# Patient Record
Sex: Male | Born: 1945 | Race: Black or African American | Hispanic: No | Marital: Married | Smoking: Former smoker
Health system: Southern US, Community
[De-identification: ages and names within clinical notes are randomized; demographics above are authoritative.]

## PROBLEM LIST (undated history)

## (undated) DIAGNOSIS — R7303 Prediabetes: Secondary | ICD-10-CM

## (undated) DIAGNOSIS — M549 Dorsalgia, unspecified: Secondary | ICD-10-CM

## (undated) DIAGNOSIS — Z972 Presence of dental prosthetic device (complete) (partial): Secondary | ICD-10-CM

## (undated) DIAGNOSIS — R12 Heartburn: Secondary | ICD-10-CM

## (undated) DIAGNOSIS — K219 Gastro-esophageal reflux disease without esophagitis: Secondary | ICD-10-CM

## (undated) DIAGNOSIS — N400 Enlarged prostate without lower urinary tract symptoms: Secondary | ICD-10-CM

## (undated) DIAGNOSIS — I10 Essential (primary) hypertension: Secondary | ICD-10-CM

## (undated) DIAGNOSIS — Z8249 Family history of ischemic heart disease and other diseases of the circulatory system: Secondary | ICD-10-CM

## (undated) DIAGNOSIS — N210 Calculus in bladder: Secondary | ICD-10-CM

## (undated) DIAGNOSIS — E785 Hyperlipidemia, unspecified: Secondary | ICD-10-CM

## (undated) DIAGNOSIS — E669 Obesity, unspecified: Secondary | ICD-10-CM

## (undated) DIAGNOSIS — E8881 Metabolic syndrome: Secondary | ICD-10-CM

## (undated) HISTORY — DX: Essential (primary) hypertension: I10

## (undated) HISTORY — PX: BACK SURGERY: SHX140

## (undated) HISTORY — DX: Dorsalgia, unspecified: M54.9

## (undated) HISTORY — PX: EYE SURGERY: SHX253

## (undated) HISTORY — DX: Hyperlipidemia, unspecified: E78.5

## (undated) HISTORY — DX: Heartburn: R12

## (undated) HISTORY — DX: Obesity, unspecified: E66.9

## (undated) HISTORY — DX: Metabolic syndrome: E88.81

---

## 1998-02-09 ENCOUNTER — Ambulatory Visit (HOSPITAL_COMMUNITY): Admission: RE | Admit: 1998-02-09 | Discharge: 1998-02-09 | Payer: Self-pay | Admitting: Internal Medicine

## 1998-03-23 ENCOUNTER — Ambulatory Visit (HOSPITAL_COMMUNITY): Admission: RE | Admit: 1998-03-23 | Discharge: 1998-03-23 | Payer: Self-pay | Admitting: Internal Medicine

## 1998-12-12 ENCOUNTER — Inpatient Hospital Stay (HOSPITAL_COMMUNITY): Admission: AD | Admit: 1998-12-12 | Discharge: 1998-12-13 | Payer: Self-pay | Admitting: Cardiovascular Disease

## 1998-12-13 ENCOUNTER — Encounter: Payer: Self-pay | Admitting: Cardiovascular Disease

## 2003-08-19 ENCOUNTER — Encounter: Admission: RE | Admit: 2003-08-19 | Discharge: 2003-08-19 | Payer: Self-pay | Admitting: Internal Medicine

## 2004-03-14 ENCOUNTER — Inpatient Hospital Stay (HOSPITAL_COMMUNITY): Admission: RE | Admit: 2004-03-14 | Discharge: 2004-03-17 | Payer: Self-pay | Admitting: Neurosurgery

## 2004-03-14 HISTORY — PX: LUMBAR FUSION: SHX111

## 2009-04-17 ENCOUNTER — Encounter: Admission: RE | Admit: 2009-04-17 | Discharge: 2009-04-17 | Payer: Self-pay | Admitting: Internal Medicine

## 2011-03-08 NOTE — Discharge Summary (Signed)
NAME:  Tyler [name], PETRUCELLI NO.:  [id number]   MEDICAL RECORD NO.:  [id number]                   PATIENT TYPE:  INP   LOCATION:  3028                                 FACILITY:  [facility]   PHYSICIAN:  Coletta Memos, M.D.                  DATE OF BIRTH:  [birth year]   DATE OF ADMISSION:  03/14/2004  DATE OF DISCHARGE:  03/17/2004                                 DISCHARGE SUMMARY   ADMITTING DIAGNOSES:  1. Lumbar spondylolisthesis, L5-S1.  2. L5 spondylolysis.  3. Lumbar degenerative disk disease.  4. Lumbar radiculopathy.   POSTOPERATIVE DIAGNOSES:  1. Lumbar spondylolisthesis, L5-S1.  2. L5 spondylolysis.  3. Lumbar degenerative disk disease.  4. Lumbar radiculopathy.   PROCEDURE:  L5-S1 posterior lumbar interbody fusion, posterolateral  arthrodesis, pedicle screw fixation.   COMPLICATIONS:  None.   DISCHARGE STATUS:  Alive and well.   MEDICATIONS:  1. Percocet 1-2 p.o. q.4h. p.r.n. pain.  2. Flexeril 10 mg p.o. t.i.d. p.r.n. pain.   INSTRUCTIONS GIVEN TO PATIENT:  Do not drive for 10 days.  He was instructed  to call my office for return appointment in 3-4 weeks.  Patient is  ambulating.  Wound is clean and dry without signs of infection.  He is  tolerating a regular diet and is voiding at discharge.                                                Coletta Memos, M.D.    KC/MEDQ  D:  03/17/2004  T:  03/18/2004  Job:  [id number]

## 2011-03-08 NOTE — Op Note (Signed)
NAME:  Tyler [name], Tyler [name]                   ACCOUNT NO.:  [id number]   MEDICAL RECORD NO.:  [id number]                   PATIENT TYPE:  INP   LOCATION:  2889                                 FACILITY:  [facility]   PHYSICIAN:  Coletta Memos, M.D.                  DATE OF BIRTH:  05-Aug-1946   DATE OF PROCEDURE:  03/14/2004  DATE OF DISCHARGE:                                 OPERATIVE REPORT   PREOPERATIVE DIAGNOSIS:  L5 spondylolisthesis, L5 spondylolysis, lumbar  radiculopathy, degenerative disk disease, L5-S1.   POSTOPERATIVE DIAGNOSIS:  L5 spondylolisthesis, L5 spondylolysis, lumbar  radiculopathy, degenerative disk disease, L5-S1.   OPERATION PERFORMED:  1. L5 Gill procedure.  2. Posterolateral arthrodesis with pedicle screw fixation, Synthes Legacy     system, one 6.5 x 45 mm screw on the right side at L5, one 5.5 x 45 mm     screw left L5, two 6.5 x 40 mm screws at S1.  Posterior lumbar interbody     arthrodesis with two 13 mm Synthes PEAK cages, morcelized auto and     allograft.   SURGEON:  Coletta Memos, M.D.   ASSISTANT:  Cristi Loron, M.D.   ANESTHESIA:  General.   INDICATIONS FOR PROCEDURE:  Fayrene Fearing Savoca presented with a long  history of back and lower extremity pain.  He says the pain has been getting  worse over a number of years.  He has a pars defect at L5 and a  spondylolisthesis grade 1 at L5 on S1.  I recommended and he agreed to  undergo operative decompression.   DESCRIPTION OF PROCEDURE:  Mr. Grisby was brought to the operating  room intubated and placed under general anesthetic.  His incision was  prepped and he was draped in sterile fashion.  Using preoperative localizing  film I infiltrated 20 mL 0.5% lidocaine 1:200,000 strength epinephrine.  I  opened the skin with a #10 blade and took this down to the thoracolumbar  fascia.  I then exposed L5-S1, the lamina of L3, spinous processes of L2.  I  performed a Gill procedure at L5 after  placing self-retaining retractors.  I  then decompressed both neural foramina to expose and make the egress widely  patent for the L5 nerve roots bilaterally.  Once that was done, I then  placed two 13 mm PLIF cages after performing bilateral diskectomies and  preparing the disk space for PLIF.  Morcelized auto and allograft was then  placed into the disk space after the 13 mm cages were placed.  Fluoroscopy  showed those to be in good position.  Then using a three dimensional  navigating system, which was integrated with fluoroscopy, four pedicle  screws were placed, two in L5, two in S1.  Dr. Lovell Sheehan assisted with this.  After that was done, an x-ray was taken showing that all screws were in good  position.  They were connected by 30 mm rods.  The  rods were then tightened  and locked into position.  I then irrigated the wound.  I then closed the  wound in layered fashion using Vicryl sutures.  Dermabond was used for a  sterile dressing.  There was some oozing afterwards so another dressing was  used on top of the Dermabond.                                              Coletta Memos, M.D.   KC/MEDQ  D:  03/14/2004  T:  03/15/2004  Job:  [id number]

## 2012-02-27 LAB — BASIC METABOLIC PANEL
BUN: 15 mg/dL (ref 4–21)
Potassium: 4.2 mmol/L (ref 3.4–5.3)
Sodium: 142 mmol/L (ref 137–147)

## 2012-10-21 HISTORY — PX: CATARACT EXTRACTION W/ INTRAOCULAR LENS IMPLANT: SHX1309

## 2012-12-02 ENCOUNTER — Encounter: Payer: Self-pay | Admitting: Hematology

## 2012-12-02 DIAGNOSIS — E669 Obesity, unspecified: Secondary | ICD-10-CM

## 2012-12-02 DIAGNOSIS — I10 Essential (primary) hypertension: Secondary | ICD-10-CM

## 2012-12-02 DIAGNOSIS — E88819 Insulin resistance, unspecified: Secondary | ICD-10-CM

## 2012-12-02 DIAGNOSIS — E785 Hyperlipidemia, unspecified: Secondary | ICD-10-CM

## 2012-12-02 DIAGNOSIS — R12 Heartburn: Secondary | ICD-10-CM

## 2012-12-02 DIAGNOSIS — E8881 Metabolic syndrome: Secondary | ICD-10-CM | POA: Insufficient documentation

## 2015-04-07 ENCOUNTER — Ambulatory Visit
Admission: RE | Admit: 2015-04-07 | Discharge: 2015-04-07 | Disposition: A | Discharge status: Home / Self Care | Payer: Self-pay | Source: Ambulatory Visit | Attending: Internal Medicine | Admitting: Internal Medicine

## 2015-04-07 ENCOUNTER — Other Ambulatory Visit: Payer: Self-pay | Admitting: Internal Medicine

## 2015-04-07 DIAGNOSIS — M79606 Pain in leg, unspecified: Secondary | ICD-10-CM

## 2015-04-07 DIAGNOSIS — M25552 Pain in left hip: Secondary | ICD-10-CM

## 2015-04-07 DIAGNOSIS — W19XXXA Unspecified fall, initial encounter: Secondary | ICD-10-CM

## 2015-04-28 ENCOUNTER — Other Ambulatory Visit: Payer: Self-pay | Admitting: Neurosurgery

## 2015-04-28 DIAGNOSIS — M545 Low back pain, unspecified: Secondary | ICD-10-CM

## 2015-05-10 ENCOUNTER — Ambulatory Visit
Admission: RE | Admit: 2015-05-10 | Discharge: 2015-05-10 | Disposition: A | Discharge status: Home / Self Care | Payer: Medicare Other | Source: Ambulatory Visit | Attending: Neurosurgery | Admitting: Neurosurgery

## 2015-05-10 DIAGNOSIS — M545 Low back pain, unspecified: Secondary | ICD-10-CM

## 2015-05-10 MED ORDER — GADOBENATE DIMEGLUMINE 529 MG/ML IV SOLN
20.0000 mL | Freq: Once | INTRAVENOUS | Status: AC | PRN
  Administered 2015-05-10: 20 mL via INTRAVENOUS

## 2015-05-11 ENCOUNTER — Other Ambulatory Visit: Payer: Self-pay | Admitting: Neurosurgery

## 2015-05-17 ENCOUNTER — Other Ambulatory Visit (HOSPITAL_COMMUNITY): Payer: Self-pay | Admitting: *Deleted

## 2015-05-17 ENCOUNTER — Encounter (HOSPITAL_COMMUNITY)
Admission: RE | Admit: 2015-05-17 | Discharge: 2015-05-17 | Disposition: A | Discharge status: Home / Self Care | Payer: Medicare Other | Source: Ambulatory Visit | Attending: Neurosurgery | Admitting: Neurosurgery

## 2015-05-17 ENCOUNTER — Encounter (HOSPITAL_COMMUNITY): Payer: Self-pay

## 2015-05-17 DIAGNOSIS — M79672 Pain in left foot: Secondary | ICD-10-CM | POA: Diagnosis not present

## 2015-05-17 DIAGNOSIS — Z981 Arthrodesis status: Secondary | ICD-10-CM | POA: Diagnosis not present

## 2015-05-17 DIAGNOSIS — Z79899 Other long term (current) drug therapy: Secondary | ICD-10-CM | POA: Diagnosis not present

## 2015-05-17 DIAGNOSIS — E785 Hyperlipidemia, unspecified: Secondary | ICD-10-CM | POA: Diagnosis not present

## 2015-05-17 DIAGNOSIS — I10 Essential (primary) hypertension: Secondary | ICD-10-CM | POA: Diagnosis not present

## 2015-05-17 DIAGNOSIS — Z6838 Body mass index (BMI) 38.0-38.9, adult: Secondary | ICD-10-CM | POA: Diagnosis not present

## 2015-05-17 DIAGNOSIS — M549 Dorsalgia, unspecified: Secondary | ICD-10-CM | POA: Diagnosis not present

## 2015-05-17 DIAGNOSIS — Z87891 Personal history of nicotine dependence: Secondary | ICD-10-CM | POA: Diagnosis not present

## 2015-05-17 DIAGNOSIS — M5126 Other intervertebral disc displacement, lumbar region: Secondary | ICD-10-CM | POA: Diagnosis not present

## 2015-05-17 LAB — BASIC METABOLIC PANEL
Anion gap: 8 (ref 5–15)
BUN: 11 mg/dL (ref 6–20)
CHLORIDE: 107 mmol/L (ref 101–111)
CO2: 26 mmol/L (ref 22–32)
CREATININE: 0.96 mg/dL (ref 0.61–1.24)
Calcium: 9.9 mg/dL (ref 8.9–10.3)
GFR calc non Af Amer: >60 mL/min (ref >60)
GLUCOSE: 125 mg/dL — AB (ref 65–99)
Potassium: 3.9 mmol/L (ref 3.5–5.1)
SODIUM: 141 mmol/L (ref 135–145)

## 2015-05-17 LAB — SURGICAL PCR SCREEN
MRSA, PCR: NEGATIVE
Staphylococcus aureus: NEGATIVE

## 2015-05-17 LAB — CBC
HCT: 43.7 % (ref 39.0–52.0)
Hemoglobin: 14.7 g/dL (ref 13.0–17.0)
MCH: 30.2 pg (ref 26.0–34.0)
MCHC: 33.6 g/dL (ref 30.0–36.0)
MCV: 89.9 fL (ref 78.0–100.0)
PLATELETS: 286 10*3/uL (ref 150–400)
RBC: 4.86 MIL/uL (ref 4.22–5.81)
RDW: 13.4 % (ref 11.5–15.5)
WBC: 5.9 10*3/uL (ref 4.0–10.5)

## 2015-05-17 NOTE — Progress Notes (Signed)
Anesthesia Chart Review:  Pt is 69 year old male scheduled for L4-5 lumbar laminectomy/ decompression microdiscectomy on 05/19/2015 with Dr. Franky Macho.   PCP is Dr. Willey Blade.   PMH includes: HTN, hyperlipidemia, insulin resistence. Former smoker. BMI 38.   Medications include: amlodipine, maxzide.   Preoperative labs reviewed.    EKG 05/17/2015: Sinus bradycardia (51 bpm). T wave abnormality, consider lateral ischemia. Since previous tracing 03/12/04 T abnl more obvious per Dr. Thomasene Lot interpretation.   Reviewed with Dr. Renold Don.   If no changes, I anticipate pt can proceed with surgery as scheduled.   Rica Mast, FNP-BC [facility]/Anesthesiology Phone: [phone] 05/17/2015 4:53 PM

## 2015-05-17 NOTE — Pre-Procedure Instructions (Addendum)
    Ishmael Berkovich Sharron  05/17/2015      Your procedure is scheduled on Friday, May 19, 2015 at 3:50 PM.   Report to [facility] Entrance "A" Admitting Office at 1:45 PM.   Call this number if you have problems the morning of surgery: [phone]     Remember:  Do not eat food or drink liquids after midnight Thursday, 05/18/15.  Take these medicines the morning of surgery with A SIP OF WATER: Amlodipine, Oxycodone - if needed    (stop aspirin, coumadin, plavix, effient, herbal medicines)   Do not wear jewelry.  Do not wear lotions, powders, or cologne.  You may wear deodorant.  Men may shave face and neck.  Do not bring valuables to the hospital.  [facility] is not responsible for any belongings or valuables.  Contacts, dentures or bridgework may not be worn into surgery.  Leave your suitcase in the car.  After surgery it may be brought to your room.  For patients admitted to the hospital, discharge time will be determined by your treatment team.  Special instructions:  See "Preparing for Surgery" Instruction sheet.   Please read over the following fact sheets that you were given. Pain Booklet, Coughing and Deep Breathing, MRSA Information and Surgical Site Infection Prevention

## 2015-05-17 NOTE — Progress Notes (Signed)
   05/17/15 0926  OBSTRUCTIVE SLEEP APNEA  Have you ever been diagnosed with sleep apnea through a sleep study? No  Do you snore loudly (loud enough to be heard through closed doors)?  0  Do you often feel tired, fatigued, or sleepy during the daytime? 0  Has anyone observed you stop breathing during your sleep? 0  Do you have, or are you being treated for high blood pressure? 1  BMI more than 35 kg/m2? 1  Age over 67 years old? 1  Neck circumference greater than 40 cm/16 inches? 1  Gender: 1

## 2015-05-18 MED ORDER — CEFAZOLIN SODIUM-DEXTROSE 2-3 GM-% IV SOLR: 2.0000 g | INTRAVENOUS | Status: DC

## 2015-05-18 MED ORDER — CEFAZOLIN SODIUM-DEXTROSE 2-3 GM-% IV SOLR
2.0000 g | INTRAVENOUS | Status: AC
  Administered 2015-05-19: 2 g via INTRAVENOUS
  Filled 2015-05-18: qty 50

## 2015-05-19 ENCOUNTER — Ambulatory Visit (HOSPITAL_COMMUNITY): Payer: Medicare Other

## 2015-05-19 ENCOUNTER — Ambulatory Visit (HOSPITAL_COMMUNITY)
Admission: RE | Admit: 2015-05-19 | Discharge: 2015-05-20 | Disposition: A | Discharge status: Home / Self Care | Payer: Medicare Other | Source: Ambulatory Visit | Attending: Neurosurgery | Admitting: Neurosurgery

## 2015-05-19 ENCOUNTER — Ambulatory Visit (HOSPITAL_COMMUNITY): Payer: Medicare Other | Admitting: Emergency Medicine

## 2015-05-19 ENCOUNTER — Encounter (HOSPITAL_COMMUNITY)
Admission: RE | Disposition: A | Discharge status: Home / Self Care | Payer: Self-pay | Source: Ambulatory Visit | Attending: Neurosurgery

## 2015-05-19 ENCOUNTER — Ambulatory Visit (HOSPITAL_COMMUNITY): Payer: Medicare Other | Admitting: Certified Registered Nurse Anesthetist

## 2015-05-19 ENCOUNTER — Encounter (HOSPITAL_COMMUNITY): Payer: Self-pay | Admitting: Certified Registered Nurse Anesthetist

## 2015-05-19 DIAGNOSIS — M5126 Other intervertebral disc displacement, lumbar region: Secondary | ICD-10-CM | POA: Diagnosis present

## 2015-05-19 DIAGNOSIS — Z87891 Personal history of nicotine dependence: Secondary | ICD-10-CM | POA: Insufficient documentation

## 2015-05-19 DIAGNOSIS — Z419 Encounter for procedure for purposes other than remedying health state, unspecified: Secondary | ICD-10-CM

## 2015-05-19 DIAGNOSIS — Z981 Arthrodesis status: Secondary | ICD-10-CM | POA: Insufficient documentation

## 2015-05-19 DIAGNOSIS — Z6838 Body mass index (BMI) 38.0-38.9, adult: Secondary | ICD-10-CM | POA: Insufficient documentation

## 2015-05-19 DIAGNOSIS — M549 Dorsalgia, unspecified: Secondary | ICD-10-CM | POA: Insufficient documentation

## 2015-05-19 DIAGNOSIS — E785 Hyperlipidemia, unspecified: Secondary | ICD-10-CM | POA: Diagnosis not present

## 2015-05-19 DIAGNOSIS — M79672 Pain in left foot: Secondary | ICD-10-CM | POA: Insufficient documentation

## 2015-05-19 DIAGNOSIS — I10 Essential (primary) hypertension: Secondary | ICD-10-CM | POA: Diagnosis not present

## 2015-05-19 DIAGNOSIS — Z79899 Other long term (current) drug therapy: Secondary | ICD-10-CM | POA: Insufficient documentation

## 2015-05-19 HISTORY — PX: LUMBAR LAMINECTOMY/DECOMPRESSION MICRODISCECTOMY: SHX5026

## 2015-05-19 SURGERY — LUMBAR LAMINECTOMY/DECOMPRESSION MICRODISCECTOMY 1 LEVEL
Anesthesia: General

## 2015-05-19 MED ORDER — SALINE SPRAY 0.65 % NA SOLN
1.0000 | NASAL | Status: DC | PRN
  Administered 2015-05-19: 1 via NASAL
  Filled 2015-05-19: qty 44

## 2015-05-19 MED ORDER — AMLODIPINE BESYLATE 5 MG PO TABS
5.0000 mg | ORAL_TABLET | Freq: Every day | ORAL | Status: DC
  Filled 2015-05-19: qty 1

## 2015-05-19 MED ORDER — GLYCOPYRROLATE 0.2 MG/ML IJ SOLN
INTRAMUSCULAR | Status: AC
  Filled 2015-05-19: qty 9

## 2015-05-19 MED ORDER — FENTANYL CITRATE (PF) 250 MCG/5ML IJ SOLN
INTRAMUSCULAR | Status: AC
  Filled 2015-05-19: qty 5

## 2015-05-19 MED ORDER — PHENYLEPHRINE 40 MCG/ML (10ML) SYRINGE FOR IV PUSH (FOR BLOOD PRESSURE SUPPORT)
PREFILLED_SYRINGE | INTRAVENOUS | Status: AC
  Filled 2015-05-19: qty 10

## 2015-05-19 MED ORDER — LACTATED RINGERS IV SOLN: INTRAVENOUS | Status: DC

## 2015-05-19 MED ORDER — SODIUM CHLORIDE 0.9 % IJ SOLN: 3.0000 mL | Freq: Two times a day (BID) | INTRAMUSCULAR | Status: DC

## 2015-05-19 MED ORDER — SODIUM CHLORIDE 0.9 % IJ SOLN: 3.0000 mL | INTRAMUSCULAR | Status: DC | PRN

## 2015-05-19 MED ORDER — ARTIFICIAL TEARS OP OINT
TOPICAL_OINTMENT | OPHTHALMIC | Status: DC | PRN
  Administered 2015-05-19: 1 via OPHTHALMIC

## 2015-05-19 MED ORDER — LIDOCAINE HCL (CARDIAC) 20 MG/ML IV SOLN
INTRAVENOUS | Status: AC
  Filled 2015-05-19: qty 5

## 2015-05-19 MED ORDER — HYDROMORPHONE HCL 1 MG/ML IJ SOLN
0.2500 mg | INTRAMUSCULAR | Status: DC | PRN
  Administered 2015-05-19 (×2): 0.5 mg via INTRAVENOUS

## 2015-05-19 MED ORDER — NEOSTIGMINE METHYLSULFATE 10 MG/10ML IV SOLN
INTRAVENOUS | Status: AC
  Filled 2015-05-19: qty 5

## 2015-05-19 MED ORDER — LACTATED RINGERS IV SOLN
INTRAVENOUS | Status: DC | PRN
  Administered 2015-05-19 (×2): via INTRAVENOUS

## 2015-05-19 MED ORDER — TRIAMTERENE-HCTZ 37.5-25 MG PO TABS
1.0000 | ORAL_TABLET | Freq: Every day | ORAL | Status: DC
  Administered 2015-05-19: 1 via ORAL
  Filled 2015-05-19 (×2): qty 1

## 2015-05-19 MED ORDER — LACTATED RINGERS IV SOLN
INTRAVENOUS | Status: DC
  Administered 2015-05-19: 14:00:00 via INTRAVENOUS

## 2015-05-19 MED ORDER — ONDANSETRON HCL 4 MG/2ML IJ SOLN: 4.0000 mg | INTRAMUSCULAR | Status: DC | PRN

## 2015-05-19 MED ORDER — THROMBIN 5000 UNITS EX SOLR
CUTANEOUS | Status: DC | PRN
  Administered 2015-05-19 (×2): 5000 [IU] via TOPICAL

## 2015-05-19 MED ORDER — LIDOCAINE-EPINEPHRINE 0.5 %-1:200000 IJ SOLN
INTRAMUSCULAR | Status: DC | PRN
  Administered 2015-05-19: 10 mL

## 2015-05-19 MED ORDER — DIAZEPAM 5 MG PO TABS
5.0000 mg | ORAL_TABLET | Freq: Four times a day (QID) | ORAL | Status: DC | PRN
  Administered 2015-05-19: 5 mg via ORAL
  Filled 2015-05-19: qty 1

## 2015-05-19 MED ORDER — ONDANSETRON HCL 4 MG/2ML IJ SOLN
INTRAMUSCULAR | Status: AC
  Filled 2015-05-19: qty 2

## 2015-05-19 MED ORDER — POTASSIUM CHLORIDE IN NACL 20-0.9 MEQ/L-% IV SOLN
INTRAVENOUS | Status: DC
  Filled 2015-05-19 (×3): qty 1000

## 2015-05-19 MED ORDER — ADULT MULTIVITAMIN W/MINERALS CH
1.0000 | ORAL_TABLET | Freq: Every day | ORAL | Status: DC
  Filled 2015-05-19: qty 1

## 2015-05-19 MED ORDER — CYANOCOBALAMIN 500 MCG PO TABS
500.0000 ug | ORAL_TABLET | Freq: Every day | ORAL | Status: DC
  Filled 2015-05-19: qty 1

## 2015-05-19 MED ORDER — KETOROLAC TROMETHAMINE 30 MG/ML IJ SOLN
30.0000 mg | Freq: Four times a day (QID) | INTRAMUSCULAR | Status: DC
  Administered 2015-05-19 – 2015-05-20 (×2): 30 mg via INTRAVENOUS
  Filled 2015-05-19 (×6): qty 1

## 2015-05-19 MED ORDER — MIDAZOLAM HCL 2 MG/2ML IJ SOLN
INTRAMUSCULAR | Status: AC
  Filled 2015-05-19: qty 2

## 2015-05-19 MED ORDER — NEOSTIGMINE METHYLSULFATE 10 MG/10ML IV SOLN
INTRAVENOUS | Status: AC
  Filled 2015-05-19: qty 1

## 2015-05-19 MED ORDER — PHENOL 1.4 % MT LIQD: 1.0000 | OROMUCOSAL | Status: DC | PRN

## 2015-05-19 MED ORDER — ARTIFICIAL TEARS OP OINT
TOPICAL_OINTMENT | OPHTHALMIC | Status: AC
  Filled 2015-05-19: qty 7

## 2015-05-19 MED ORDER — 0.9 % SODIUM CHLORIDE (POUR BTL) OPTIME
TOPICAL | Status: DC | PRN
  Administered 2015-05-19: 1000 mL

## 2015-05-19 MED ORDER — PROPOFOL 10 MG/ML IV BOLUS
INTRAVENOUS | Status: DC | PRN
  Administered 2015-05-19: 200 mg via INTRAVENOUS

## 2015-05-19 MED ORDER — CALCIUM CARBONATE-VITAMIN D 500-200 MG-UNIT PO TABS
1.0000 | ORAL_TABLET | Freq: Two times a day (BID) | ORAL | Status: DC
  Administered 2015-05-19: 1 via ORAL
  Filled 2015-05-19 (×3): qty 1

## 2015-05-19 MED ORDER — ONDANSETRON HCL 4 MG/2ML IJ SOLN
INTRAMUSCULAR | Status: DC | PRN
  Administered 2015-05-19: 4 mg via INTRAVENOUS

## 2015-05-19 MED ORDER — PROMETHAZINE HCL 25 MG/ML IJ SOLN: 6.2500 mg | INTRAMUSCULAR | Status: DC | PRN

## 2015-05-19 MED ORDER — OXYCODONE-ACETAMINOPHEN 5-325 MG PO TABS
1.0000 | ORAL_TABLET | ORAL | Status: DC | PRN
  Administered 2015-05-19: 2 via ORAL

## 2015-05-19 MED ORDER — MIDAZOLAM HCL 5 MG/5ML IJ SOLN
INTRAMUSCULAR | Status: DC | PRN
  Administered 2015-05-19: 2 mg via INTRAVENOUS

## 2015-05-19 MED ORDER — ARTIFICIAL TEARS OP OINT
TOPICAL_OINTMENT | OPHTHALMIC | Status: AC
  Filled 2015-05-19: qty 3.5

## 2015-05-19 MED ORDER — PROPOFOL 10 MG/ML IV BOLUS
INTRAVENOUS | Status: AC
  Filled 2015-05-19: qty 20

## 2015-05-19 MED ORDER — HYDROCODONE-ACETAMINOPHEN 5-325 MG PO TABS
1.0000 | ORAL_TABLET | ORAL | Status: DC | PRN
  Administered 2015-05-19 – 2015-05-20 (×3): 2 via ORAL
  Filled 2015-05-19 (×3): qty 2

## 2015-05-19 MED ORDER — MENTHOL 3 MG MT LOZG: 1.0000 | LOZENGE | OROMUCOSAL | Status: DC | PRN

## 2015-05-19 MED ORDER — ROCURONIUM BROMIDE 100 MG/10ML IV SOLN
INTRAVENOUS | Status: DC | PRN
  Administered 2015-05-19: 50 mg via INTRAVENOUS

## 2015-05-19 MED ORDER — MORPHINE SULFATE 2 MG/ML IJ SOLN: 1.0000 mg | INTRAMUSCULAR | Status: DC | PRN

## 2015-05-19 MED ORDER — FENTANYL CITRATE (PF) 100 MCG/2ML IJ SOLN
INTRAMUSCULAR | Status: DC | PRN
  Administered 2015-05-19: 125 ug via INTRAVENOUS
  Administered 2015-05-19: 50 ug via INTRAVENOUS
  Administered 2015-05-19: 75 ug via INTRAVENOUS

## 2015-05-19 MED ORDER — ROCURONIUM BROMIDE 50 MG/5ML IV SOLN
INTRAVENOUS | Status: AC
  Filled 2015-05-19: qty 1

## 2015-05-19 MED ORDER — PHENYLEPHRINE HCL 10 MG/ML IJ SOLN
INTRAMUSCULAR | Status: DC | PRN
  Administered 2015-05-19: 120 ug via INTRAVENOUS

## 2015-05-19 MED ORDER — ACETAMINOPHEN 325 MG PO TABS: 650.0000 mg | ORAL_TABLET | ORAL | Status: DC | PRN

## 2015-05-19 MED ORDER — HYDROMORPHONE HCL 1 MG/ML IJ SOLN
INTRAMUSCULAR | Status: AC
  Filled 2015-05-19: qty 1

## 2015-05-19 MED ORDER — ACETAMINOPHEN 650 MG RE SUPP
650.0000 mg | RECTAL | Status: DC | PRN
  Filled 2015-05-19: qty 1

## 2015-05-19 MED ORDER — NEOSTIGMINE METHYLSULFATE 10 MG/10ML IV SOLN
INTRAVENOUS | Status: DC | PRN
  Administered 2015-05-19: 4 mg via INTRAVENOUS

## 2015-05-19 MED ORDER — HEMOSTATIC AGENTS (NO CHARGE) OPTIME
TOPICAL | Status: DC | PRN
  Administered 2015-05-19: 1 via TOPICAL

## 2015-05-19 MED ORDER — OXYCODONE-ACETAMINOPHEN 5-325 MG PO TABS
ORAL_TABLET | ORAL | Status: AC
  Filled 2015-05-19: qty 2

## 2015-05-19 MED ORDER — LIDOCAINE HCL (CARDIAC) 20 MG/ML IV SOLN
INTRAVENOUS | Status: DC | PRN
  Administered 2015-05-19: 80 mg via INTRAVENOUS

## 2015-05-19 MED ORDER — GLYCOPYRROLATE 0.2 MG/ML IJ SOLN
INTRAMUSCULAR | Status: DC | PRN
  Administered 2015-05-19: 0.6 mg via INTRAVENOUS

## 2015-05-19 SURGICAL SUPPLY — 52 items
APL SKNCLS STERI-STRIP NONHPOA (GAUZE/BANDAGES/DRESSINGS)
BAG DECANTER FOR FLEXI CONT (MISCELLANEOUS) ×3 IMPLANT
BENZOIN TINCTURE PRP APPL 2/3 (GAUZE/BANDAGES/DRESSINGS) IMPLANT
BLADE CLIPPER SURG (BLADE) IMPLANT
BUR MATCHSTICK NEURO 3.0 LAGG (BURR) ×3 IMPLANT
CANISTER SUCT 3000ML PPV (MISCELLANEOUS) ×3 IMPLANT
CLOSURE WOUND 1/2 X4 (GAUZE/BANDAGES/DRESSINGS)
CONT SPEC 4OZ CLIKSEAL STRL BL (MISCELLANEOUS) ×3 IMPLANT
DECANTER SPIKE VIAL GLASS SM (MISCELLANEOUS) ×3 IMPLANT
DRAPE LAPAROTOMY 100X72X124 (DRAPES) ×3 IMPLANT
DRAPE MICROSCOPE LEICA (MISCELLANEOUS) ×3 IMPLANT
DRAPE POUCH INSTRU U-SHP 10X18 (DRAPES) ×3 IMPLANT
DRAPE SURG 17X23 STRL (DRAPES) ×3 IMPLANT
DURAPREP 26ML APPLICATOR (WOUND CARE) ×3 IMPLANT
ELECT REM PT RETURN 9FT ADLT (ELECTROSURGICAL) ×3
ELECTRODE REM PT RTRN 9FT ADLT (ELECTROSURGICAL) ×1 IMPLANT
GAUZE SPONGE 4X4 12PLY STRL (GAUZE/BANDAGES/DRESSINGS) IMPLANT
GAUZE SPONGE 4X4 16PLY XRAY LF (GAUZE/BANDAGES/DRESSINGS) IMPLANT
GLOVE ECLIPSE 6.5 STRL STRAW (GLOVE) ×3 IMPLANT
GLOVE EXAM NITRILE LRG STRL (GLOVE) IMPLANT
GLOVE EXAM NITRILE MD LF STRL (GLOVE) IMPLANT
GLOVE EXAM NITRILE XL STR (GLOVE) IMPLANT
GLOVE EXAM NITRILE XS STR PU (GLOVE) IMPLANT
GLOVE INDICATOR 7.5 STRL GRN (GLOVE) ×2 IMPLANT
GLOVE SURG SS PI 7.0 STRL IVOR (GLOVE) ×8 IMPLANT
GOWN STRL REUS W/ TWL LRG LVL3 (GOWN DISPOSABLE) ×2 IMPLANT
GOWN STRL REUS W/ TWL XL LVL3 (GOWN DISPOSABLE) IMPLANT
GOWN STRL REUS W/TWL 2XL LVL3 (GOWN DISPOSABLE) IMPLANT
GOWN STRL REUS W/TWL LRG LVL3 (GOWN DISPOSABLE) ×6
GOWN STRL REUS W/TWL XL LVL3 (GOWN DISPOSABLE) ×3
KIT BASIN OR (CUSTOM PROCEDURE TRAY) ×3 IMPLANT
KIT ROOM TURNOVER OR (KITS) ×3 IMPLANT
LIQUID BAND (GAUZE/BANDAGES/DRESSINGS) ×3 IMPLANT
NDL HYPO 25X1 1.5 SAFETY (NEEDLE) ×1 IMPLANT
NDL SPNL 18GX3.5 QUINCKE PK (NEEDLE) IMPLANT
NEEDLE HYPO 25X1 1.5 SAFETY (NEEDLE) ×3 IMPLANT
NEEDLE SPNL 18GX3.5 QUINCKE PK (NEEDLE) ×3 IMPLANT
NS IRRIG 1000ML POUR BTL (IV SOLUTION) ×3 IMPLANT
PACK LAMINECTOMY NEURO (CUSTOM PROCEDURE TRAY) ×3 IMPLANT
PAD ARMBOARD 7.5X6 YLW CONV (MISCELLANEOUS) ×9 IMPLANT
RUBBERBAND STERILE (MISCELLANEOUS) ×6 IMPLANT
SPONGE LAP 4X18 X RAY DECT (DISPOSABLE) IMPLANT
SPONGE SURGIFOAM ABS GEL SZ50 (HEMOSTASIS) ×3 IMPLANT
STRIP CLOSURE SKIN 1/2X4 (GAUZE/BANDAGES/DRESSINGS) IMPLANT
SUT VIC AB 0 CT1 18XCR BRD8 (SUTURE) ×1 IMPLANT
SUT VIC AB 0 CT1 8-18 (SUTURE) ×6
SUT VIC AB 2-0 CT1 18 (SUTURE) ×3 IMPLANT
SUT VIC AB 3-0 SH 8-18 (SUTURE) ×5 IMPLANT
SYR 20ML ECCENTRIC (SYRINGE) ×3 IMPLANT
TOWEL OR 17X24 6PK STRL BLUE (TOWEL DISPOSABLE) ×3 IMPLANT
TOWEL OR 17X26 10 PK STRL BLUE (TOWEL DISPOSABLE) ×3 IMPLANT
WATER STERILE IRR 1000ML POUR (IV SOLUTION) ×3 IMPLANT

## 2015-05-19 NOTE — Transfer of Care (Signed)
Immediate Anesthesia Transfer of Care Note  Patient: Tyler [name]  Procedure(s) Performed: Procedure(s) with comments: Lumbar four- five diskectomy (N/A) - L45 diskectomy  Patient Location: PACU  Anesthesia Type:General  Level of Consciousness: awake and alert   Airway & Oxygen Therapy: Patient Spontanous Breathing and Patient connected to nasal cannula oxygen  Post-op Assessment: Report given to RN, Post -op Vital signs reviewed and stable and Patient moving all extremities X 4  Post vital signs: Reviewed and stable  Last Vitals:  Filed Vitals:   05/19/15 1703  BP: 140/99  Pulse: 48  Temp: 36.6 C  Resp: 16    Complications: No apparent anesthesia complications

## 2015-05-19 NOTE — H&P (Signed)
  BP 164/76 mmHg  Pulse 57  Temp(Src) 97.6 F (36.4 C)  Resp 20  SpO2 97% Tyler [name] who had recently been in, took another fall on to the left hip, and took the full force of impact onto that left hip. He now is complaining of pain traveling from his buttocks into the left foot, and under the surface of his left foot. This happened on Wednesday before Memorial Day. He was given a steroid taper by Dr. Willey Blade which had not seemed to have helped. He is taking antiinflammatories also. He was placed on Vicodin by one of my partners in my absence. Tyler [name]'s pain is still severe, it has not improved. At this point, I think we will proceed with an MRI. I did look at x-rays which he has already had after this fall, and it shows that all of the hardware is intact, and he has a solid interbody fusion.  PHYSICAL EXAMINATION: Vital signs height 5 feet 6 inches, weight 236.2 pounds, blood pressure is 163/79, pulse is 64, respiratory rate 14, temperature 97.8. Pain is 8/10.  On examination he had 2+ reflexes at the knees and ankles, downgoing toes. Negative Romberg. He has markedly antalgic gait. Strength is essentially full, but there is some pain limited limitations in the left hip flexors and extensors.  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 He says he has just been running things from home. He says essentially he is bedridden, as it just causes so much pain when he is up. Tyler [name] comes in today with a repeat MRI of the lumbar spine.  DATA: What it shows is that he has a fairly large herniated disc at L4-5 eccentric to the left side just above the level of his fusion. IMPRESSION/PLAN: We discussed things are length and for right now he would like to just try a simple discectomy. I told him, however, if the facet, which does look somewhat degenerated on the MRI at L4-5 on the left side, is a bad or worse than what  it might appear to be then I told him I think we should proceed with an arthrodesis. He understands. We will get this scheduled for next week Friday. Tyler [name] would like to just try a simple discectomy initially. He understands that I may have to convert this to a PLIF at the time of the operation or may have to do a PLIF after the operation. Knowing the risks, bleeding, infection, no relief, need for further surgery he would like to proceed and we will try to get this done next week Friday.

## 2015-05-19 NOTE — Anesthesia Postprocedure Evaluation (Signed)
  Anesthesia Post-op Note  Patient: Tyler [name]  Procedure(s) Performed: Procedure(s) with comments: Lumbar four- five diskectomy (N/A) - L45 diskectomy  Patient Location: PACU  Anesthesia Type: General   Level of Consciousness: awake, alert  and oriented  Airway and Oxygen Therapy: Patient Spontanous Breathing  Post-op Pain: mild  Post-op Assessment: Post-op Vital signs reviewed  Post-op Vital Signs: Reviewed  Last Vitals:  Filed Vitals:   05/19/15 1803  BP: 176/78  Pulse: 46  Temp: 36.5 C  Resp: 20    Complications: No apparent anesthesia complications

## 2015-05-19 NOTE — Op Note (Signed)
05/19/2015  4:55 PM  PATIENT:  Graceson Nichelson Rebstock  69 y.o. male with a large herniated disc at L4/5 on the left, just above his fusion at L5/S1. He decided that he wanted to try a discetomy v. A fusion at this time.   PRE-OPERATIVE DIAGNOSIS:  lumbar herniated disc L4/5 left  POST-OPERATIVE DIAGNOSIS:  lumbar herniated disc L4/5 left  PROCEDURE:  Procedure(s):left Lumbar four- five diskectomy  SURGEON:   Surgeon(s): Coletta Memos, MD Tia Alert, MD  ASSISTANTS:jones, david  ANESTHESIA:   general  EBL:  Total I/O In: 1500 [I.V.:1500] Out: 30 [Blood:30]  BLOOD ADMINISTERED:none  CELL SAVER GIVEN:none  COUNT:per nursing  DRAINS: none   SPECIMEN:  No Specimen  DICTATION: Mr. Mccaskey was taken to the operating room, intubated and placed under a general anesthetic without difficulty. He was positioned prone on a Wilson frame with all pressure points padded. His back was prepped and draped in a sterile manner. I opened the skin with a 10 blade and carried the dissection down to the thoracolumbar fascia. I used both sharp dissection and the monopolar cautery to expose the lamina of L4. I confirmed my location with an intraoperative xray.  I used the drill, Kerrison punches, and curettes to perform a semihemilaminectomy of L4 on the left. I used the punches to remove the ligamentum flavum to expose the thecal sac. I brought the microscope into the operative field and with Dr.Jones' assistance we started our decompression of the spinal canal, thecal sac and left L5 root(s). I cauterized epidural veins overlying the disc space then divided them sharply. I opened the disc space with a 15 blade and proceeded with the discectomy. I used pituitary rongeurs, curettes, and other instruments to remove disc material. After the discectomy was completed we inspected the L5 nerve root and felt it was well decompressed. I explored rostrally, laterally, medially, and caudally and was satisfied  with the decompression. I irrigated the wound, then closed in layers. I approximated the thoracolumbar fascia, subcutaneous, and subcuticular planes with vicryl sutures. I used dermabond for a sterile dressing.   PLAN OF CARE: Admit for overnight observation  PATIENT DISPOSITION:  PACU - hemodynamically stable.   Delay start of Pharmacological VTE agent (>24hrs) due to surgical blood loss or risk of bleeding:  yes

## 2015-05-19 NOTE — Anesthesia Preprocedure Evaluation (Addendum)
Anesthesia Evaluation  Patient identified by MRN, date of birth, ID band Patient awake    Reviewed: Allergy & Precautions, NPO status , Patient's Chart, lab work & pertinent test results  History of Anesthesia Complications Negative for: history of anesthetic complications  Airway Mallampati: II  TM Distance: >3 FB Neck ROM: Full    Dental  (+) Teeth Intact, Dental Advisory Given, Partial Upper   Pulmonary former smoker,  breath sounds clear to auscultation        Cardiovascular hypertension, Rhythm:Regular Rate:Normal  Abn EKG , denies cardiac problems   Neuro/Psych negative neurological ROS     GI/Hepatic GERD-  ,  Endo/Other  Morbid obesity  Renal/GU      Musculoskeletal   Abdominal (+) + obese,   Peds  Hematology   Anesthesia Other Findings   Reproductive/Obstetrics                           Anesthesia Physical Anesthesia Plan  ASA: III  Anesthesia Plan: General   Post-op Pain Management:    Induction: Intravenous  Airway Management Planned: Oral ETT  Additional Equipment:   Intra-op Plan:   Post-operative Plan: Extubation in OR  Informed Consent: I have reviewed the patients History and Physical, chart, labs and discussed the procedure including the risks, benefits and alternatives for the proposed anesthesia with the patient or authorized representative who has indicated his/her understanding and acceptance.   Dental advisory given  Plan Discussed with: CRNA and Surgeon  Anesthesia Plan Comments:         Anesthesia Quick Evaluation

## 2015-05-19 NOTE — Anesthesia Procedure Notes (Signed)
Procedure Name: Intubation Date/Time: 05/19/2015 2:57 PM Performed by: Rise Patience T Pre-anesthesia Checklist: Patient identified, Emergency Drugs available, Suction available and Patient being monitored Patient Re-evaluated:Patient Re-evaluated prior to inductionOxygen Delivery Method: Circle system utilized Preoxygenation: Pre-oxygenation with 100% oxygen Intubation Type: IV induction Ventilation: Mask ventilation without difficulty and Oral airway inserted - appropriate to patient size Laryngoscope Size: Hyacinth Meeker and 2 Grade View: Grade I Tube type: Oral Tube size: 8.0 mm Number of attempts: 1 Airway Equipment and Method: Stylet and Oral airway Placement Confirmation: ETT inserted through vocal cords under direct vision,  positive ETCO2 and breath sounds checked- equal and bilateral Secured at: 23 cm Tube secured with: Tape Dental Injury: Teeth and Oropharynx as per pre-operative assessment

## 2015-05-19 NOTE — Plan of Care (Signed)
Problem: Consults Goal: Diagnosis - Spinal Surgery Outcome: Completed/Met Date Met:  05/19/15 Microdiscectomy

## 2015-05-20 DIAGNOSIS — M5126 Other intervertebral disc displacement, lumbar region: Secondary | ICD-10-CM | POA: Diagnosis not present

## 2015-05-20 MED ORDER — METHOCARBAMOL 500 MG PO TABS: 500.0000 mg | ORAL_TABLET | Freq: Four times a day (QID) | ORAL | Status: DC

## 2015-05-20 NOTE — Discharge Summary (Signed)
Physician Discharge Summary  Patient ID: Tyler [name] MRN: [id number] DOB/AGE: 67-20-1947 67 y.o.  Admit date: 05/19/2015 Discharge date: 05/20/2015  Admission Diagnoses:Herniated lumbar disc L 45 Left  Discharge Diagnoses: Same Active Problems:   HNP (herniated nucleus pulposus), lumbar   Discharged Condition: good  Hospital Course: Uncomplicated Left L 45 microdiscectomy with uncomplicated recovery  Consults: None  Significant Diagnostic Studies: None  Treatments: surgery: Uncomplicated Left L 45 microdiscectomy  Discharge Exam: Blood pressure 122/54, pulse 45, temperature 98.1 F (36.7 C), temperature source Oral, resp. rate 18, SpO2 94 %. Neurologic: Alert and oriented X 3, normal strength and tone. Normal symmetric reflexes. Normal coordination and gait Wound:CDI  Disposition: Home     Medication List    TAKE these medications        amLODipine 5 MG tablet  Commonly known as:  NORVASC  Take 5 mg by mouth daily.     calcium citrate-vitamin D 315-200 MG-UNIT per tablet  Commonly known as:  CITRACAL+D  Take 1 tablet by mouth 2 (two) times daily.     cyanocobalamin 500 MCG tablet  Take 500 mcg by mouth daily.     multivitamin-iron-minerals-folic acid chewable tablet  Chew 1 tablet by mouth daily.     oxyCODONE-acetaminophen 5-325 MG per tablet  Commonly known as:  PERCOCET/ROXICET  Take 1 tablet by mouth every 6 (six) hours as needed for severe pain.     triamterene-hydrochlorothiazide 37.5-25 MG per tablet  Commonly known as:  MAXZIDE-25  Take 1 tablet by mouth daily.         Signed: Dorian Heckle, MD 05/20/2015, 6:32 AM

## 2015-05-20 NOTE — Discharge Instructions (Signed)
Wound Care °Leave incision open to air. °You may shower. °Do not scrub directly on incision.  °Do not put any creams, lotions, or ointments on incision. °Activity °Walk each and every day, increasing distance each day. °No lifting greater than 5 lbs.  Avoid bending, arching, and twisting. °No driving for 2 weeks; may ride as a passenger locally. ° °Diet °Resume your normal diet.  °Return to Work °Will be discussed at you follow up appointment. °Call Your Doctor If Any of These Occur °Redness, drainage, or swelling at the wound.  °Temperature greater than 101 degrees. °Severe pain not relieved by pain medication. °Incision starts to come apart. °Follow Up Appt °Call today for appointment in 4 weeks ([phone]) or for problems.  If you have any hardware placed in your spine, you will need an x-ray before your appointment. °

## 2015-05-20 NOTE — Progress Notes (Signed)
Patient alert and oriented, mae's well, voiding adequate amount of urine, swallowing without difficulty, c/o pain and medication given prior to discharged. Patient discharged home with family. Script and discharged instructions given to patient. Patient and family stated understanding of d/c instructions given and has an appointment with MD.  

## 2015-05-20 NOTE — Progress Notes (Signed)
Subjective: Patient reports doing great  Objective: Vital signs in last 24 hours: Temp:  [97.6 F (36.4 C)-98.1 F (36.7 C)] 98.1 F (36.7 C) (07/30 0500) Pulse Rate:  [44-57] 45 (07/30 0500) Resp:  [11-20] 18 (07/30 0500) BP: (122-176)/(48-99) 122/54 mmHg (07/30 0500) SpO2:  [94 %-100 %] 94 % (07/30 0500)  Intake/Output from previous day: 07/29 0701 - 07/30 0700 In: 1800 [P.O.:100; I.V.:1700] Out: 30 [Blood:30] Intake/Output this shift: Total I/O In: 100 [P.O.:100] Out: -   Physical Exam: Strength full.  Dressing mild drainage.  Lab Results:  Recent Labs  05/17/15 0948  WBC 5.9  HGB 14.7  HCT 43.7  PLT 286   BMET  Recent Labs  05/17/15 0948  NA 141  K 3.9  CL 107  CO2 26  GLUCOSE 125*  BUN 11  CREATININE 0.96  CALCIUM 9.9    Studies/Results: Dg Lumbar Spine 2-3 Views  05/19/2015   CLINICAL DATA:  L4-5 lumbar laminectomy  EXAM: LUMBAR SPINE - 2-3 VIEW  COMPARISON:  05/10/2015  FINDINGS: Two lateral views of the lumbar spine were obtained. The initial images demonstrate interbody fusion and posterior fixation at L5-S1. Vacuum disc phenomenon is noted at L4-5. A needle is noted within the posterior soft tissues at the L3-4 level. Subsequent image was obtained and shows surgical instruments just inferior to the L4-5 level along the posterior elements.  IMPRESSION: Intraoperative localization at L4-5.   Electronically Signed   By: Alcide Clever M.D.   On: 05/19/2015 15:49    Assessment/Plan: Discharge home.       Dorian Heckle, MD 05/20/2015, 6:31 AM

## 2015-05-22 ENCOUNTER — Encounter (HOSPITAL_COMMUNITY): Payer: Self-pay | Admitting: Neurosurgery

## 2015-08-10 ENCOUNTER — Ambulatory Visit (INDEPENDENT_AMBULATORY_CARE_PROVIDER_SITE_OTHER): Payer: Medicare Other | Admitting: Neurology

## 2015-08-10 DIAGNOSIS — J309 Allergic rhinitis, unspecified: Secondary | ICD-10-CM | POA: Diagnosis not present

## 2015-08-10 NOTE — Progress Notes (Signed)
Patient came in to start injections at 2:00pm. Patient was given a copy of injection room hours and instructions on allergy injection schedule and vial information. Patient was given Blue vial 1:100,000 with Mold-CR 0.05 given in right arm; Weeds-Tree 0.05 given in left upper arm; Grass-Dmite-Cat-Dog 0.05 given in left lower arm. Patient waited 30 minutes with no reaction to injections in clinic.

## 2015-08-12 ENCOUNTER — Other Ambulatory Visit: Payer: Self-pay | Admitting: Neurosurgery

## 2015-08-12 DIAGNOSIS — Z1231 Encounter for screening mammogram for malignant neoplasm of breast: Secondary | ICD-10-CM

## 2015-08-14 ENCOUNTER — Ambulatory Visit (INDEPENDENT_AMBULATORY_CARE_PROVIDER_SITE_OTHER): Payer: Medicare Other | Admitting: Neurology

## 2015-08-14 DIAGNOSIS — J309 Allergic rhinitis, unspecified: Secondary | ICD-10-CM | POA: Diagnosis not present

## 2015-08-16 ENCOUNTER — Ambulatory Visit (INDEPENDENT_AMBULATORY_CARE_PROVIDER_SITE_OTHER): Payer: Medicare Other | Admitting: Neurology

## 2015-08-16 DIAGNOSIS — J309 Allergic rhinitis, unspecified: Secondary | ICD-10-CM

## 2015-08-18 ENCOUNTER — Ambulatory Visit
Admission: RE | Admit: 2015-08-18 | Discharge: 2015-08-18 | Disposition: A | Discharge status: Home / Self Care | Payer: Medicare Other | Source: Ambulatory Visit | Attending: Neurosurgery | Admitting: Neurosurgery

## 2015-08-18 ENCOUNTER — Other Ambulatory Visit: Payer: Self-pay | Admitting: Neurosurgery

## 2015-08-18 DIAGNOSIS — Z1231 Encounter for screening mammogram for malignant neoplasm of breast: Secondary | ICD-10-CM

## 2015-08-18 MED ORDER — GADOBENATE DIMEGLUMINE 529 MG/ML IV SOLN
20.0000 mL | Freq: Once | INTRAVENOUS | Status: AC | PRN
  Administered 2015-08-18: 20 mL via INTRAVENOUS

## 2015-08-22 ENCOUNTER — Other Ambulatory Visit: Payer: Self-pay | Admitting: Neurosurgery

## 2015-08-23 ENCOUNTER — Ambulatory Visit (INDEPENDENT_AMBULATORY_CARE_PROVIDER_SITE_OTHER): Payer: Medicare Other | Admitting: Neurology

## 2015-08-23 DIAGNOSIS — J309 Allergic rhinitis, unspecified: Secondary | ICD-10-CM | POA: Diagnosis not present

## 2015-08-25 ENCOUNTER — Ambulatory Visit (INDEPENDENT_AMBULATORY_CARE_PROVIDER_SITE_OTHER): Payer: Medicare Other | Admitting: *Deleted

## 2015-08-25 ENCOUNTER — Other Ambulatory Visit (HOSPITAL_COMMUNITY): Payer: Self-pay | Admitting: *Deleted

## 2015-08-25 ENCOUNTER — Encounter (HOSPITAL_COMMUNITY): Payer: Self-pay

## 2015-08-25 ENCOUNTER — Encounter (HOSPITAL_COMMUNITY)
Admission: RE | Admit: 2015-08-25 | Discharge: 2015-08-25 | Disposition: A | Discharge status: Home / Self Care | Payer: Medicare Other | Source: Ambulatory Visit | Attending: Neurosurgery | Admitting: Neurosurgery

## 2015-08-25 DIAGNOSIS — J309 Allergic rhinitis, unspecified: Secondary | ICD-10-CM

## 2015-08-25 DIAGNOSIS — Z01812 Encounter for preprocedural laboratory examination: Secondary | ICD-10-CM | POA: Diagnosis present

## 2015-08-25 DIAGNOSIS — Z0183 Encounter for blood typing: Secondary | ICD-10-CM | POA: Diagnosis not present

## 2015-08-25 DIAGNOSIS — M5126 Other intervertebral disc displacement, lumbar region: Secondary | ICD-10-CM | POA: Insufficient documentation

## 2015-08-25 LAB — CBC
HCT: 42.2 % (ref 39.0–52.0)
HEMOGLOBIN: 14 g/dL (ref 13.0–17.0)
MCH: 29.9 pg (ref 26.0–34.0)
MCHC: 33.2 g/dL (ref 30.0–36.0)
MCV: 90 fL (ref 78.0–100.0)
Platelets: 271 10*3/uL (ref 150–400)
RBC: 4.69 MIL/uL (ref 4.22–5.81)
RDW: 13.8 % (ref 11.5–15.5)
WBC: 6.6 10*3/uL (ref 4.0–10.5)

## 2015-08-25 LAB — TYPE AND SCREEN
ABO/RH(D): O POS
Antibody Screen: NEGATIVE

## 2015-08-25 LAB — BASIC METABOLIC PANEL
ANION GAP: 7 (ref 5–15)
BUN: 14 mg/dL (ref 6–20)
CHLORIDE: 108 mmol/L (ref 101–111)
CO2: 23 mmol/L (ref 22–32)
Calcium: 10.4 mg/dL — ABNORMAL HIGH (ref 8.9–10.3)
Creatinine, Ser: 1.02 mg/dL (ref 0.61–1.24)
GFR calc non Af Amer: >60 mL/min (ref >60)
Glucose, Bld: 112 mg/dL — ABNORMAL HIGH (ref 65–99)
Potassium: 3.9 mmol/L (ref 3.5–5.1)
Sodium: 138 mmol/L (ref 135–145)

## 2015-08-25 LAB — ABO/RH: ABO/RH(D): O POS

## 2015-08-25 LAB — SURGICAL PCR SCREEN
MRSA, PCR: NEGATIVE
Staphylococcus aureus: NEGATIVE

## 2015-08-25 NOTE — Pre-Procedure Instructions (Signed)
    Rudy JewJames A Lupa  08/25/2015     Your procedure is scheduled on Wednesday, November 9.   [facility]                  Your sugery is scheduled for 3 pm  Call this number if you have problems the morning of [phone]                For any other questions, please call [phone], Monday - Friday 8 AM - 4 PM.                  .  Remember:  Do not eat food or drink liquids after midnight Tuesday, November 8.  Take these medicines the morning of surgery with A SIP OF WATER :amLODipine (NORVASC).                   Take if needed HYDROcodone-acetaminophen (NORCO/VICODIN).                   Stop Taking vitamins, herbal medications, do not take Aspirin, Aspirin Products, Ibuprofen (Advil), Naproxen (Aleve).   Do not wear jewelry, make-up or nail polish.   Do not wear lotions, powders, or perfumes.    Men may shave face and neck.   Do not bring valuables to the hospital.   [facility] for any belongings or valuables.  Contacts, dentures or bridgework may not be worn into surgery.  Leave your suitcase in the car.  After surgery it may be brought to your room.  For patients admitted to the hospital, discharge time will be determined by your treatment team.  Special instructions:  Review  [facility] - Preparing For Surgery.  Please read over the following fact sheets that you were given. Pain Booklet, Coughing and Deep Breathing and Blood Transfusion Information

## 2015-08-29 ENCOUNTER — Ambulatory Visit: Payer: Self-pay | Admitting: Allergy and Immunology

## 2015-08-30 ENCOUNTER — Inpatient Hospital Stay (HOSPITAL_COMMUNITY): Payer: Medicare Other

## 2015-08-30 ENCOUNTER — Inpatient Hospital Stay (HOSPITAL_COMMUNITY): Payer: Medicare Other | Admitting: Certified Registered Nurse Anesthetist

## 2015-08-30 ENCOUNTER — Inpatient Hospital Stay (HOSPITAL_COMMUNITY)
Admission: RE | Admit: 2015-08-30 | Discharge: 2015-09-02 | DRG: 460 | Disposition: A | Discharge status: Home / Self Care | Payer: Medicare Other | Source: Ambulatory Visit | Attending: Neurosurgery | Admitting: Neurosurgery

## 2015-08-30 ENCOUNTER — Encounter (HOSPITAL_COMMUNITY): Payer: Self-pay | Admitting: Certified Registered Nurse Anesthetist

## 2015-08-30 ENCOUNTER — Encounter (HOSPITAL_COMMUNITY)
Admission: RE | Disposition: A | Discharge status: Home / Self Care | Payer: Self-pay | Source: Ambulatory Visit | Attending: Neurosurgery

## 2015-08-30 DIAGNOSIS — Z87891 Personal history of nicotine dependence: Secondary | ICD-10-CM

## 2015-08-30 DIAGNOSIS — I10 Essential (primary) hypertension: Secondary | ICD-10-CM | POA: Diagnosis present

## 2015-08-30 DIAGNOSIS — M5126 Other intervertebral disc displacement, lumbar region: Principal | ICD-10-CM | POA: Diagnosis present

## 2015-08-30 DIAGNOSIS — M79605 Pain in left leg: Secondary | ICD-10-CM | POA: Diagnosis present

## 2015-08-30 DIAGNOSIS — Z419 Encounter for procedure for purposes other than remedying health state, unspecified: Secondary | ICD-10-CM

## 2015-08-30 HISTORY — PX: LUMBAR FUSION: SHX111

## 2015-08-30 LAB — GLUCOSE, CAPILLARY: GLUCOSE-CAPILLARY: 106 mg/dL — AB (ref 65–99)

## 2015-08-30 SURGERY — POSTERIOR LUMBAR FUSION 1 LEVEL
Anesthesia: General

## 2015-08-30 MED ORDER — PHENOL 1.4 % MT LIQD
1.0000 | OROMUCOSAL | Status: DC | PRN
Start: 2015-08-30 — End: 2015-09-02

## 2015-08-30 MED ORDER — DM-GUAIFENESIN ER 30-600 MG PO TB12: 1.0000 | ORAL_TABLET | Freq: Two times a day (BID) | ORAL | Status: DC | PRN

## 2015-08-30 MED ORDER — SODIUM CHLORIDE 0.9 % IJ SOLN
INTRAMUSCULAR | Status: AC
  Filled 2015-08-30: qty 10

## 2015-08-30 MED ORDER — OXYCODONE-ACETAMINOPHEN 5-325 MG PO TABS
1.0000 | ORAL_TABLET | ORAL | Status: DC | PRN
  Administered 2015-08-31 – 2015-09-02 (×9): 2 via ORAL
  Filled 2015-08-30 (×10): qty 2

## 2015-08-30 MED ORDER — CEFAZOLIN SODIUM 1-5 GM-% IV SOLN
1.0000 g | Freq: Three times a day (TID) | INTRAVENOUS | Status: AC
  Administered 2015-08-30 – 2015-08-31 (×2): 1 g via INTRAVENOUS
  Filled 2015-08-30 (×2): qty 50

## 2015-08-30 MED ORDER — LIDOCAINE HCL (CARDIAC) 20 MG/ML IV SOLN
INTRAVENOUS | Status: AC
  Filled 2015-08-30: qty 10

## 2015-08-30 MED ORDER — DEXAMETHASONE SODIUM PHOSPHATE 4 MG/ML IJ SOLN
INTRAMUSCULAR | Status: AC
  Filled 2015-08-30: qty 2

## 2015-08-30 MED ORDER — 0.9 % SODIUM CHLORIDE (POUR BTL) OPTIME
TOPICAL | Status: DC | PRN
  Administered 2015-08-30 (×2): 1000 mL

## 2015-08-30 MED ORDER — HYDROMORPHONE HCL 1 MG/ML IJ SOLN
INTRAMUSCULAR | Status: AC
  Administered 2015-08-30: 0.5 mg via INTRAVENOUS
  Filled 2015-08-30: qty 1

## 2015-08-30 MED ORDER — BISACODYL 5 MG PO TBEC
5.0000 mg | DELAYED_RELEASE_TABLET | Freq: Every day | ORAL | Status: DC | PRN
  Administered 2015-09-01: 5 mg via ORAL
  Filled 2015-08-30: qty 1

## 2015-08-30 MED ORDER — AMLODIPINE BESYLATE 5 MG PO TABS
5.0000 mg | ORAL_TABLET | Freq: Every day | ORAL | Status: DC
  Administered 2015-08-31 – 2015-09-02 (×3): 5 mg via ORAL
  Filled 2015-08-30 (×3): qty 1

## 2015-08-30 MED ORDER — ROCURONIUM BROMIDE 50 MG/5ML IV SOLN
INTRAVENOUS | Status: AC
  Filled 2015-08-30: qty 1

## 2015-08-30 MED ORDER — GLYCOPYRROLATE 0.2 MG/ML IJ SOLN
INTRAMUSCULAR | Status: AC
  Filled 2015-08-30: qty 4

## 2015-08-30 MED ORDER — SODIUM CHLORIDE 0.9 % IV SOLN: 250.0000 mL | INTRAVENOUS | Status: DC

## 2015-08-30 MED ORDER — FENTANYL CITRATE (PF) 250 MCG/5ML IJ SOLN
INTRAMUSCULAR | Status: AC
  Filled 2015-08-30: qty 5

## 2015-08-30 MED ORDER — ROSUVASTATIN CALCIUM 5 MG PO TABS
5.0000 mg | ORAL_TABLET | Freq: Every day | ORAL | Status: DC
  Administered 2015-08-30 – 2015-09-01 (×3): 5 mg via ORAL
  Filled 2015-08-30 (×4): qty 1

## 2015-08-30 MED ORDER — ACETAMINOPHEN 650 MG RE SUPP: 650.0000 mg | RECTAL | Status: DC | PRN

## 2015-08-30 MED ORDER — FLUTICASONE PROPIONATE 50 MCG/ACT NA SUSP
2.0000 | Freq: Every day | NASAL | Status: DC
  Administered 2015-08-31 – 2015-09-01 (×2): 2 via NASAL
  Filled 2015-08-30: qty 16

## 2015-08-30 MED ORDER — PHENYLEPHRINE 40 MCG/ML (10ML) SYRINGE FOR IV PUSH (FOR BLOOD PRESSURE SUPPORT)
PREFILLED_SYRINGE | INTRAVENOUS | Status: AC
  Filled 2015-08-30: qty 10

## 2015-08-30 MED ORDER — PROPOFOL 10 MG/ML IV BOLUS
INTRAVENOUS | Status: AC
  Filled 2015-08-30: qty 20

## 2015-08-30 MED ORDER — VITAMIN D3 25 MCG (1000 UNIT) PO TABS
2000.0000 [IU] | ORAL_TABLET | Freq: Every day | ORAL | Status: DC
  Administered 2015-08-31 – 2015-09-02 (×3): 2000 [IU] via ORAL
  Filled 2015-08-30 (×6): qty 2

## 2015-08-30 MED ORDER — HYDROMORPHONE HCL 1 MG/ML IJ SOLN
0.2500 mg | INTRAMUSCULAR | Status: DC | PRN
  Administered 2015-08-30 (×2): 0.5 mg via INTRAVENOUS

## 2015-08-30 MED ORDER — CEFAZOLIN SODIUM-DEXTROSE 2-3 GM-% IV SOLR
2.0000 g | INTRAVENOUS | Status: AC
  Administered 2015-08-30: 2 g via INTRAVENOUS

## 2015-08-30 MED ORDER — DOCUSATE SODIUM 100 MG PO CAPS
200.0000 mg | ORAL_CAPSULE | Freq: Every evening | ORAL | Status: DC | PRN
  Administered 2015-08-31: 200 mg via ORAL
  Filled 2015-08-30: qty 2

## 2015-08-30 MED ORDER — PROPOFOL 10 MG/ML IV BOLUS
INTRAVENOUS | Status: DC | PRN
  Administered 2015-08-30: 200 mg via INTRAVENOUS

## 2015-08-30 MED ORDER — BUPIVACAINE HCL (PF) 0.5 % IJ SOLN
INTRAMUSCULAR | Status: DC | PRN
  Administered 2015-08-30: 30 mL

## 2015-08-30 MED ORDER — NEOSTIGMINE METHYLSULFATE 10 MG/10ML IV SOLN
INTRAVENOUS | Status: AC
Start: 2015-08-30 — End: 2015-08-30
  Filled 2015-08-30: qty 1

## 2015-08-30 MED ORDER — MIDAZOLAM HCL 5 MG/5ML IJ SOLN
INTRAMUSCULAR | Status: DC | PRN
  Administered 2015-08-30: 2 mg via INTRAVENOUS

## 2015-08-30 MED ORDER — ROCURONIUM BROMIDE 50 MG/5ML IV SOLN
INTRAVENOUS | Status: AC
  Filled 2015-08-30: qty 2

## 2015-08-30 MED ORDER — SUCCINYLCHOLINE CHLORIDE 20 MG/ML IJ SOLN
INTRAMUSCULAR | Status: AC
  Filled 2015-08-30: qty 2

## 2015-08-30 MED ORDER — GLYCOPYRROLATE 0.2 MG/ML IJ SOLN
INTRAMUSCULAR | Status: DC | PRN
  Administered 2015-08-30: .8 mg via INTRAVENOUS

## 2015-08-30 MED ORDER — FENTANYL CITRATE (PF) 100 MCG/2ML IJ SOLN
INTRAMUSCULAR | Status: DC | PRN
  Administered 2015-08-30: 100 ug via INTRAVENOUS
  Administered 2015-08-30 (×2): 25 ug via INTRAVENOUS
  Administered 2015-08-30 (×2): 50 ug via INTRAVENOUS
  Administered 2015-08-30: 100 ug via INTRAVENOUS

## 2015-08-30 MED ORDER — CHROMIUM 500 MCG PO TABS: 1.0000 | ORAL_TABLET | Freq: Every morning | ORAL | Status: DC

## 2015-08-30 MED ORDER — LACTATED RINGERS IV SOLN
INTRAVENOUS | Status: DC
  Administered 2015-08-30 (×3): via INTRAVENOUS
  Administered 2015-08-30: 50 mL/h via INTRAVENOUS

## 2015-08-30 MED ORDER — ETOMIDATE 2 MG/ML IV SOLN
INTRAVENOUS | Status: AC
  Filled 2015-08-30: qty 10

## 2015-08-30 MED ORDER — MENTHOL 3 MG MT LOZG: 1.0000 | LOZENGE | OROMUCOSAL | Status: DC | PRN

## 2015-08-30 MED ORDER — HYDROMORPHONE HCL 1 MG/ML IJ SOLN
0.5000 mg | INTRAMUSCULAR | Status: DC | PRN
  Administered 2015-08-31 (×2): 1 mg via INTRAVENOUS
  Filled 2015-08-30 (×2): qty 1

## 2015-08-30 MED ORDER — POTASSIUM CHLORIDE IN NACL 20-0.9 MEQ/L-% IV SOLN
INTRAVENOUS | Status: DC
  Administered 2015-08-30 – 2015-08-31 (×2): via INTRAVENOUS
  Filled 2015-08-30 (×6): qty 1000

## 2015-08-30 MED ORDER — PHENYLEPHRINE 40 MCG/ML (10ML) SYRINGE FOR IV PUSH (FOR BLOOD PRESSURE SUPPORT)
PREFILLED_SYRINGE | INTRAVENOUS | Status: AC
  Filled 2015-08-30: qty 30

## 2015-08-30 MED ORDER — LIDOCAINE HCL (CARDIAC) 20 MG/ML IV SOLN
INTRAVENOUS | Status: AC
  Filled 2015-08-30: qty 5

## 2015-08-30 MED ORDER — MAGNESIUM CITRATE PO SOLN: 1.0000 | Freq: Once | ORAL | Status: DC | PRN

## 2015-08-30 MED ORDER — SURGIFOAM 100 EX MISC
CUTANEOUS | Status: DC | PRN
  Administered 2015-08-30: 17:00:00 via TOPICAL

## 2015-08-30 MED ORDER — ONDANSETRON HCL 4 MG/2ML IJ SOLN: 4.0000 mg | INTRAMUSCULAR | Status: DC | PRN

## 2015-08-30 MED ORDER — ONDANSETRON HCL 4 MG/2ML IJ SOLN
INTRAMUSCULAR | Status: DC | PRN
  Administered 2015-08-30: 4 mg via INTRAVENOUS

## 2015-08-30 MED ORDER — ACETAMINOPHEN 325 MG PO TABS: 650.0000 mg | ORAL_TABLET | ORAL | Status: DC | PRN

## 2015-08-30 MED ORDER — ADULT MULTIVITAMIN W/MINERALS CH
1.0000 | ORAL_TABLET | Freq: Every day | ORAL | Status: DC
  Administered 2015-08-30 – 2015-09-02 (×4): 1 via ORAL
  Filled 2015-08-30 (×4): qty 1

## 2015-08-30 MED ORDER — NEOSTIGMINE METHYLSULFATE 10 MG/10ML IV SOLN
INTRAVENOUS | Status: DC | PRN
  Administered 2015-08-30: 5 mg via INTRAVENOUS

## 2015-08-30 MED ORDER — METHOCARBAMOL 500 MG PO TABS
500.0000 mg | ORAL_TABLET | Freq: Four times a day (QID) | ORAL | Status: DC | PRN
  Administered 2015-08-31 – 2015-09-02 (×7): 500 mg via ORAL
  Filled 2015-08-30 (×7): qty 1

## 2015-08-30 MED ORDER — SODIUM CHLORIDE 0.9 % IJ SOLN: 3.0000 mL | INTRAMUSCULAR | Status: DC | PRN

## 2015-08-30 MED ORDER — PROMETHAZINE HCL 25 MG/ML IJ SOLN: 6.2500 mg | INTRAMUSCULAR | Status: DC | PRN

## 2015-08-30 MED ORDER — CEFAZOLIN SODIUM-DEXTROSE 2-3 GM-% IV SOLR
INTRAVENOUS | Status: AC
  Filled 2015-08-30: qty 50

## 2015-08-30 MED ORDER — ROCURONIUM BROMIDE 100 MG/10ML IV SOLN
INTRAVENOUS | Status: DC | PRN
  Administered 2015-08-30 (×2): 20 mg via INTRAVENOUS
  Administered 2015-08-30: 10 mg via INTRAVENOUS
  Administered 2015-08-30: 50 mg via INTRAVENOUS

## 2015-08-30 MED ORDER — SENNOSIDES-DOCUSATE SODIUM 8.6-50 MG PO TABS: 1.0000 | ORAL_TABLET | Freq: Every evening | ORAL | Status: DC | PRN

## 2015-08-30 MED ORDER — SUCCINYLCHOLINE CHLORIDE 20 MG/ML IJ SOLN
INTRAMUSCULAR | Status: DC | PRN
  Administered 2015-08-30: 140 mg via INTRAVENOUS

## 2015-08-30 MED ORDER — ZOLPIDEM TARTRATE 5 MG PO TABS: 5.0000 mg | ORAL_TABLET | Freq: Every evening | ORAL | Status: DC | PRN

## 2015-08-30 MED ORDER — LIDOCAINE HCL (CARDIAC) 20 MG/ML IV SOLN
INTRAVENOUS | Status: DC | PRN
  Administered 2015-08-30 (×2): 40 mg via INTRAVENOUS

## 2015-08-30 MED ORDER — ONDANSETRON HCL 4 MG/2ML IJ SOLN
INTRAMUSCULAR | Status: AC
  Filled 2015-08-30: qty 2

## 2015-08-30 MED ORDER — PHENYLEPHRINE HCL 10 MG/ML IJ SOLN
INTRAMUSCULAR | Status: AC
  Filled 2015-08-30: qty 2

## 2015-08-30 MED ORDER — SODIUM CHLORIDE 0.9 % IJ SOLN
3.0000 mL | Freq: Two times a day (BID) | INTRAMUSCULAR | Status: DC
  Administered 2015-08-30 – 2015-09-01 (×5): 3 mL via INTRAVENOUS

## 2015-08-30 MED ORDER — MIDAZOLAM HCL 2 MG/2ML IJ SOLN
INTRAMUSCULAR | Status: AC
  Filled 2015-08-30: qty 4

## 2015-08-30 MED ORDER — STERILE WATER FOR INJECTION IJ SOLN
INTRAMUSCULAR | Status: AC
  Filled 2015-08-30: qty 10

## 2015-08-30 MED ORDER — ONDANSETRON HCL 4 MG/2ML IJ SOLN
INTRAMUSCULAR | Status: AC
  Filled 2015-08-30: qty 4

## 2015-08-30 MED ORDER — HYDROCODONE-ACETAMINOPHEN 5-325 MG PO TABS: 1.0000 | ORAL_TABLET | ORAL | Status: DC | PRN

## 2015-08-30 MED ORDER — TRIAMTERENE-HCTZ 37.5-25 MG PO TABS
0.5000 | ORAL_TABLET | Freq: Every day | ORAL | Status: DC
  Administered 2015-08-30 – 2015-09-02 (×4): 0.5 via ORAL
  Filled 2015-08-30 (×4): qty 1

## 2015-08-30 MED ORDER — POTASSIUM CHLORIDE CRYS ER 10 MEQ PO TBCR
5.0000 meq | EXTENDED_RELEASE_TABLET | Freq: Every day | ORAL | Status: DC
  Administered 2015-08-31 – 2015-09-01 (×2): 5 meq via ORAL
  Filled 2015-08-30: qty 1

## 2015-08-30 MED ORDER — MIDAZOLAM HCL 2 MG/2ML IJ SOLN
INTRAMUSCULAR | Status: AC
  Filled 2015-08-30: qty 2

## 2015-08-30 MED ORDER — EPHEDRINE SULFATE 50 MG/ML IJ SOLN
INTRAMUSCULAR | Status: DC | PRN
  Administered 2015-08-30 (×4): 10 mg via INTRAVENOUS

## 2015-08-30 SURGICAL SUPPLY — 64 items
APL SKNCLS STERI-STRIP NONHPOA (GAUZE/BANDAGES/DRESSINGS)
BAG DECANTER FOR FLEXI CONT (MISCELLANEOUS) ×2 IMPLANT
BENZOIN TINCTURE PRP APPL 2/3 (GAUZE/BANDAGES/DRESSINGS) IMPLANT
BLADE CLIPPER SURG (BLADE) IMPLANT
BONE MATRIX VIVIGEN 5CC (Bone Implant) ×3 IMPLANT
BUR MATCHSTICK NEURO 3.0 LAGG (BURR) ×3 IMPLANT
BUR PRECISION FLUTE 5.0 (BURR) ×3 IMPLANT
CANISTER SUCT 3000ML PPV (MISCELLANEOUS) ×3 IMPLANT
CLOSURE WOUND 1/2 X4 (GAUZE/BANDAGES/DRESSINGS)
CONT SPEC 4OZ CLIKSEAL STRL BL (MISCELLANEOUS) ×3 IMPLANT
COVER BACK TABLE 60X90IN (DRAPES) ×3 IMPLANT
DECANTER SPIKE VIAL GLASS SM (MISCELLANEOUS) ×3 IMPLANT
DRAPE C-ARM 42X72 X-RAY (DRAPES) ×8 IMPLANT
DRAPE C-ARMOR (DRAPES) IMPLANT
DRAPE LAPAROTOMY 100X72X124 (DRAPES) ×3 IMPLANT
DRAPE POUCH INSTRU U-SHP 10X18 (DRAPES) ×3 IMPLANT
DRAPE SURG 17X23 STRL (DRAPES) ×3 IMPLANT
DRSG OPSITE POSTOP 4X8 (GAUZE/BANDAGES/DRESSINGS) ×2 IMPLANT
DURAPREP 26ML APPLICATOR (WOUND CARE) ×3 IMPLANT
ELECT REM PT RETURN 9FT ADLT (ELECTROSURGICAL) ×3
ELECTRODE REM PT RTRN 9FT ADLT (ELECTROSURGICAL) ×1 IMPLANT
GAUZE SPONGE 4X4 12PLY STRL (GAUZE/BANDAGES/DRESSINGS) IMPLANT
GAUZE SPONGE 4X4 16PLY XRAY LF (GAUZE/BANDAGES/DRESSINGS) IMPLANT
GLOVE ECLIPSE 6.5 STRL STRAW (GLOVE) ×6 IMPLANT
GLOVE EXAM NITRILE LRG STRL (GLOVE) IMPLANT
GLOVE EXAM NITRILE MD LF STRL (GLOVE) IMPLANT
GLOVE EXAM NITRILE XL STR (GLOVE) IMPLANT
GLOVE EXAM NITRILE XS STR PU (GLOVE) IMPLANT
GOWN STRL REUS W/ TWL LRG LVL3 (GOWN DISPOSABLE) ×2 IMPLANT
GOWN STRL REUS W/ TWL XL LVL3 (GOWN DISPOSABLE) IMPLANT
GOWN STRL REUS W/TWL 2XL LVL3 (GOWN DISPOSABLE) IMPLANT
GOWN STRL REUS W/TWL LRG LVL3 (GOWN DISPOSABLE) ×6
GOWN STRL REUS W/TWL XL LVL3 (GOWN DISPOSABLE)
GRAFT BNE MATRIX VG 5 (Bone Implant) IMPLANT
KIT BASIN OR (CUSTOM PROCEDURE TRAY) ×3 IMPLANT
KIT INFUSE XX SMALL 0.7CC (Orthopedic Implant) ×2 IMPLANT
KIT POSITION SURG JACKSON T1 (MISCELLANEOUS) ×3 IMPLANT
KIT ROOM TURNOVER OR (KITS) ×3 IMPLANT
LIQUID BAND (GAUZE/BANDAGES/DRESSINGS) ×3 IMPLANT
NDL HYPO 25X1 1.5 SAFETY (NEEDLE) ×1 IMPLANT
NDL SPNL 18GX3.5 QUINCKE PK (NEEDLE) IMPLANT
NEEDLE HYPO 25X1 1.5 SAFETY (NEEDLE) ×3 IMPLANT
NEEDLE SPNL 18GX3.5 QUINCKE PK (NEEDLE) IMPLANT
NS IRRIG 1000ML POUR BTL (IV SOLUTION) ×5 IMPLANT
PACK LAMINECTOMY NEURO (CUSTOM PROCEDURE TRAY) ×3 IMPLANT
PAD ARMBOARD 7.5X6 YLW CONV (MISCELLANEOUS) ×6 IMPLANT
ROD DANEK 30MM (Rod) ×4 IMPLANT
SCREW PEDICLE VA L635 6.5X45M (Screw) ×4 IMPLANT
SCREW SET BREAK OFF (Screw) ×8 IMPLANT
SPACER OPAL REVOLVE 10MM ×2 IMPLANT
SPONGE LAP 4X18 X RAY DECT (DISPOSABLE) IMPLANT
SPONGE SURGIFOAM ABS GEL 100 (HEMOSTASIS) ×3 IMPLANT
STRIP CLOSURE SKIN 1/2X4 (GAUZE/BANDAGES/DRESSINGS) IMPLANT
SUT PROLENE 6 0 BV (SUTURE) IMPLANT
SUT VIC AB 0 CT1 18XCR BRD8 (SUTURE) ×1 IMPLANT
SUT VIC AB 0 CT1 8-18 (SUTURE) ×6
SUT VIC AB 2-0 CT1 18 (SUTURE) ×3 IMPLANT
SUT VIC AB 3-0 SH 8-18 (SUTURE) ×7 IMPLANT
SYR CONTROL 10ML LL (SYRINGE) ×4 IMPLANT
TOWEL OR 17X24 6PK STRL BLUE (TOWEL DISPOSABLE) ×3 IMPLANT
TOWEL OR 17X26 10 PK STRL BLUE (TOWEL DISPOSABLE) ×3 IMPLANT
TRAY FOLEY CATH 16FRSI W/METER (SET/KITS/TRAYS/PACK) ×2 IMPLANT
TRAY FOLEY W/METER SILVER 14FR (SET/KITS/TRAYS/PACK) ×1 IMPLANT
WATER STERILE IRR 1000ML POUR (IV SOLUTION) ×3 IMPLANT

## 2015-08-30 NOTE — Anesthesia Postprocedure Evaluation (Signed)
Anesthesia Post Note  Patient: Tyler [name]  Procedure(s) Performed: Procedure(s) (LRB): Lumbar four- Lumbar five posterior lumbar interbody fusion with interbody prosthesis posterior lateral arthrodesis and posterior segmental instrumentation (N/A)  Anesthesia type: General  Patient location: PACU  Post pain: Pain level controlled  Post assessment: Post-op Vital signs reviewed  Last Vitals: BP 136/70 mmHg  Pulse 69  Temp(Src) 36.2 C (Oral)  Resp 18  Wt 234 lb (106.142 kg)  SpO2 100%  Post vital signs: Reviewed  Level of consciousness: sedated  Complications: No apparent anesthesia complications

## 2015-08-30 NOTE — Transfer of Care (Signed)
Immediate Anesthesia Transfer of Care Note  Patient: Tyler [name]  Procedure(s) Performed: Procedure(s) with comments: Lumbar four- Lumbar five posterior lumbar interbody fusion with interbody prosthesis posterior lateral arthrodesis and posterior segmental instrumentation (N/A) - L45 posterior lumbar interbody fusion with interbody prosthesis posterior lateral arthrodesis and posterior segmental instrumentation  Patient Location: PACU  Anesthesia Type:General  Level of Consciousness: awake, alert  and patient cooperative  Airway & Oxygen Therapy: Patient Spontanous Breathing and Patient connected to face mask oxygen  Post-op Assessment: Report given to RN and Post -op Vital signs reviewed and stable  Post vital signs: Reviewed and stable  Last Vitals:  Filed Vitals:   08/30/15 1317  BP: 153/72  Pulse: 54  Temp: 36.4 C  Resp: 20    Complications: No apparent anesthesia complications

## 2015-08-30 NOTE — Anesthesia Preprocedure Evaluation (Addendum)
Anesthesia Evaluation  Patient identified by MRN, date of birth, ID band Patient awake    Reviewed: Allergy & Precautions, NPO status , Patient's Chart, lab work & pertinent test results  Airway Mallampati: II  TM Distance: >3 FB Neck ROM: Full    Dental   Pulmonary former smoker,    breath sounds clear to auscultation       Cardiovascular hypertension,  Rhythm:Regular Rate:Normal     Neuro/Psych    GI/Hepatic negative GI ROS, Neg liver ROS,   Endo/Other  negative endocrine ROS  Renal/GU negative Renal ROS     Musculoskeletal   Abdominal   Peds  Hematology   Anesthesia Other Findings   Reproductive/Obstetrics                            Anesthesia Physical Anesthesia Plan  ASA: III  Anesthesia Plan: General   Post-op Pain Management:    Induction: Intravenous  Airway Management Planned: Oral ETT  Additional Equipment:   Intra-op Plan:   Post-operative Plan: Extubation in OR  Informed Consent: I have reviewed the patients History and Physical, chart, labs and discussed the procedure including the risks, benefits and alternatives for the proposed anesthesia with the patient or authorized representative who has indicated his/her understanding and acceptance.   Dental advisory given  Plan Discussed with: CRNA and Anesthesiologist  Anesthesia Plan Comments:         Anesthesia Quick Evaluation  

## 2015-08-30 NOTE — Op Note (Addendum)
08/30/2015  7:19 PM  PATIENT:  Tyler FearingJames A Tilson  69 y.o. male with a recurrent L4/5 disc herniation on the left above a fused level. Given the recurrence and the clear instability at this level, i believe a fusion and discetomy by way of a Plif is indicated.   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 male whom was taken to the operating room intubated, and placed under a general anesthetic without difficulty. A foley catheter was placed under sterile conditions. He was positioned prone on a Jackson stable with all pressure points properly padded.  His lumbar region was prepped and draped in a sterile manner. I infiltrated 30cc's 1/2%lidocaine/1:2000,000 strength epinephrine into the planned incision. I opened the skin with a 10 blade and took the incision down to the thoracolumbar fascia. I exposed the lamina of L3, and L4 in a subperiosteal fashion bilaterally. I confirmed my location with an intraoperative xray.  I placed self retaining retractors and started the decompression.   PLIF's were performed at L4/5 on the left side. I opened the disc space with a 15 blade then used a variety of instruments to remove the disc and prepare the space for the arthrodesis. I used curettes, rongeurs, punches, shavers for the disc space, and rasps in the discetomy. I measured the disc space and placed a 10mm  Peek  cages(Synthes) into the disc space(s).  I filled the cage with local allo and autograft, along with a very very small BMP packed into the disc space with more allograft.  We placed pedicle screws at L4, using fluoroscopic guidance. I drilled a pilot hole, then cannulated the pedicle with a bone probe at each site. We then tapped each pedicle, assessing each site for pedicle violations. No cutouts were appreciated. Screws (Medtronic) were then placed at each site without difficulty. Final films were performed and all screws appeared to be in good position.  I closed the wound in a layered fashion. I approximated the thoracolumbar fascia, subcutaneous, and subcuticular planes with vicryl sutures. I used dermabond, and an occlusive bandage for a sterile dressing.     PLAN OF CARE: Admit to inpatient   PATIENT DISPOSITION:  PACU - hemodynamically stable.   Delay start of Pharmacological VTE agent (>24hrs) due to surgical blood loss or risk of bleeding:  yes

## 2015-08-30 NOTE — Anesthesia Procedure Notes (Signed)
Procedure Name: Intubation Date/Time: 08/30/2015 3:34 PM Performed by: Orvilla FusATO, Liliani Bobo A Pre-anesthesia Checklist: Timeout performed, Patient identified, Emergency Drugs available, Suction available and Patient being monitored Patient Re-evaluated:Patient Re-evaluated prior to inductionOxygen Delivery Method: Circle system utilized Preoxygenation: Pre-oxygenation with 100% oxygen Intubation Type: IV induction Ventilation: Mask ventilation without difficulty and Oral airway inserted - appropriate to patient size Laryngoscope Size: Mac and 4 Grade View: Grade I Tube type: Oral Tube size: 7.5 mm Number of attempts: 1 Airway Equipment and Method: Stylet

## 2015-08-30 NOTE — H&P (Signed)
BP 153/72 mmHg  Pulse 54  Temp(Src) 97.6 F (36.4 C) (Oral)  Resp 20  Wt 106.142 kg (234 lb)  SpO2 99% ZO:[id number]:Recurrent hnp L4/5, left lower extremity radiculopathy Tyler [name] was taken to the operating room for an uncomplicated lumbar discetomy in July of this year. He did well initially but soon had pain again in the left lower extremity. An MRI revealed a recurrent disc herniation at L4/5. I will take him to the operating room for a redo discetomy and extension of his L5/S1 fusion. No Known Allergies Prior to Admission medications   Medication Sig Start Date End Date Taking? Authorizing Provider  acetaminophen (TYLENOL) 500 MG tablet Take 500 mg by mouth every 6 (six) hours as needed for mild pain.   Yes Historical Provider, MD  amLODipine (NORVASC) 5 MG tablet Take 5 mg by mouth daily.   Yes Historical Provider, MD  Cholecalciferol (VITAMIN D3) 2000 UNITS TABS Take 2,000 Units by mouth daily.   Yes Historical Provider, MD  CHROMIUM PO Take 1 tablet by mouth every morning.   Yes Historical Provider, MD  CRESTOR 5 MG tablet Take 5 mg by mouth at bedtime. 07/25/15  Yes Historical Provider, MD  dextromethorphan-guaiFENesin (MUCINEX DM) 30-600 MG 12hr tablet Take 1 tablet by mouth 2 (two) times daily as needed for cough.   Yes Historical Provider, MD  docusate sodium (COLACE) 100 MG capsule Take 200 mg by mouth at bedtime as needed for mild constipation.   Yes Historical Provider, MD  HYDROcodone-acetaminophen (NORCO/VICODIN) 5-325 MG tablet Take 1 tablet by mouth every 6 (six) hours as needed for moderate pain.  07/12/15  Yes Historical Provider, MD  methocarbamol (ROBAXIN) 500 MG tablet Take 1 tablet (500 mg total) by mouth 4 (four) times daily. Patient taking differently: Take 500 mg by mouth 2 (two) times daily as needed for muscle spasms.  05/20/15  Yes Maeola HarmanJoseph Stern, MD  multivitamin-iron-minerals-folic acid (CENTRUM) chewable tablet Chew 1 tablet by mouth daily.   Yes Historical  Provider, MD  Potassium 99 MG TABS Take 99 mg by mouth daily.   Yes Historical Provider, MD  QNASL 80 MCG/ACT AERS Place 1 spray into both nostrils daily. 08/10/15  Yes Historical Provider, MD  triamterene-hydrochlorothiazide (MAXZIDE-25) 37.5-25 MG per tablet Take 0.5 tablets by mouth daily.    Yes Historical Provider, MD   Past Medical History  Diagnosis Date  . Hypertension   . Obesity   . Hyperlipidemia   . Heartburn   . Insulin resistance    Past Surgical History  Procedure Laterality Date  . Back surgery      03/14/2004  . Eye surgery      bil cataract  02/2013  . Lumbar laminectomy/decompression microdiscectomy N/A 05/19/2015    Procedure: Lumbar four- five diskectomy;  Surgeon: Coletta MemosKyle Marlis Oldaker, MD;  Location: [facility];  Service: Neurosurgery;  Laterality: N/A;  L45 diskectomy   No family history on file. Social History   Social History  . Marital Status: Married    Spouse Name: N/A  . Number of Children: N/A  . Years of Education: N/A   Occupational History  . Not on file.   Social History Main Topics  . Smoking status: Former Games developermoker  . Smokeless tobacco: Not on file     Comment: quit smokling in '70s  . Alcohol Use: 2.4 oz/week    4 Shots of liquor per week     Comment: social   . Drug Use: No  . Sexual Activity:  Not on file   Other Topics Concern  . Not on file   Social History Narrative   A/P Or for discetomy and fusion.Risks and benefits were explained including but not limited to bleeding, infection, nerve damage, muscle weakness, bowel and or bladder dysfunction, no improvement were explained. He understands and wishes to proceed.

## 2015-08-31 NOTE — Progress Notes (Signed)
Patient ID: Tyler [name], male   DOB: 05/26/1946, 69 y.o.   MRN: [id number] BP 156/60 mmHg  Pulse 63  Temp(Src) 98.8 F (37.1 C) (Oral)  Resp 20  Ht 5\' 8"  (1.727 m)  Wt 106.142 kg (234 lb)  BMI 35.59 kg/m2  SpO2 96% Alert and oriented x 4, speech is clear and fluent Moving all extremities well Wound is clean,dry Doing well, working with pt

## 2015-08-31 NOTE — Progress Notes (Signed)
Utilization review completed.  

## 2015-08-31 NOTE — Progress Notes (Signed)
Occupational Therapy Evaluation Patient Details Name: Tyler [name] MRN: [id number] DOB: November 05, 1945 Today's Date: 08/31/2015    History of Present Illness s/p L4-5 fusion  PMHx- 04/2015 L4-5 discectomy; HTN; obesity   Clinical Impression   Pt admitted with the above diagnoses and presents with below problem list. Pt will benefit from continued acute OT to address the below listed deficits and maximize independence with BADLs prior to d/c home with spouse assisting. PTA pt was mod I with ADLs. Pt is currently min guard with most ADLs; min A for tub shower transfer and pericare. ADL education provided. OT to continue to follow acutely.      Follow Up Recommendations  No OT follow up;Supervision - Intermittent    Equipment Recommendations  3 in 1 bedside comode    Recommendations for Other Services       Precautions / Restrictions Precautions Precautions: Back Precaution Comments: able to state 2/3 precautions Required Braces or Orthoses: Spinal Brace Spinal Brace: Lumbar corset;Applied in sitting position Restrictions Weight Bearing Restrictions: No      Mobility Bed Mobility Overal bed mobility: Needs Assistance Bed Mobility: Rolling;Sidelying to Sit;Sit to Sidelying Rolling: Min guard Sidelying to sit: Min guard     Sit to sidelying: Min guard General bed mobility comments: Pt used rails to roll to side. Close min guard but no physical assist.   Transfers Overall transfer level: Needs assistance Equipment used: Rolling walker (2 wheeled) Transfers: Sit to/from Stand Sit to Stand: Min guard         General transfer comment: from EOB and comfort height toilet    Balance Overall balance assessment: Needs assistance Sitting-balance support: No upper extremity supported Sitting balance-Leahy Scale: Good     Standing balance support: Bilateral upper extremity supported;During functional activity Standing balance-Leahy Scale: Fair                               ADL Overall ADL's : Needs assistance/impaired Eating/Feeding: Set up;Sitting   Grooming: Min guard;Standing   Upper Body Bathing: Sitting;Set up   Lower Body Bathing: Min guard;Sit to/from stand   Upper Body Dressing : Sitting;Set up   Lower Body Dressing: Min guard;Sit to/from stand   Toilet Transfer: Min guard;Ambulation;Comfort height toilet;Grab bars;RW   Toileting- Clothing Manipulation and Hygiene: Minimal assistance;Sit to/from stand   Tub/ Shower Transfer: Minimal assistance;Tub transfer;Ambulation;3 in 1;Rolling walker   Functional mobility during ADLs: Min guard;Rolling walker General ADL Comments: Pt compelted bed mobilty and toilet transfer as detailed above. Educated pt and spouse on ADL techniques inclusing LB dressing, tongs for pericare, tub transfer technique and bed mobility. Discussed obtaining a bedside grab bar as spouse has been assisting with rolling to side at home.     Vision     Perception     Praxis      Pertinent Vitals/Pain Pain Assessment: 0-10 Pain Score: 3  Pain Location: back Pain Descriptors / Indicators: Sore Pain Intervention(s): Limited activity within patient's tolerance;Monitored during session;Premedicated before session;Repositioned     Hand Dominance     Extremity/Trunk Assessment Upper Extremity Assessment Upper Extremity Assessment: Overall WFL for tasks assessed   Lower Extremity Assessment Lower Extremity Assessment: Defer to PT evaluation       Communication Communication Communication: No difficulties   Cognition Arousal/Alertness: Awake/alert Behavior During Therapy: WFL for tasks assessed/performed Overall Cognitive Status: Within Functional Limits for tasks assessed  General Comments       Exercises       Shoulder Instructions      Home Living Family/patient expects to be discharged to:: Private residence Living Arrangements: Spouse/significant  other Available Help at Discharge: Family;Available 24 hours/day Type of Home: House Home Access: Stairs to enter Entergy CorporationEntrance Stairs-Number of Steps: 2 Entrance Stairs-Rails: None Home Layout: Two level Alternate Level Stairs-Number of Steps: 6+6 Alternate Level Stairs-Rails: Right Bathroom Shower/Tub: Tub/shower unit;Curtain Shower/tub characteristics: Engineer, building servicesCurtain Bathroom Toilet: Standard Bathroom Accessibility: Yes   Home Equipment: Environmental consultantWalker - 2 wheels          Prior Functioning/Environment Level of Independence: Independent with assistive device(s)        Comments: time to time still using RW, otherwise independent    OT Diagnosis: Acute pain   OT Problem List: Impaired balance (sitting and/or standing);Decreased knowledge of use of DME or AE;Decreased knowledge of precautions;Pain   OT Treatment/Interventions: Self-care/ADL training;DME and/or AE instruction;Therapeutic activities;Balance training;Patient/family education    OT Goals(Current goals can be found in the care plan section) Acute Rehab OT Goals Patient Stated Goal: stay in upstairs bedroom with firm mattress OT Goal Formulation: With patient/family Time For Goal Achievement: 09/07/15 Potential to Achieve Goals: Good ADL Goals Pt Will Perform Grooming: with modified independence;standing Pt Will Perform Lower Body Bathing: with modified independence;sit to/from stand Pt Will Perform Lower Body Dressing: with modified independence;sit to/from stand Pt Will Transfer to Toilet: with modified independence;ambulating;bedside commode Pt Will Perform Tub/Shower Transfer: Tub transfer;with supervision;ambulating;3 in 1;rolling walker Additional ADL Goal #1: Pt will complete bed mobility at mod I level to prepare for OOB ADLs.   OT Frequency: Min 2X/week   Barriers to D/C:            Co-evaluation              End of Session Equipment Utilized During Treatment: Rolling walker;Back brace  Activity Tolerance:  Patient tolerated treatment well Patient left: in bed;with call bell/phone within reach;with family/visitor present   Time: 1358-1420 OT Time Calculation (min): 22 min Charges:  OT General Charges $OT Visit: 1 Procedure OT Evaluation $Initial OT Evaluation Tier I: 1 Procedure G-Codes:    Pilar GrammesMathews, Corinne Goucher H 08/31/2015, 2:34 PM

## 2015-08-31 NOTE — Progress Notes (Signed)
Pt admitted from PACU, alert and oriented accompanied by wife, pt settled in bed with call light within reach, will however continue to monitor, v/s stable. Obasogie-Asidi, Grey Rakestraw Efe

## 2015-08-31 NOTE — Evaluation (Signed)
Physical Therapy Evaluation Patient Details Name: Tyler [name] MRN: [id number] DOB: Mar 17, 1946 Today's Date: 08/31/2015   History of Present Illness  s/p L4-5 fusion  PMHx- 04/2015 L4-5 discectomy; HTN; obesity  Clinical Impression  Patient is s/p above surgery resulting in the deficits listed below (see PT Problem List).  Patient will benefit from skilled PT to increase their independence and safety with mobility (while adhering to their precautions) to allow discharge to the venue listed below.     Follow Up Recommendations No PT follow up    Equipment Recommendations  None recommended by PT    Recommendations for Other Services OT consult     Precautions / Restrictions Precautions Precautions: Back Precaution Booklet Issued: Yes (comment) Precaution Comments: pt did not have PT/OT after July back surgery; unfamiliar with precautions Required Braces or Orthoses: Spinal Brace Spinal Brace: Lumbar corset;Applied in sitting position      Mobility  Bed Mobility Overal bed mobility: Needs Assistance Bed Mobility: Rolling;Sidelying to Sit;Sit to Sidelying Rolling: Min guard Sidelying to sit: Min assist     Sit to sidelying: Min assist General bed mobility comments: with rail; vc for correct technique (pt initiated supine to sit; educated on incr strain with this technique)  Transfers Overall transfer level: Needs assistance Equipment used: Rolling walker (2 wheeled) Transfers: Sit to/from Stand Sit to Stand: Min guard         General transfer comment: vc for proper use of RW  Ambulation/Gait Ambulation/Gait assistance: Min guard Ambulation Distance (Feet): 200 Feet Assistive device: Rolling walker (2 wheeled) Gait Pattern/deviations: Step-through pattern;Decreased stride length   Gait velocity interpretation: Below normal speed for age/gender General Gait Details: vc for upright posture and proximity to [employer] Mobility    Modified Rankin (Stroke Patients Only)       Balance                                             Pertinent Vitals/Pain Pain Assessment: 0-10 Pain Score: 4  Pain Location: back Pain Descriptors / Indicators: Operative site guarding Pain Intervention(s): Limited activity within patient's tolerance;Monitored during session;Repositioned;Patient requesting pain meds-RN notified    Home Living Family/patient expects to be discharged to:: Private residence Living Arrangements: Spouse/significant other Available Help at Discharge: Family;Available 24 hours/day Type of Home: House Home Access: Stairs to enter Entrance Stairs-Rails: None Entrance Stairs-Number of Steps: 2 Home Layout: Two level Home Equipment: Environmental consultant - 2 wheels      Prior Function Level of Independence: Independent with assistive device(s)         Comments: time to time still using RW, otherwise independent     Hand Dominance        Extremity/Trunk Assessment   Upper Extremity Assessment: Overall WFL for tasks assessed           Lower Extremity Assessment: Overall WFL for tasks assessed      Cervical / Trunk Assessment: Normal  Communication   Communication: No difficulties  Cognition Arousal/Alertness: Awake/alert Behavior During Therapy: WFL for tasks assessed/performed Overall Cognitive Status: Within Functional Limits for tasks assessed                      General Comments      Exercises        Assessment/Plan  PT Assessment Patient needs continued PT services  PT Diagnosis Difficulty walking;Acute pain   PT Problem List Decreased activity tolerance;Decreased mobility;Decreased knowledge of use of DME;Decreased knowledge of precautions;Obesity;Pain  PT Treatment Interventions DME instruction;Gait training;Stair training;Functional mobility training;Therapeutic activities;Patient/family education   PT Goals (Current goals can be found in the Care  Plan section) Acute Rehab PT Goals Patient Stated Goal: stay in upstairs bedroom with firm mattress PT Goal Formulation: With patient Time For Goal Achievement: 09/04/15 Potential to Achieve Goals: Good    Frequency Min 5X/week   Barriers to discharge        Co-evaluation               End of Session Equipment Utilized During Treatment: Gait belt;Back brace Activity Tolerance: Patient tolerated treatment well Patient left: in bed;with call bell/phone within reach;with bed alarm set;with family/visitor present;with SCD's reapplied Nurse Communication: Patient requests pain meds         Time: [id number] PT Time Calculation (min) (ACUTE ONLY): 34 min   Charges:   PT Evaluation $Initial PT Evaluation Tier I: 1 Procedure PT Treatments $Gait Training: 8-22 mins   PT G Codes:        Ferry Matthis 08/31/2015, 12:39 PM Pager [phone]

## 2015-09-01 MED ORDER — BISACODYL 5 MG PO TBEC
5.0000 mg | DELAYED_RELEASE_TABLET | Freq: Once | ORAL | Status: AC
  Administered 2015-09-01: 5 mg via ORAL
  Filled 2015-09-01: qty 1

## 2015-09-01 NOTE — Care Management Important Message (Signed)
Important Message  Patient Details  Name: Tyler [name] A Marinos MRN: [id number] Date of Birth: 05/07/1946   Medicare Important Message Given:  Yes    Robbie Rideaux P Jenilee Franey 09/01/2015, 2:28 PM

## 2015-09-01 NOTE — Progress Notes (Signed)
Patient ID: Tyler [name], male   DOB: 03/12/1946, 69 y.o.   MRN: [id number] BP 135/64 mmHg  Pulse 78  Temp(Src) 98.6 F (37 C) (Oral)  Resp 16  Ht 5\' 8"  (1.727 m)  Wt 106.142 kg (234 lb)  BMI 35.59 kg/m2  SpO2 95% Alert and oriented x 4 speech is clear and fluent perrl Wound dressing is clean, dry, without signs of infection Moving lower extremities well No bowel movement yet.

## 2015-09-01 NOTE — Progress Notes (Signed)
Physical Therapy Treatment Patient Details Name: Tyler [name] MRN: [id number] DOB: 05/16/1946 Today's Date: 09/01/2015    History of Present Illness s/p L4-5 fusion  PMHx- 04/2015 L4-5 discectomy; HTN; obesity    PT Comments    Patient is progressing well, recalls back precautions.Requests a 3- in -1  Follow Up Recommendations  No PT follow up     Equipment Recommendations  3in1 (PT)    Recommendations for Other Services       Precautions / Restrictions Precautions Precautions: Back Precaution Booklet Issued: Yes (comment) Precaution Comments: able to state 3/3 precautions Required Braces or Orthoses: Spinal Brace Spinal Brace: Lumbar corset;Applied in sitting position    Mobility  Bed Mobility       Sidelying to sit: Min assist     Sit to sidelying: Supervision General bed mobility comments: Pt used rails to roll to side. lifting assist with 1 HHA to sit upright from sidelying  Transfers Overall transfer level: Needs assistance Equipment used: Rolling walker (2 wheeled) Transfers: Sit to/from Stand Sit to Stand: Supervision            Ambulation/Gait Ambulation/Gait assistance: Supervision Ambulation Distance (Feet): 400 Feet Assistive device: Rolling walker (2 wheeled) Gait Pattern/deviations: Step-through pattern     General Gait Details: gait is smoothe and steady   Stairs Stairs:  (declined the nedd to practice today.)          Wheelchair Mobility    Modified Rankin (Stroke Patients Only)       Balance                                    Cognition Arousal/Alertness: Awake/alert                          Exercises      General Comments        Pertinent Vitals/Pain Pain Score: 3  Pain Location: back Pain Descriptors / Indicators: Sore Pain Intervention(s): Monitored during session;Premedicated before session    Home Living                      Prior Function            PT  Goals (current goals can now be found in the care plan section) Progress towards PT goals: Progressing toward goals    Frequency  Min 5X/week    PT Plan Current plan remains appropriate    Co-evaluation             End of Session Equipment Utilized During Treatment: Back brace Activity Tolerance: Patient tolerated treatment well Patient left: in bed;with call bell/phone within reach;with family/visitor present     Time: 1350-1413 PT Time Calculation (min) (ACUTE ONLY): 23 min  Charges:  $Gait Training: 23-37 mins                    G Codes:      Rada HayHill, Tyler [name] 09/01/2015, 2:20 PM [facility] [name] PT [phone]

## 2015-09-02 MED ORDER — SENNOSIDES-DOCUSATE SODIUM 8.6-50 MG PO TABS
2.0000 | ORAL_TABLET | Freq: Once | ORAL | Status: AC
  Administered 2015-09-02: 2 via ORAL
  Filled 2015-09-02: qty 2

## 2015-09-02 MED ORDER — METHOCARBAMOL 500 MG PO TABS: 500.0000 mg | ORAL_TABLET | Freq: Two times a day (BID) | ORAL | Status: DC | PRN

## 2015-09-02 MED ORDER — HYDROCODONE-ACETAMINOPHEN 5-325 MG PO TABS: 1.0000 | ORAL_TABLET | Freq: Four times a day (QID) | ORAL | Status: DC | PRN

## 2015-09-02 NOTE — Discharge Summary (Signed)
Physician Discharge Summary  Patient ID: Tyler [name] MRN: [id number] DOB/AGE: [birth year] 67 y.o.  Admit date: 08/30/2015 Discharge date: 09/02/2015  Admission Diagnoses: Lumbar disc protrusion and stenosis    Discharge Diagnoses: Same   Discharged Condition: good  Hospital Course: The patient was admitted on 08/30/2015 and taken to the operating room where the patient underwent PLIF. The patient tolerated the procedure well and was taken to the recovery room and then to the floor in stable condition. The hospital course was routine. There were no complications. The wound remained clean dry and intact. Pt had appropriate back soreness. No complaints of leg pain or new N/T/W. The patient remained afebrile with stable vital signs, and tolerated a regular diet. The patient continued to increase activities, and pain was well controlled with oral pain medications.   Consults: None  Significant Diagnostic Studies:  Results for orders placed or performed during the hospital encounter of 08/30/15  Glucose, capillary  Result Value Ref Range   Glucose-Capillary 106 (H) 65 - 99 mg/dL    Dg Lumbar Spine 2-3 Views  08/30/2015  CLINICAL DATA:  Operative imaging during lumbar spine fusion surgery. EXAM: DG C-ARM 61-120 MIN; LUMBAR SPINE - 2-3 VIEW COMPARISON:  Operative images obtained earlier this same date FINDINGS: Two portal images show pedicle screws at the L4 and L5 levels. Radiolucent disc spacers are well centered maintaining disc height at L4-L5 and L5-S1. IMPRESSION: Operative portable imaging for lumbar spine surgery. Electronically Signed   By: Amie Portland M.D.   On: 08/30/2015 18:51   Mr Lumbar Spine W Wo Contrast  08/18/2015  CLINICAL DATA:  69 year old male male with lumbar back pain radiating to the left lower extremity as far as the knee. Surgery in July. Left lower extremity weakness. Subsequent encounter. Creatinine was obtained on site at [facility] at [address] Results: Creatinine 1.0 mg/dL. EXAM: MRI LUMBAR SPINE WITHOUT AND WITH CONTRAST TECHNIQUE: Multiplanar and multiecho pulse sequences of the lumbar spine were obtained without and with intravenous contrast. CONTRAST:  20mL MULTIHANCE GADOBENATE DIMEGLUMINE 529 MG/ML IV SOLN COMPARISON:  Lumbar MRI 05/10/2015. Intraoperative radiographs 05/19/2015. FINDINGS: Same numbering system as on the prior MRI. Chronic posterior and interbody fusion changes at L5-S1. Interval postoperative changes on the left at L4-L5, see details below. Retrolisthesis of L4 on L5 appears stable. Endplate marrow edema at L4-L5 has progressed (series 3, image 7). No other acute osseous abnormality. Visualized lower thoracic spinal cord is normal with conus medularis at L1-L2. Stable visualized abdominal viscera. Postoperative changes to the posterior paraspinal soft tissues with no fluid collection. T11-T12 through L2-L3 appear stable and within normal limits. L3-L4: Circumferential disc bulge with superimposed small central disc protrusion with annular fissure (series 6, image 17). Epidural lipomatosis. Mild facet hypertrophy. This level is stable with mild spinal stenosis. L4-L5: Chronic circumferential disc osteophyte complex. Interval left laminectomy. Architectural distortion at the left lateral recess with mildly heterogeneous enhancement. Sagittal images suggest left lateral recess and caudal disc herniation, best seen on series 5, image 8 and series 3, image 8. This involves the descending left L5 nerve roots. Still, thecal sac patency has improved at this level. Trace facet joint fluid on the left is stable. Moderate bilateral L4 foraminal stenosis in large part related to endplate spurring is stable. L5-S1:  Stable appearance of fusion and decompression. IMPRESSION: 1. Postoperative changes on the left at L4-L5 with improved thecal sac patency, but evidence of disc extrusion at the left lateral recess best seen  on sagittal T2 and  STIR series, image 8. Stable moderate L4 biforaminal stenosis. 2. Stable post fusion and decompression changes at L5-S1. Stable multifactorial mild spinal stenosis at L3-L4. Electronically Signed   By: Odessa FlemingH  Hall M.D.   On: 08/18/2015 10:52   Dg Lumbar Spine 1 View  08/30/2015  CLINICAL DATA:  L4-5 posterior fixation EXAM: LUMBAR SPINE - 1 VIEW COMPARISON:  August 10, 2015 FINDINGS: Cross-table lateral lumbar image labeled #1 submitted. Metallic probe tip is posterior to the L4-5 interspace. There is pedicle screw and plate fixation at L5 and S1 with a disc spacer present at L5-S1. There is moderate disc space narrowing at L4-5. No fracture or spondylolisthesis. IMPRESSION: Metallic probe tip posterior to the L4-5 interspace level. Postoperative change at L5-S1, stable. Lower lumbar arthropathy is stable compared to prior study. Electronically Signed   By: Bretta BangWilliam  Woodruff III M.D.   On: 08/30/2015 17:48   Dg C-arm 1-60 Min  08/30/2015  CLINICAL DATA:  Operative imaging during lumbar spine fusion surgery. EXAM: DG C-ARM 61-120 MIN; LUMBAR SPINE - 2-3 VIEW COMPARISON:  Operative images obtained earlier this same date FINDINGS: Two portal images show pedicle screws at the L4 and L5 levels. Radiolucent disc spacers are well centered maintaining disc height at L4-L5 and L5-S1. IMPRESSION: Operative portable imaging for lumbar spine surgery. Electronically Signed   By: Amie Portlandavid  Ormond M.D.   On: 08/30/2015 18:51    Antibiotics:  Anti-infectives    Start     Dose/Rate Route Frequency Ordered Stop   08/30/15 2200  ceFAZolin (ANCEF) IVPB 1 g/50 mL premix     1 g 100 mL/hr over 30 Minutes Intravenous Every 8 hours 08/30/15 2038 08/31/15 0721   08/30/15 1500  ceFAZolin (ANCEF) IVPB 2 g/50 mL premix     2 g 100 mL/hr over 30 Minutes Intravenous On call to O.R. 08/30/15 1311 08/30/15 1521   08/30/15 1314  ceFAZolin (ANCEF) 2-3 GM-% IVPB SOLR    Comments:  Rogelia MireMichael, Cynthia   : cabinet override      08/30/15 1314  08/31/15 0129      Discharge Exam: Blood pressure 127/57, pulse 63, temperature 98.7 F (37.1 C), temperature source Oral, resp. rate 18, height 5\' 8"  (1.727 m), weight 106.142 kg (234 lb), SpO2 98 %. Neurologic: Grossly normal Dressing dry  Discharge Medications:     Medication List    TAKE these medications        acetaminophen 500 MG tablet  Commonly known as:  TYLENOL  Take 500 mg by mouth every 6 (six) hours as needed for mild pain.     amLODipine 5 MG tablet  Commonly known as:  NORVASC  Take 5 mg by mouth daily.     CHROMIUM PO  Take 1 tablet by mouth every morning.     CRESTOR 5 MG tablet  Generic drug:  rosuvastatin  Take 5 mg by mouth at bedtime.     dextromethorphan-guaiFENesin 30-600 MG 12hr tablet  Commonly known as:  MUCINEX DM  Take 1 tablet by mouth 2 (two) times daily as needed for cough.     docusate sodium 100 MG capsule  Commonly known as:  COLACE  Take 200 mg by mouth at bedtime as needed for mild constipation.     HYDROcodone-acetaminophen 5-325 MG tablet  Commonly known as:  NORCO/VICODIN  Take 1-2 tablets by mouth every 6 (six) hours as needed for moderate pain.     methocarbamol 500 MG tablet  Commonly known  as:  ROBAXIN  Take 1 tablet (500 mg total) by mouth 2 (two) times daily as needed for muscle spasms.     multivitamin-iron-minerals-folic acid chewable tablet  Chew 1 tablet by mouth daily.     Potassium 99 MG Tabs  Take 99 mg by mouth daily.     QNASL 80 MCG/ACT Aers  Generic drug:  Beclomethasone Dipropionate  Place 1 spray into both nostrils daily.     triamterene-hydrochlorothiazide 37.5-25 MG tablet  Commonly known as:  MAXZIDE-25  Take 0.5 tablets by mouth daily.     Vitamin D3 2000 UNITS Tabs  Take 2,000 Units by mouth daily.        Disposition: Home   Final Dx: PLIF      Discharge Instructions    Call MD for:  difficulty breathing, headache or visual disturbances    Complete by:  As directed      Call MD  for:  persistant nausea and vomiting    Complete by:  As directed      Call MD for:  redness, tenderness, or signs of infection (pain, swelling, redness, odor or green/yellow discharge around incision site)    Complete by:  As directed      Call MD for:  severe uncontrolled pain    Complete by:  As directed      Call MD for:  temperature >100.4    Complete by:  As directed      Diet - low sodium heart healthy    Complete by:  As directed      Discharge instructions    Complete by:  As directed   No bending or twisting or prolonged sitting, no heavy lifting, no driving, may shower     Increase activity slowly    Complete by:  As directed      Remove dressing in 24 hours    Complete by:  As directed            Follow-up Information    Follow up with CABBELL,KYLE L, MD In 2 weeks.   Specialty:  Neurosurgery   Contact information:   [address] [place] [address] [phone]        Signed: Tia Alert 09/02/2015, 7:22 AM

## 2015-09-02 NOTE — Progress Notes (Signed)
Patient ready for discharge to home; discharge instructions given and reviewed with patient and his wife; discharged out via wheelchair; Rx's given; patient is wearing his back brace.

## 2015-09-20 ENCOUNTER — Ambulatory Visit (INDEPENDENT_AMBULATORY_CARE_PROVIDER_SITE_OTHER): Payer: Medicare Other | Admitting: Neurology

## 2015-09-20 DIAGNOSIS — J309 Allergic rhinitis, unspecified: Secondary | ICD-10-CM

## 2015-09-22 ENCOUNTER — Ambulatory Visit (INDEPENDENT_AMBULATORY_CARE_PROVIDER_SITE_OTHER): Payer: Medicare Other | Admitting: Neurology

## 2015-09-22 DIAGNOSIS — J309 Allergic rhinitis, unspecified: Secondary | ICD-10-CM

## 2015-09-27 ENCOUNTER — Ambulatory Visit (INDEPENDENT_AMBULATORY_CARE_PROVIDER_SITE_OTHER): Payer: Medicare Other | Admitting: Neurology

## 2015-09-27 DIAGNOSIS — J309 Allergic rhinitis, unspecified: Secondary | ICD-10-CM

## 2015-09-29 ENCOUNTER — Ambulatory Visit (INDEPENDENT_AMBULATORY_CARE_PROVIDER_SITE_OTHER): Payer: Medicare Other | Admitting: Neurology

## 2015-09-29 DIAGNOSIS — J309 Allergic rhinitis, unspecified: Secondary | ICD-10-CM

## 2015-10-04 ENCOUNTER — Ambulatory Visit (INDEPENDENT_AMBULATORY_CARE_PROVIDER_SITE_OTHER): Payer: Medicare Other

## 2015-10-04 DIAGNOSIS — J309 Allergic rhinitis, unspecified: Secondary | ICD-10-CM | POA: Diagnosis not present

## 2015-10-06 ENCOUNTER — Ambulatory Visit (INDEPENDENT_AMBULATORY_CARE_PROVIDER_SITE_OTHER): Payer: Medicare Other | Admitting: *Deleted

## 2015-10-06 DIAGNOSIS — J309 Allergic rhinitis, unspecified: Secondary | ICD-10-CM

## 2015-10-18 ENCOUNTER — Ambulatory Visit (INDEPENDENT_AMBULATORY_CARE_PROVIDER_SITE_OTHER): Payer: Medicare Other | Admitting: Neurology

## 2015-10-18 DIAGNOSIS — J309 Allergic rhinitis, unspecified: Secondary | ICD-10-CM

## 2015-10-20 ENCOUNTER — Ambulatory Visit (INDEPENDENT_AMBULATORY_CARE_PROVIDER_SITE_OTHER): Payer: Medicare Other

## 2015-10-20 DIAGNOSIS — J309 Allergic rhinitis, unspecified: Secondary | ICD-10-CM

## 2015-11-22 ENCOUNTER — Ambulatory Visit (INDEPENDENT_AMBULATORY_CARE_PROVIDER_SITE_OTHER): Payer: Medicare Other

## 2015-11-22 DIAGNOSIS — J309 Allergic rhinitis, unspecified: Secondary | ICD-10-CM

## 2015-11-24 ENCOUNTER — Ambulatory Visit (INDEPENDENT_AMBULATORY_CARE_PROVIDER_SITE_OTHER): Payer: Medicare Other | Admitting: Neurology

## 2015-11-24 DIAGNOSIS — J309 Allergic rhinitis, unspecified: Secondary | ICD-10-CM

## 2015-11-29 ENCOUNTER — Ambulatory Visit (INDEPENDENT_AMBULATORY_CARE_PROVIDER_SITE_OTHER): Payer: Medicare Other

## 2015-11-29 DIAGNOSIS — J309 Allergic rhinitis, unspecified: Secondary | ICD-10-CM | POA: Diagnosis not present

## 2015-12-01 ENCOUNTER — Ambulatory Visit (INDEPENDENT_AMBULATORY_CARE_PROVIDER_SITE_OTHER): Payer: Medicare Other | Admitting: *Deleted

## 2015-12-01 DIAGNOSIS — J309 Allergic rhinitis, unspecified: Secondary | ICD-10-CM | POA: Diagnosis not present

## 2015-12-12 ENCOUNTER — Ambulatory Visit (INDEPENDENT_AMBULATORY_CARE_PROVIDER_SITE_OTHER): Payer: Medicare Other

## 2015-12-12 DIAGNOSIS — J309 Allergic rhinitis, unspecified: Secondary | ICD-10-CM | POA: Diagnosis not present

## 2015-12-14 ENCOUNTER — Ambulatory Visit (INDEPENDENT_AMBULATORY_CARE_PROVIDER_SITE_OTHER): Payer: Medicare Other

## 2015-12-14 DIAGNOSIS — J309 Allergic rhinitis, unspecified: Secondary | ICD-10-CM | POA: Diagnosis not present

## 2015-12-20 ENCOUNTER — Ambulatory Visit (INDEPENDENT_AMBULATORY_CARE_PROVIDER_SITE_OTHER): Payer: Medicare Other

## 2015-12-20 DIAGNOSIS — J309 Allergic rhinitis, unspecified: Secondary | ICD-10-CM

## 2015-12-22 ENCOUNTER — Ambulatory Visit (INDEPENDENT_AMBULATORY_CARE_PROVIDER_SITE_OTHER): Payer: Medicare Other | Admitting: *Deleted

## 2015-12-22 DIAGNOSIS — J309 Allergic rhinitis, unspecified: Secondary | ICD-10-CM | POA: Diagnosis not present

## 2016-03-05 ENCOUNTER — Ambulatory Visit (INDEPENDENT_AMBULATORY_CARE_PROVIDER_SITE_OTHER): Payer: Medicare Other

## 2016-03-05 DIAGNOSIS — J309 Allergic rhinitis, unspecified: Secondary | ICD-10-CM | POA: Diagnosis not present

## 2016-03-08 ENCOUNTER — Ambulatory Visit (INDEPENDENT_AMBULATORY_CARE_PROVIDER_SITE_OTHER): Payer: Medicare Other

## 2016-03-08 DIAGNOSIS — J309 Allergic rhinitis, unspecified: Secondary | ICD-10-CM | POA: Diagnosis not present

## 2016-03-12 ENCOUNTER — Ambulatory Visit (INDEPENDENT_AMBULATORY_CARE_PROVIDER_SITE_OTHER): Payer: Medicare Other | Admitting: *Deleted

## 2016-03-12 DIAGNOSIS — J309 Allergic rhinitis, unspecified: Secondary | ICD-10-CM | POA: Diagnosis not present

## 2016-03-13 DIAGNOSIS — J301 Allergic rhinitis due to pollen: Secondary | ICD-10-CM | POA: Diagnosis not present

## 2016-03-14 DIAGNOSIS — J3089 Other allergic rhinitis: Secondary | ICD-10-CM | POA: Diagnosis not present

## 2016-03-15 ENCOUNTER — Ambulatory Visit (INDEPENDENT_AMBULATORY_CARE_PROVIDER_SITE_OTHER): Payer: Medicare Other | Admitting: *Deleted

## 2016-03-15 DIAGNOSIS — J309 Allergic rhinitis, unspecified: Secondary | ICD-10-CM | POA: Diagnosis not present

## 2016-03-19 ENCOUNTER — Ambulatory Visit (INDEPENDENT_AMBULATORY_CARE_PROVIDER_SITE_OTHER): Payer: Medicare Other

## 2016-03-19 DIAGNOSIS — J309 Allergic rhinitis, unspecified: Secondary | ICD-10-CM | POA: Diagnosis not present

## 2016-03-20 DIAGNOSIS — J3081 Allergic rhinitis due to animal (cat) (dog) hair and dander: Secondary | ICD-10-CM | POA: Diagnosis not present

## 2016-03-21 ENCOUNTER — Ambulatory Visit (INDEPENDENT_AMBULATORY_CARE_PROVIDER_SITE_OTHER): Payer: Medicare Other

## 2016-03-21 DIAGNOSIS — J309 Allergic rhinitis, unspecified: Secondary | ICD-10-CM

## 2016-05-07 ENCOUNTER — Ambulatory Visit (INDEPENDENT_AMBULATORY_CARE_PROVIDER_SITE_OTHER): Payer: Medicare Other | Admitting: *Deleted

## 2016-05-07 DIAGNOSIS — J309 Allergic rhinitis, unspecified: Secondary | ICD-10-CM | POA: Diagnosis not present

## 2016-05-29 ENCOUNTER — Ambulatory Visit (INDEPENDENT_AMBULATORY_CARE_PROVIDER_SITE_OTHER): Payer: Medicare Other | Admitting: *Deleted

## 2016-05-29 DIAGNOSIS — J309 Allergic rhinitis, unspecified: Secondary | ICD-10-CM | POA: Diagnosis not present

## 2016-05-31 ENCOUNTER — Ambulatory Visit (INDEPENDENT_AMBULATORY_CARE_PROVIDER_SITE_OTHER): Payer: Medicare Other | Admitting: *Deleted

## 2016-05-31 DIAGNOSIS — J309 Allergic rhinitis, unspecified: Secondary | ICD-10-CM | POA: Diagnosis not present

## 2016-07-09 ENCOUNTER — Ambulatory Visit (INDEPENDENT_AMBULATORY_CARE_PROVIDER_SITE_OTHER): Payer: Medicare Other

## 2016-07-09 DIAGNOSIS — J309 Allergic rhinitis, unspecified: Secondary | ICD-10-CM

## 2016-07-11 ENCOUNTER — Ambulatory Visit (INDEPENDENT_AMBULATORY_CARE_PROVIDER_SITE_OTHER): Payer: Medicare Other

## 2016-07-11 DIAGNOSIS — J309 Allergic rhinitis, unspecified: Secondary | ICD-10-CM

## 2016-07-15 ENCOUNTER — Ambulatory Visit (INDEPENDENT_AMBULATORY_CARE_PROVIDER_SITE_OTHER): Payer: Medicare Other | Admitting: *Deleted

## 2016-07-15 DIAGNOSIS — J309 Allergic rhinitis, unspecified: Secondary | ICD-10-CM | POA: Diagnosis not present

## 2016-07-17 ENCOUNTER — Other Ambulatory Visit: Payer: Self-pay | Admitting: Allergy and Immunology

## 2016-07-17 ENCOUNTER — Telehealth: Payer: Self-pay | Admitting: Allergy and Immunology

## 2016-07-17 ENCOUNTER — Ambulatory Visit (INDEPENDENT_AMBULATORY_CARE_PROVIDER_SITE_OTHER): Payer: Medicare Other | Admitting: *Deleted

## 2016-07-17 DIAGNOSIS — J309 Allergic rhinitis, unspecified: Secondary | ICD-10-CM | POA: Diagnosis not present

## 2016-07-17 NOTE — Telephone Encounter (Signed)
Gave one refill Qnasl and advised appt

## 2016-07-17 NOTE — Telephone Encounter (Signed)
Pt wife called and [facility] [place] [phone].

## 2016-07-23 ENCOUNTER — Ambulatory Visit (INDEPENDENT_AMBULATORY_CARE_PROVIDER_SITE_OTHER): Payer: Medicare Other

## 2016-07-23 DIAGNOSIS — J309 Allergic rhinitis, unspecified: Secondary | ICD-10-CM | POA: Diagnosis not present

## 2016-07-26 ENCOUNTER — Ambulatory Visit (INDEPENDENT_AMBULATORY_CARE_PROVIDER_SITE_OTHER): Payer: Medicare Other | Admitting: *Deleted

## 2016-07-26 DIAGNOSIS — J309 Allergic rhinitis, unspecified: Secondary | ICD-10-CM | POA: Diagnosis not present

## 2016-08-07 ENCOUNTER — Ambulatory Visit (INDEPENDENT_AMBULATORY_CARE_PROVIDER_SITE_OTHER): Payer: Medicare Other

## 2016-08-07 DIAGNOSIS — J309 Allergic rhinitis, unspecified: Secondary | ICD-10-CM

## 2016-08-09 ENCOUNTER — Ambulatory Visit (INDEPENDENT_AMBULATORY_CARE_PROVIDER_SITE_OTHER): Payer: Medicare Other

## 2016-08-09 DIAGNOSIS — J309 Allergic rhinitis, unspecified: Secondary | ICD-10-CM | POA: Diagnosis not present

## 2016-08-27 ENCOUNTER — Ambulatory Visit (INDEPENDENT_AMBULATORY_CARE_PROVIDER_SITE_OTHER): Payer: Medicare Other

## 2016-08-27 DIAGNOSIS — J309 Allergic rhinitis, unspecified: Secondary | ICD-10-CM

## 2016-08-30 ENCOUNTER — Ambulatory Visit (INDEPENDENT_AMBULATORY_CARE_PROVIDER_SITE_OTHER): Payer: Medicare Other

## 2016-08-30 DIAGNOSIS — J309 Allergic rhinitis, unspecified: Secondary | ICD-10-CM

## 2016-09-04 ENCOUNTER — Ambulatory Visit (INDEPENDENT_AMBULATORY_CARE_PROVIDER_SITE_OTHER): Payer: Medicare Other | Admitting: *Deleted

## 2016-09-04 DIAGNOSIS — J309 Allergic rhinitis, unspecified: Secondary | ICD-10-CM | POA: Diagnosis not present

## 2016-09-06 ENCOUNTER — Ambulatory Visit (INDEPENDENT_AMBULATORY_CARE_PROVIDER_SITE_OTHER): Payer: Medicare Other | Admitting: *Deleted

## 2016-09-06 DIAGNOSIS — J309 Allergic rhinitis, unspecified: Secondary | ICD-10-CM | POA: Diagnosis not present

## 2016-09-16 ENCOUNTER — Ambulatory Visit (INDEPENDENT_AMBULATORY_CARE_PROVIDER_SITE_OTHER): Payer: Medicare Other | Admitting: *Deleted

## 2016-09-16 DIAGNOSIS — J309 Allergic rhinitis, unspecified: Secondary | ICD-10-CM

## 2016-09-18 ENCOUNTER — Ambulatory Visit (INDEPENDENT_AMBULATORY_CARE_PROVIDER_SITE_OTHER): Payer: Medicare Other

## 2016-09-18 DIAGNOSIS — J309 Allergic rhinitis, unspecified: Secondary | ICD-10-CM | POA: Diagnosis not present

## 2016-09-25 ENCOUNTER — Ambulatory Visit (INDEPENDENT_AMBULATORY_CARE_PROVIDER_SITE_OTHER): Payer: Medicare Other

## 2016-09-25 DIAGNOSIS — J309 Allergic rhinitis, unspecified: Secondary | ICD-10-CM

## 2016-09-27 ENCOUNTER — Ambulatory Visit (INDEPENDENT_AMBULATORY_CARE_PROVIDER_SITE_OTHER): Payer: Medicare Other

## 2016-09-27 DIAGNOSIS — J309 Allergic rhinitis, unspecified: Secondary | ICD-10-CM

## 2016-10-02 ENCOUNTER — Ambulatory Visit (INDEPENDENT_AMBULATORY_CARE_PROVIDER_SITE_OTHER): Payer: Medicare Other

## 2016-10-02 DIAGNOSIS — J309 Allergic rhinitis, unspecified: Secondary | ICD-10-CM

## 2016-10-04 ENCOUNTER — Ambulatory Visit (INDEPENDENT_AMBULATORY_CARE_PROVIDER_SITE_OTHER): Payer: Medicare Other | Admitting: *Deleted

## 2016-10-04 DIAGNOSIS — J309 Allergic rhinitis, unspecified: Secondary | ICD-10-CM

## 2016-10-07 ENCOUNTER — Ambulatory Visit (INDEPENDENT_AMBULATORY_CARE_PROVIDER_SITE_OTHER): Payer: Medicare Other | Admitting: *Deleted

## 2016-10-07 DIAGNOSIS — J309 Allergic rhinitis, unspecified: Secondary | ICD-10-CM

## 2016-10-10 ENCOUNTER — Ambulatory Visit (INDEPENDENT_AMBULATORY_CARE_PROVIDER_SITE_OTHER): Payer: Medicare Other | Admitting: *Deleted

## 2016-10-10 DIAGNOSIS — J309 Allergic rhinitis, unspecified: Secondary | ICD-10-CM

## 2016-10-23 ENCOUNTER — Ambulatory Visit (INDEPENDENT_AMBULATORY_CARE_PROVIDER_SITE_OTHER): Payer: Medicare Other

## 2016-10-23 DIAGNOSIS — J309 Allergic rhinitis, unspecified: Secondary | ICD-10-CM | POA: Diagnosis not present

## 2016-11-05 ENCOUNTER — Ambulatory Visit (INDEPENDENT_AMBULATORY_CARE_PROVIDER_SITE_OTHER): Payer: Medicare Other | Admitting: *Deleted

## 2016-11-05 DIAGNOSIS — J309 Allergic rhinitis, unspecified: Secondary | ICD-10-CM

## 2016-11-09 NOTE — Addendum Note (Signed)
Addended by: Maryjean MornFREEMAN, Simonne Boulos D on: 11/09/2016 10:30 AM   Modules accepted: Orders

## 2016-11-12 ENCOUNTER — Ambulatory Visit (INDEPENDENT_AMBULATORY_CARE_PROVIDER_SITE_OTHER): Payer: Medicare Other | Admitting: *Deleted

## 2016-11-12 DIAGNOSIS — J309 Allergic rhinitis, unspecified: Secondary | ICD-10-CM | POA: Diagnosis not present

## 2016-11-15 ENCOUNTER — Ambulatory Visit (INDEPENDENT_AMBULATORY_CARE_PROVIDER_SITE_OTHER): Payer: Medicare Other

## 2016-11-15 DIAGNOSIS — J309 Allergic rhinitis, unspecified: Secondary | ICD-10-CM

## 2016-11-26 ENCOUNTER — Ambulatory Visit (INDEPENDENT_AMBULATORY_CARE_PROVIDER_SITE_OTHER): Payer: Medicare Other

## 2016-11-26 DIAGNOSIS — J309 Allergic rhinitis, unspecified: Secondary | ICD-10-CM | POA: Diagnosis not present

## 2016-12-06 ENCOUNTER — Ambulatory Visit (INDEPENDENT_AMBULATORY_CARE_PROVIDER_SITE_OTHER): Payer: Medicare Other

## 2016-12-06 DIAGNOSIS — J309 Allergic rhinitis, unspecified: Secondary | ICD-10-CM

## 2016-12-12 ENCOUNTER — Ambulatory Visit (INDEPENDENT_AMBULATORY_CARE_PROVIDER_SITE_OTHER): Payer: Medicare Other

## 2016-12-12 DIAGNOSIS — J309 Allergic rhinitis, unspecified: Secondary | ICD-10-CM

## 2016-12-19 ENCOUNTER — Ambulatory Visit (INDEPENDENT_AMBULATORY_CARE_PROVIDER_SITE_OTHER): Payer: Medicare Other | Admitting: *Deleted

## 2016-12-19 DIAGNOSIS — J309 Allergic rhinitis, unspecified: Secondary | ICD-10-CM

## 2016-12-26 ENCOUNTER — Ambulatory Visit (INDEPENDENT_AMBULATORY_CARE_PROVIDER_SITE_OTHER): Payer: Medicare Other

## 2016-12-26 DIAGNOSIS — J309 Allergic rhinitis, unspecified: Secondary | ICD-10-CM | POA: Diagnosis not present

## 2017-01-02 ENCOUNTER — Ambulatory Visit (INDEPENDENT_AMBULATORY_CARE_PROVIDER_SITE_OTHER): Payer: Medicare Other | Admitting: *Deleted

## 2017-01-02 DIAGNOSIS — J309 Allergic rhinitis, unspecified: Secondary | ICD-10-CM

## 2017-01-08 DIAGNOSIS — J301 Allergic rhinitis due to pollen: Secondary | ICD-10-CM

## 2017-01-09 ENCOUNTER — Ambulatory Visit (INDEPENDENT_AMBULATORY_CARE_PROVIDER_SITE_OTHER): Payer: Medicare Other | Admitting: *Deleted

## 2017-01-09 DIAGNOSIS — J309 Allergic rhinitis, unspecified: Secondary | ICD-10-CM

## 2017-01-10 DIAGNOSIS — J3089 Other allergic rhinitis: Secondary | ICD-10-CM | POA: Diagnosis not present

## 2017-01-15 ENCOUNTER — Ambulatory Visit (INDEPENDENT_AMBULATORY_CARE_PROVIDER_SITE_OTHER): Payer: Medicare Other

## 2017-01-15 DIAGNOSIS — J309 Allergic rhinitis, unspecified: Secondary | ICD-10-CM

## 2017-01-23 ENCOUNTER — Ambulatory Visit (INDEPENDENT_AMBULATORY_CARE_PROVIDER_SITE_OTHER): Payer: Medicare Other | Admitting: *Deleted

## 2017-01-23 DIAGNOSIS — J309 Allergic rhinitis, unspecified: Secondary | ICD-10-CM

## 2017-01-30 ENCOUNTER — Ambulatory Visit (INDEPENDENT_AMBULATORY_CARE_PROVIDER_SITE_OTHER): Payer: Medicare Other | Admitting: *Deleted

## 2017-01-30 DIAGNOSIS — J309 Allergic rhinitis, unspecified: Secondary | ICD-10-CM | POA: Diagnosis not present

## 2017-02-06 ENCOUNTER — Ambulatory Visit (INDEPENDENT_AMBULATORY_CARE_PROVIDER_SITE_OTHER): Payer: Medicare Other

## 2017-02-06 DIAGNOSIS — J309 Allergic rhinitis, unspecified: Secondary | ICD-10-CM

## 2017-02-12 ENCOUNTER — Ambulatory Visit (INDEPENDENT_AMBULATORY_CARE_PROVIDER_SITE_OTHER): Payer: Medicare Other | Admitting: *Deleted

## 2017-02-12 DIAGNOSIS — J309 Allergic rhinitis, unspecified: Secondary | ICD-10-CM

## 2017-02-20 ENCOUNTER — Ambulatory Visit (INDEPENDENT_AMBULATORY_CARE_PROVIDER_SITE_OTHER): Payer: Medicare Other

## 2017-02-20 DIAGNOSIS — J309 Allergic rhinitis, unspecified: Secondary | ICD-10-CM

## 2017-02-27 ENCOUNTER — Ambulatory Visit (INDEPENDENT_AMBULATORY_CARE_PROVIDER_SITE_OTHER): Payer: Medicare Other | Admitting: *Deleted

## 2017-02-27 DIAGNOSIS — J309 Allergic rhinitis, unspecified: Secondary | ICD-10-CM

## 2017-03-06 ENCOUNTER — Encounter (INDEPENDENT_AMBULATORY_CARE_PROVIDER_SITE_OTHER): Payer: Medicare Other | Admitting: Family Medicine

## 2017-03-07 ENCOUNTER — Ambulatory Visit (INDEPENDENT_AMBULATORY_CARE_PROVIDER_SITE_OTHER): Payer: Medicare Other

## 2017-03-07 DIAGNOSIS — J309 Allergic rhinitis, unspecified: Secondary | ICD-10-CM

## 2017-03-13 ENCOUNTER — Ambulatory Visit (INDEPENDENT_AMBULATORY_CARE_PROVIDER_SITE_OTHER): Payer: Medicare Other | Admitting: *Deleted

## 2017-03-13 DIAGNOSIS — J309 Allergic rhinitis, unspecified: Secondary | ICD-10-CM

## 2017-03-20 ENCOUNTER — Ambulatory Visit (INDEPENDENT_AMBULATORY_CARE_PROVIDER_SITE_OTHER): Payer: Medicare Other | Admitting: *Deleted

## 2017-03-20 DIAGNOSIS — J309 Allergic rhinitis, unspecified: Secondary | ICD-10-CM

## 2017-03-28 ENCOUNTER — Ambulatory Visit (INDEPENDENT_AMBULATORY_CARE_PROVIDER_SITE_OTHER): Payer: Medicare Other | Admitting: *Deleted

## 2017-03-28 DIAGNOSIS — J309 Allergic rhinitis, unspecified: Secondary | ICD-10-CM

## 2017-04-01 ENCOUNTER — Ambulatory Visit (INDEPENDENT_AMBULATORY_CARE_PROVIDER_SITE_OTHER): Payer: Medicare Other | Admitting: Family Medicine

## 2017-04-01 ENCOUNTER — Encounter (INDEPENDENT_AMBULATORY_CARE_PROVIDER_SITE_OTHER): Payer: Self-pay | Admitting: Family Medicine

## 2017-04-01 VITALS — BP 138/84 | HR 55 | Temp 98.0°F | Ht 65.0 in | Wt 236.0 lb

## 2017-04-01 DIAGNOSIS — Z0289 Encounter for other administrative examinations: Secondary | ICD-10-CM

## 2017-04-01 DIAGNOSIS — Z6839 Body mass index (BMI) 39.0-39.9, adult: Secondary | ICD-10-CM

## 2017-04-01 DIAGNOSIS — Z1389 Encounter for screening for other disorder: Secondary | ICD-10-CM

## 2017-04-01 DIAGNOSIS — Z1331 Encounter for screening for depression: Secondary | ICD-10-CM

## 2017-04-01 DIAGNOSIS — E784 Other hyperlipidemia: Secondary | ICD-10-CM | POA: Diagnosis not present

## 2017-04-01 DIAGNOSIS — E559 Vitamin D deficiency, unspecified: Secondary | ICD-10-CM | POA: Diagnosis not present

## 2017-04-01 DIAGNOSIS — E7849 Other hyperlipidemia: Secondary | ICD-10-CM

## 2017-04-01 DIAGNOSIS — I10 Essential (primary) hypertension: Secondary | ICD-10-CM

## 2017-04-01 DIAGNOSIS — R5383 Other fatigue: Secondary | ICD-10-CM | POA: Diagnosis not present

## 2017-04-01 DIAGNOSIS — E669 Obesity, unspecified: Secondary | ICD-10-CM

## 2017-04-01 DIAGNOSIS — R0609 Other forms of dyspnea: Secondary | ICD-10-CM

## 2017-04-01 NOTE — Progress Notes (Signed)
Office: [phone]  /  Fax: [phone]   HPI:   Chief Complaint: OBESITY  Tyler [name] (MR# [id number]) is a 71 y.o. male who presents on 04/01/2017 for obesity evaluation and treatment. Current BMI is Body mass index is 39.27 kg/m.Tyler [name] has struggled with obesity for years and has been unsuccessful in either losing weight or maintaining long term weight loss. Tyler [name] attended our information session and states he is currently in the action stage of change and ready to dedicate time achieving and maintaining a healthier weight.  Tyler [name] states his family eats meals together he thinks his family will eat healthier with him his desired weight loss is 52 lbs he started gaining weight 15 yrs ago, age 36 his heaviest weight ever was 248 lbs. he snacks frequently in the evenings he skips meals frequently he is trying to eat vegetarian he is frequently drinking liquids with calories he has binge eating behaviors   Fatigue Tyler [name] feels his energy is lower than it should be. This has worsened with weight gain and has not worsened recently. [name] admits to daytime somnolence and  admits to waking up still tired. Patient is at risk for obstructive sleep apnea. Patent has a history of symptoms of daytime fatigue and hypertension. Patient generally gets 6 or 7 hours of sleep per night, and states they generally have restless sleep. Snoring is present. Apneic episodes are not present. Epworth Sleepiness Score is 9  Dyspnea on exertion Tyler [name] notes increasing shortness of breath with exercising and seems to be worsening over time with weight gain. He notes getting out of breath sooner with activity than he used to. This has not gotten worse recently. Tyler [name] denies orthopnea.  Vitamin D deficiency Tyler [name] has a diagnosis of vitamin D deficiency. He is currently taking OTC vit D, no recent labs done. He admits fatigue and denies nausea, vomiting or muscle weakness.  Hypertension Tyler Joiner  [name] is a 71 y.o. male with hypertension.  Tyler [name] denies chest pain. He is working weight loss to help control his blood [name] with the goal of decreasing his risk of heart attack and stroke. Tyler [name] is controlled today on amlodipine and triamterene/HCTZ .  Hyperlipidemia Tyler [name] has hyperlipidemia and is currently taking crestor, no recent labs. He denies any chest pain, claudication or myalgias.  Depression Screen Tyler [name]'s Food and Mood (modified PHQ-9) score was  Depression screen PHQ 2/9 04/01/2017  Decreased Interest 2  Down, Depressed, Hopeless 1  PHQ - 2 Score 3  Altered sleeping 2  Tired, decreased energy 0  Change in appetite 0  Trouble concentrating 0  Moving slowly or fidgety/restless 0  Suicidal thoughts 0  PHQ-9 Score 5    ALLERGIES: No Known Allergies  MEDICATIONS: Current Outpatient Prescriptions on File Prior to Visit  Medication Sig Dispense Refill  . acetaminophen (TYLENOL) 500 MG tablet Take 500 mg by mouth every 6 (six) hours as needed for mild pain.    Marland Kitchen amLODipine (NORVASC) 5 MG tablet Take 5 mg by mouth daily.    . Cholecalciferol (VITAMIN D3) 2000 UNITS TABS Take 2,000 Units by mouth daily.    . CRESTOR 5 MG tablet Take 5 mg by mouth at bedtime.  1  . dextromethorphan-guaiFENesin (MUCINEX DM) 30-600 MG 12hr tablet Take 1 tablet by mouth 2 (two) times daily as needed for cough.    . multivitamin-iron-minerals-folic acid (CENTRUM) chewable tablet Chew 1 tablet by mouth daily.    . Potassium 99 MG TABS Take  99 mg by mouth daily.    . QNASL 80 MCG/ACT AERS ONE SPRAY EACH NOSTRIL TWICE DAILY FOR STUFFY NOSE OR DRAINAGE. 8.7 Inhaler 0  . triamterene-hydrochlorothiazide (MAXZIDE-25) 37.5-25 MG per tablet Take 0.5 tablets by mouth daily.     . CHROMIUM PO Take 1 tablet by mouth every morning.    . docusate sodium (COLACE) 100 MG capsule Take 200 mg by mouth at bedtime as needed for mild constipation.    Marland Kitchen  HYDROcodone-acetaminophen (NORCO/VICODIN) 5-325 MG tablet Take 1-2 tablets by mouth every 6 (six) hours as needed for moderate pain. (Patient not taking: Reported on 04/01/2017) 80 tablet 0  . methocarbamol (ROBAXIN) 500 MG tablet Take 1 tablet (500 mg total) by mouth 2 (two) times daily as needed for muscle spasms. (Patient not taking: Reported on 04/01/2017) 60 tablet 1   No current facility-administered medications on file prior to visit.     PAST MEDICAL HISTORY: Past Medical History:  Diagnosis Date  . Back pain   . Heartburn   . Hyperlipidemia   . Hypertension   . Insulin resistance   . Obesity     PAST SURGICAL HISTORY: Past Surgical History:  Procedure Laterality Date  . BACK SURGERY     03/14/2004  . EYE SURGERY     bil cataract  02/2013  . LUMBAR LAMINECTOMY/DECOMPRESSION MICRODISCECTOMY N/A 05/19/2015   Procedure: Lumbar four- five diskectomy;  Surgeon: Coletta Memos, MD;  Location: [facility];  Service: Neurosurgery;  Laterality: N/A;  L45 diskectomy    SOCIAL HISTORY: Social History  Substance Use Topics  . Smoking status: Former Games developer  . Smokeless tobacco: Never Used     Comment: quit smokling in '70s  . Alcohol use 2.4 oz/week    4 Shots of liquor per week     Comment: social     FAMILY HISTORY: Family History  Problem Relation Age of Onset  . Hypertension Father   . Hyperlipidemia Father   . Heart disease Father   . Obesity Father     ROS: Review of Systems  Constitutional: Positive for malaise/fatigue.  HENT:       Dentures  Respiratory: Positive for shortness of breath (on exertion).   Cardiovascular: Negative for chest pain, orthopnea and claudication.  Gastrointestinal: Negative for nausea and vomiting.  Musculoskeletal: Negative for myalgias.       Negative muscle weakness  Skin: Positive for itching.       dryness  Endo/Heme/Allergies:       Hay fever  Psychiatric/Behavioral: The patient has insomnia.     PHYSICAL EXAM: Blood  [name] 138/84, pulse (!) 55, temperature 98 [name] (36.7 C), temperature source Oral, height 5\' 5"  (1.651 m), weight 236 lb (107 kg), SpO2 97 %. Body mass index is 39.27 kg/m. Physical Exam  Constitutional: He is oriented to person, place, and time. He appears well-developed and well-nourished.  Cardiovascular: Normal rate.   Pulmonary/Chest: Effort normal.  Musculoskeletal: Normal range of motion.  Neurological: He is oriented to person, place, and time.  Skin: Skin is warm and dry.  Psychiatric: He has a normal mood and affect. His behavior is normal.  Vitals reviewed.   RECENT LABS AND TESTS: BMET    Component Value Date/Time   NA 138 08/25/2015 1342   NA 142 02/27/2012   K 3.9 08/25/2015 1342   CL 108 08/25/2015 1342   CO2 23 08/25/2015 1342   GLUCOSE 112 (H) 08/25/2015 1342   BUN 14 08/25/2015 1342   BUN  15 02/27/2012   CREATININE 1.02 08/25/2015 1342   CALCIUM 10.4 (H) 08/25/2015 1342   GFRNONAA >60 08/25/2015 1342   GFRAA >60 08/25/2015 1342   No results found for: HGBA1C No results found for: INSULIN CBC    Component Value Date/Time   WBC 6.6 08/25/2015 1342   RBC 4.69 08/25/2015 1342   HGB 14.0 08/25/2015 1342   HCT 42.2 08/25/2015 1342   PLT 271 08/25/2015 1342   MCV 90.0 08/25/2015 1342   MCH 29.9 08/25/2015 1342   MCHC 33.2 08/25/2015 1342   RDW 13.8 08/25/2015 1342   Iron/TIBC/Ferritin/ %Sat No results found for: IRON, TIBC, FERRITIN, IRONPCTSAT Lipid Panel  No results found for: CHOL, TRIG, HDL, CHOLHDL, VLDL, LDLCALC, LDLDIRECT Hepatic Function Panel     Component Value Date/Time   AST 16 02/27/2012   ALT 18 02/27/2012   No results found for: TSH  ECG  shows NSR with a rate of 52/53 BPM INDIRECT CALORIMETER done today shows a VO2 of 173 and a REE of 1207.    ASSESSMENT AND PLAN: Other fatigue - Plan: EKG 12-Lead, Comprehensive metabolic panel, CBC With Differential, Hemoglobin A1c, Insulin, random, Vitamin B12, Folate, T3, T4, free,  TSH  Dyspnea on exertion  Essential hypertension - Plan: Comprehensive metabolic panel  Other hyperlipidemia - Plan: Lipid Panel With LDL/HDL Ratio  Vitamin D deficiency - Plan: VITAMIN D 25 Hydroxy (Vit-D Deficiency, Fractures)  Depression screening  Class 2 obesity without serious comorbidity with body mass index (BMI) of 39.0 to 39.9 in adult, unspecified obesity type  PLAN:  Fatigue Tyler [name] that his fatigue may be related to obesity, depression or many other causes. Labs will be ordered, and in the meanwhile Tyler [name] has [name] to work on diet, exercise and weight loss to help with fatigue. Proper sleep hygiene was discussed including the need for 7-8 hours of quality sleep each night. A sleep study was not ordered based on symptoms and Epworth score.  Dyspnea on exertion Tyler [name]'s shortness of breath appears to be obesity related and exercise induced. He has [name] to work on weight loss and gradually increase exercise to treat his exercise induced shortness of breath. If Tyler [name] follows our instructions and loses weight without improvement of his shortness of breath, we will plan to refer to pulmonology. We will monitor this condition regularly. Tyler [name] to this plan.  Vitamin D Deficiency Tyler [name] that low vitamin D levels contributes to fatigue and are associated with obesity, breast, and colon cancer. He [name] to continue to take OTC Vit D @20 ,000 IU every day and we will check labs and will follow up for routine testing of vitamin D, at least 2-3 times per year. He was [name] of the risk of over-replacement of vitamin D and [name] to not increase his dose unless he discusses this with Tyler [name]. Tyler [name] to follow up with our clinic in 2 weeks.  Hypertension We discussed sodium restriction, working on healthy weight loss, and a regular exercise program as the means to achieve improved blood [name] control. Tyler [name] with this plan and [name] to follow  up as directed. We will check labs and we will continue to monitor his blood [name] as well as his progress with the above lifestyle modifications. He will continue his medications as prescribed and will watch for signs of hypotension as he continues his lifestyle modifications.  Hyperlipidemia Tyler [name] of the American Heart Association Guidelines emphasizing intensive lifestyle modifications as the [name]  line treatment for hyperlipidemia. We discussed many lifestyle modifications today in depth, and Maximilian will continue to work on decreasing saturated fats such as fatty red meat, butter and many fried foods. He will also increase vegetables and lean protein in his diet and continue to work on exercise and weight loss efforts. We will check labs and Zayed [name] to follow up with our clinic in 2 weeks.  Depression Screen Carmeron had a mildly positive depression screening. Depression is commonly associated with obesity and often results in emotional eating behaviors. We will monitor this closely and work on CBT to help improve the non-hunger eating patterns. Referral to Psychology may be required if no improvement is seen as he continues in our clinic.  Obesity Cleatus is currently in the action stage of change and his goal is to continue with weight loss efforts He has [name] to follow the Category 3 plan Claris has been instructed to work up to a goal of 150 minutes of combined cardio and strengthening exercise per week for weight loss and overall health benefits. We discussed the following Behavioral Modification Strategies today: meal planning & cooking strategies, increasing lean protein intake and increasing vegetables  Tierra has [name] to follow up with our clinic in 2 weeks. He was [name] of the importance of frequent follow up visits to maximize his success with intensive lifestyle modifications for his multiple health conditions. He was [name] we would discuss his lab results at his  next visit unless there is a critical issue that needs to be addressed sooner. Kenith [name] to keep his next visit at the [name] upon time to discuss these results.  I, Nevada Crane, am acting as scribe for Quillian Quince, MD  I have reviewed the above documentation for accuracy and completeness, and I agree with the above. -Quillian Quince, MD  OBESITY BEHAVIORAL INTERVENTION VISIT  Today's visit was # 1 out of 22.  Starting weight: 236 lbs Starting date: 04/01/17 Today's weight : 236 lbs Today's date: 04/01/2017 Total lbs lost to date: 0 (Patients must lose 7 lbs in the [name] 6 months to continue with counseling)   ASK: We discussed the diagnosis of obesity with Rudy Jew Barona today and Kingjames [name] to give [place] permission to discuss obesity behavioral modification therapy today.  ASSESS: Muath has the diagnosis of obesity and his BMI today is 39.4 Ismael is in the action stage of change   ADVISE: Wake was educated on the multiple health risks of obesity as well as the benefit of weight loss to improve his health. He was advised of the need for long term treatment and the importance of lifestyle modifications.  AGREE: Multiple dietary modification options and treatment options were discussed and  Enes [name] to follow the Category 3 plan We discussed the following Behavioral Modification Strategies today: meal planning & cooking strategies, increasing lean protein intake and increasing vegetables

## 2017-04-02 LAB — CBC WITH DIFFERENTIAL
BASOS: 1 %
Basophils Absolute: 0 10*3/uL (ref 0.0–0.2)
EOS (ABSOLUTE): 0.1 10*3/uL (ref 0.0–0.4)
EOS: 2 %
HEMATOCRIT: 42.7 % (ref 37.5–51.0)
Hemoglobin: 14.5 g/dL (ref 13.0–17.7)
Immature Grans (Abs): 0 10*3/uL (ref 0.0–0.1)
Immature Granulocytes: 0 %
LYMPHS ABS: 2.4 10*3/uL (ref 0.7–3.1)
Lymphs: 44 %
MCH: 30.1 pg (ref 26.6–33.0)
MCHC: 34 g/dL (ref 31.5–35.7)
MCV: 89 fL (ref 79–97)
MONOS ABS: 0.4 10*3/uL (ref 0.1–0.9)
Monocytes: 6 %
NEUTROS ABS: 2.5 10*3/uL (ref 1.4–7.0)
Neutrophils: 47 %
RBC: 4.82 x10E6/uL (ref 4.14–5.80)
RDW: 14.1 % (ref 12.3–15.4)
WBC: 5.4 10*3/uL (ref 3.4–10.8)

## 2017-04-02 LAB — COMPREHENSIVE METABOLIC PANEL
ALT: 14 IU/L (ref 0–44)
AST: 13 IU/L (ref 0–40)
Albumin/Globulin Ratio: 1.5 (ref 1.2–2.2)
Albumin: 4.1 g/dL (ref 3.6–4.8)
Alkaline Phosphatase: 63 IU/L (ref 39–117)
BILIRUBIN TOTAL: 0.2 mg/dL (ref 0.0–1.2)
BUN / CREAT RATIO: 12 (ref 10–24)
BUN: 12 mg/dL (ref 8–27)
CO2: 25 mmol/L (ref 20–29)
CREATININE: 0.98 mg/dL (ref 0.76–1.27)
Calcium: 9.7 mg/dL (ref 8.6–10.2)
Chloride: 103 mmol/L (ref 96–106)
GFR calc non Af Amer: 79 mL/min/{1.73_m2} (ref >59)
GFR, EST AFRICAN AMERICAN: 91 mL/min/{1.73_m2} (ref >59)
GLUCOSE: 114 mg/dL — AB (ref 65–99)
Globulin, Total: 2.7 g/dL (ref 1.5–4.5)
Potassium: 4.4 mmol/L (ref 3.5–5.2)
Sodium: 141 mmol/L (ref 134–144)
TOTAL PROTEIN: 6.8 g/dL (ref 6.0–8.5)

## 2017-04-02 LAB — LIPID PANEL WITH LDL/HDL RATIO
Cholesterol, Total: 233 mg/dL — ABNORMAL HIGH (ref 100–199)
HDL: 59 mg/dL (ref >39)
LDL Calculated: 148 mg/dL — ABNORMAL HIGH (ref 0–99)
LDL/HDL RATIO: 2.5 ratio (ref 0.0–3.6)
Triglycerides: 130 mg/dL (ref 0–149)
VLDL CHOLESTEROL CAL: 26 mg/dL (ref 5–40)

## 2017-04-02 LAB — T3: T3, Total: 91 ng/dL (ref 71–180)

## 2017-04-02 LAB — HEMOGLOBIN A1C
Est. average glucose Bld gHb Est-mCnc: 137 mg/dL
Hgb A1c MFr Bld: 6.4 % — ABNORMAL HIGH (ref 4.8–5.6)

## 2017-04-02 LAB — FOLATE: Folate: >20 ng/mL (ref >3.0)

## 2017-04-02 LAB — T4, FREE: FREE T4: 0.94 ng/dL (ref 0.82–1.77)

## 2017-04-02 LAB — VITAMIN D 25 HYDROXY (VIT D DEFICIENCY, FRACTURES): VIT D 25 HYDROXY: 31.1 ng/mL (ref 30.0–100.0)

## 2017-04-02 LAB — TSH: TSH: 1.35 u[IU]/mL (ref 0.450–4.500)

## 2017-04-02 LAB — VITAMIN B12: Vitamin B-12: 538 pg/mL (ref 232–1245)

## 2017-04-02 LAB — INSULIN, RANDOM: INSULIN: 14.6 u[IU]/mL (ref 2.6–24.9)

## 2017-04-03 ENCOUNTER — Ambulatory Visit (INDEPENDENT_AMBULATORY_CARE_PROVIDER_SITE_OTHER): Payer: Medicare Other | Admitting: *Deleted

## 2017-04-03 DIAGNOSIS — J309 Allergic rhinitis, unspecified: Secondary | ICD-10-CM

## 2017-04-11 ENCOUNTER — Ambulatory Visit (INDEPENDENT_AMBULATORY_CARE_PROVIDER_SITE_OTHER): Payer: Medicare Other

## 2017-04-11 DIAGNOSIS — J309 Allergic rhinitis, unspecified: Secondary | ICD-10-CM

## 2017-04-15 ENCOUNTER — Ambulatory Visit (INDEPENDENT_AMBULATORY_CARE_PROVIDER_SITE_OTHER): Payer: Medicare Other | Admitting: Family Medicine

## 2017-04-15 VITALS — BP 145/80 | HR 53 | Temp 98.2°F | Ht 65.0 in | Wt 230.0 lb

## 2017-04-15 DIAGNOSIS — Z6838 Body mass index (BMI) 38.0-38.9, adult: Secondary | ICD-10-CM

## 2017-04-15 DIAGNOSIS — E785 Hyperlipidemia, unspecified: Secondary | ICD-10-CM

## 2017-04-15 DIAGNOSIS — E559 Vitamin D deficiency, unspecified: Secondary | ICD-10-CM | POA: Diagnosis not present

## 2017-04-15 DIAGNOSIS — R7303 Prediabetes: Secondary | ICD-10-CM

## 2017-04-15 DIAGNOSIS — E669 Obesity, unspecified: Secondary | ICD-10-CM | POA: Diagnosis not present

## 2017-04-15 MED ORDER — VITAMIN D (ERGOCALCIFEROL) 1.25 MG (50000 UNIT) PO CAPS: 50000.0000 [IU] | ORAL_CAPSULE | ORAL | 0 refills | Status: DC

## 2017-04-15 MED ORDER — METFORMIN HCL 500 MG PO TABS: 500.0000 mg | ORAL_TABLET | Freq: Every morning | ORAL | 0 refills | Status: DC

## 2017-04-15 NOTE — Progress Notes (Signed)
Office: [phone]  /  Fax: [phone]   HPI:   Chief Complaint: OBESITY Tyler [name] is here to discuss his progress with his obesity treatment plan. He is on the  follow the Category 3 plan and is following his eating plan approximately 100 % of the time. He states he is exercising golf, yard work, horses 2 to3 times per week. [name] has done very well with weight loss on category 3 plan, hunger is mostly controlled. His wife is prepping his food and he is happy with his food choices. His weight is 230 lb (104.3 kg) today and has had a weight loss of 6 pounds over a period of 2 weeks since his last visit. He has lost 6 lbs since starting treatment with [place].  Vitamin D deficiency [name] has a diagnosis of vitamin D deficiency. He is not currently taking vit D, level okay but not yet at goal. He admits fatigue and denies nausea, vomiting or muscle weakness.  Pre-Diabetes Tyler [name] has a diagnosis of pre-diabetes, very close to diabetes based on his elevated Hgb A1c at 6.4 and was informed this puts him at greater risk of developing diabetes. He is not taking metformin currently and continues to work on diet and exercise to decrease risk of diabetes. He has decreased polyphagia and denies nausea or hypoglycemia. [name] is on a lower simple carbohydrate diet.  Hyperlipidemia [name] has hyperlipidemia and is currently on crestor 5 mg and he is not yet at goal. [name] has been trying to improve his cholesterol levels with intensive lifestyle modification including a low saturated fat diet, exercise and weight loss. He denies any chest pain, claudication or myalgias.   ALLERGIES: No Known Allergies  MEDICATIONS: Current Outpatient Prescriptions on File Prior to Visit  Medication Sig Dispense Refill  . acetaminophen (TYLENOL) 500 MG tablet Take 500 mg by mouth every 6 (six) hours as needed for mild pain.    Marland Kitchen amLODipine (NORVASC) 5 MG tablet Take 5 mg by mouth daily.    . Cholecalciferol (VITAMIN D3)  2000 UNITS TABS Take 2,000 Units by mouth daily.    . CHROMIUM PO Take 1 tablet by mouth every morning.    Marland Kitchen CRESTOR 5 MG tablet Take 5 mg by mouth at bedtime.  1  . dextromethorphan-guaiFENesin (MUCINEX DM) 30-600 MG 12hr tablet Take 1 tablet by mouth 2 (two) times daily as needed for cough.    . docusate sodium (COLACE) 100 MG capsule Take 200 mg by mouth at bedtime as needed for mild constipation.    Marland Kitchen HYDROcodone-acetaminophen (NORCO/VICODIN) 5-325 MG tablet Take 1-2 tablets by mouth every 6 (six) hours as needed for moderate pain. 80 tablet 0  . methocarbamol (ROBAXIN) 500 MG tablet Take 1 tablet (500 mg total) by mouth 2 (two) times daily as needed for muscle spasms. 60 tablet 1  . multivitamin-iron-minerals-folic acid (CENTRUM) chewable tablet Chew 1 tablet by mouth daily.    . Potassium 99 MG TABS Take 99 mg by mouth daily.    . QNASL 80 MCG/ACT AERS ONE SPRAY EACH NOSTRIL TWICE DAILY FOR STUFFY NOSE OR DRAINAGE. 8.7 Inhaler 0  . triamterene-hydrochlorothiazide (MAXZIDE-25) 37.5-25 MG per tablet Take 0.5 tablets by mouth daily.      No current facility-administered medications on file prior to visit.     PAST MEDICAL HISTORY: Past Medical History:  Diagnosis Date  . Back pain   . Heartburn   . Hyperlipidemia   . Hypertension   . Insulin resistance   .  Obesity     PAST SURGICAL HISTORY: Past Surgical History:  Procedure Laterality Date  . BACK SURGERY     03/14/2004  . EYE SURGERY     bil cataract  02/2013  . LUMBAR LAMINECTOMY/DECOMPRESSION MICRODISCECTOMY N/A 05/19/2015   Procedure: Lumbar four- five diskectomy;  Surgeon: Coletta MemosKyle Cabbell, MD;  Location: [facility];  Service: Neurosurgery;  Laterality: N/A;  L45 diskectomy    SOCIAL HISTORY: Social History  Substance Use Topics  . Smoking status: Former Games developermoker  . Smokeless tobacco: Never Used     Comment: quit smokling in '70s  . Alcohol use 2.4 oz/week    4 Shots of liquor per week     Comment: social     FAMILY  HISTORY: Family History  Problem Relation Age of Onset  . Hypertension Father   . Hyperlipidemia Father   . Heart disease Father   . Obesity Father     ROS: Review of Systems  Constitutional: Positive for malaise/fatigue and weight loss.  Cardiovascular: Negative for chest pain and claudication.  Gastrointestinal: Negative for nausea and vomiting.  Musculoskeletal: Negative for myalgias.       Negative muscle weakness  Endo/Heme/Allergies:       Polyphagia Negative hypoglycemia    PHYSICAL EXAM: Blood pressure (!) 145/80, pulse (!) 53, temperature 98.2 F (36.8 C), temperature source Oral, height 5\' 5"  (1.651 m), weight 230 lb (104.3 kg), SpO2 96 %. Body mass index is 38.27 kg/m. Physical Exam  Constitutional: He is oriented to person, place, and time. He appears well-developed and well-nourished.  Cardiovascular: Normal rate.   Pulmonary/Chest: Effort normal.  Musculoskeletal: Normal range of motion.  Neurological: He is oriented to person, place, and time.  Skin: Skin is warm and dry.  Psychiatric: He has a normal mood and affect. His behavior is normal.  Vitals reviewed.   RECENT LABS AND TESTS: BMET    Component Value Date/Time   NA 141 04/01/2017 1010   K 4.4 04/01/2017 1010   CL 103 04/01/2017 1010   CO2 25 04/01/2017 1010   GLUCOSE 114 (H) 04/01/2017 1010   GLUCOSE 112 (H) 08/25/2015 1342   BUN 12 04/01/2017 1010   CREATININE 0.98 04/01/2017 1010   CALCIUM 9.7 04/01/2017 1010   GFRNONAA 79 04/01/2017 1010   GFRAA 91 04/01/2017 1010   Lab Results  Component Value Date   HGBA1C 6.4 (H) 04/01/2017   Lab Results  Component Value Date   INSULIN 14.6 04/01/2017   CBC    Component Value Date/Time   WBC 5.4 04/01/2017 1010   WBC 6.6 08/25/2015 1342   RBC 4.82 04/01/2017 1010   RBC 4.69 08/25/2015 1342   HGB 14.5 04/01/2017 1010   HCT 42.7 04/01/2017 1010   PLT 271 08/25/2015 1342   MCV 89 04/01/2017 1010   MCH 30.1 04/01/2017 1010   MCH 29.9  08/25/2015 1342   MCHC 34.0 04/01/2017 1010   MCHC 33.2 08/25/2015 1342   RDW 14.1 04/01/2017 1010   LYMPHSABS 2.4 04/01/2017 1010   EOSABS 0.1 04/01/2017 1010   BASOSABS 0.0 04/01/2017 1010   Iron/TIBC/Ferritin/ %Sat No results found for: IRON, TIBC, FERRITIN, IRONPCTSAT Lipid Panel     Component Value Date/Time   CHOL 233 (H) 04/01/2017 1010   TRIG 130 04/01/2017 1010   HDL 59 04/01/2017 1010   LDLCALC 148 (H) 04/01/2017 1010   Hepatic Function Panel     Component Value Date/Time   PROT 6.8 04/01/2017 1010   ALBUMIN 4.1  04/01/2017 1010   AST 13 04/01/2017 1010   ALT 14 04/01/2017 1010   ALKPHOS 63 04/01/2017 1010   BILITOT 0.2 04/01/2017 1010      Component Value Date/Time   TSH 1.350 04/01/2017 1010    ASSESSMENT AND PLAN: Hyperlipidemia, unspecified hyperlipidemia type  Vitamin D deficiency - Plan: Vitamin D, Ergocalciferol, (DRISDOL) 50000 units CAPS capsule  Prediabetes - Plan: metFORMIN (GLUCOPHAGE) 500 MG tablet  Class 2 obesity without serious comorbidity with body mass index (BMI) of 38.0 to 38.9 in adult, unspecified obesity type  PLAN:  Vitamin D Deficiency Tommy was informed that low vitamin D levels contributes to fatigue and are associated with obesity, breast, and colon cancer. He agrees to start to take prescription Vit D @50 ,000 IU every week #4 with no refills and will follow up for routine testing of vitamin D, at least 2-3 times per year. He was informed of the risk of over-replacement of vitamin D and agrees to not increase his dose unless he discusses this with [place] first. Nilson agrees to follow up with our clinic in 2 weeks.  Pre-Diabetes Eldin will continue to work on weight loss, exercise, and decreasing simple carbohydrates in his diet to help decrease the risk of diabetes. We dicussed metformin including benefits and risks. He was informed that eating too many simple carbohydrates or too many calories at one sitting increases the likelihood of  GI side effects. Aniceto requested metformin for now and a prescription was written today for metformin 500 mg every morning #30 with no refills. Lazer agreed to follow up with [place] as directed to monitor his progress.  Hyperlipidemia Addie was informed of the American Heart Association Guidelines emphasizing intensive lifestyle modifications as the first line treatment for hyperlipidemia. We discussed many lifestyle modifications today in depth, and Aquila will continue to work on decreasing saturated fats such as fatty red meat, butter and many fried foods. He will also increase vegetables and lean protein in his diet and continue to work on exercise and weight loss efforts. Chales is attempting to improve with diet and exercise. We will re-check labs in 3 months and follow.  Obesity Kiko is currently in the action stage of change. As such, his goal is to continue with weight loss efforts He has agreed to follow the Category 3 plan Aswad has been instructed to work up to a goal of 150 minutes of combined cardio and strengthening exercise per week for weight loss and overall health benefits. We discussed the following Behavioral Modification Strategies today: no skipping meals, increasing lean protein intake and decreasing simple carbohydrates   Charle has agreed to follow up with our clinic in 2 weeks. He was informed of the importance of frequent follow up visits to maximize his success with intensive lifestyle modifications for his multiple health conditions.  I, Nevada Crane, am acting as transcriptionist for Quillian Quince, MD  I have reviewed the above documentation for accuracy and completeness, and I agree with the above. -Quillian Quince, MD  OBESITY BEHAVIORAL INTERVENTION VISIT  Today's visit was # 2 out of 22.  Starting weight: 236 lbs Starting date: 04/01/17 Today's weight : 230 lbs Today's date: 04/15/2017 Total lbs lost to date: 6 (Patients must lose 7 lbs in the first 6 months to  continue with counseling)   ASK: We discussed the diagnosis of obesity with Rudy Jew Pollett today and Egbert agreed to give [place] permission to discuss obesity behavioral modification therapy today.  ASSESS: Saben has the  diagnosis of obesity and his BMI today is 38.4 Maron is in the action stage of change   ADVISE: Rana was educated on the multiple health risks of obesity as well as the benefit of weight loss to improve his health. He was advised of the need for long term treatment and the importance of lifestyle modifications.  AGREE: Multiple dietary modification options and treatment options were discussed and  Duquan agreed to follow the Category 3 plan We discussed the following Behavioral Modification Strategies today: no skipping meals, increasing lean protein intake and decreasing simple carbohydrates

## 2017-04-18 ENCOUNTER — Ambulatory Visit (INDEPENDENT_AMBULATORY_CARE_PROVIDER_SITE_OTHER): Payer: Medicare Other

## 2017-04-18 DIAGNOSIS — J309 Allergic rhinitis, unspecified: Secondary | ICD-10-CM | POA: Diagnosis not present

## 2017-04-25 ENCOUNTER — Ambulatory Visit (INDEPENDENT_AMBULATORY_CARE_PROVIDER_SITE_OTHER): Payer: Medicare Other

## 2017-04-25 DIAGNOSIS — J309 Allergic rhinitis, unspecified: Secondary | ICD-10-CM | POA: Diagnosis not present

## 2017-04-29 ENCOUNTER — Ambulatory Visit (INDEPENDENT_AMBULATORY_CARE_PROVIDER_SITE_OTHER): Payer: Medicare Other | Admitting: Physician Assistant

## 2017-04-29 VITALS — BP 137/82 | HR 48 | Temp 97.9°F | Ht 65.0 in | Wt 227.0 lb

## 2017-04-29 DIAGNOSIS — R7303 Prediabetes: Secondary | ICD-10-CM

## 2017-04-29 DIAGNOSIS — Z9189 Other specified personal risk factors, not elsewhere classified: Secondary | ICD-10-CM | POA: Diagnosis not present

## 2017-04-29 DIAGNOSIS — Z6837 Body mass index (BMI) 37.0-37.9, adult: Secondary | ICD-10-CM | POA: Diagnosis not present

## 2017-04-29 DIAGNOSIS — E559 Vitamin D deficiency, unspecified: Secondary | ICD-10-CM | POA: Diagnosis not present

## 2017-04-29 DIAGNOSIS — E669 Obesity, unspecified: Secondary | ICD-10-CM

## 2017-04-29 DIAGNOSIS — IMO0001 Reserved for inherently not codable concepts without codable children: Secondary | ICD-10-CM

## 2017-04-29 NOTE — Progress Notes (Signed)
Office: [phone]  /  Fax: [phone]   HPI:   Chief Complaint: OBESITY Tyler [name] is here to discuss his progress with his obesity treatment plan. He is on the  follow the Category 3 plan and is following his eating plan approximately 100 % of the time. He states he is exercising 0 minutes 0 times per week. [name] continues to do well with weight loss. He is planning ahead well and hunger is well controlled. His weight is 227 lb (103 kg) today and has had a weight loss of 3 pounds over a period of 2 weeks since his last visit. He has lost 9 lbs since starting treatment with [place].  Vitamin D deficiency Tyler [name] has a diagnosis of vitamin D deficiency. He is currently taking vit D and denies nausea, vomiting or muscle weakness.  Pre-Diabetes Tyler [name] has a diagnosis of pre-diabetes based on his elevated Hgb A1c and was informed this puts him at greater risk of developing diabetes. He is taking metformin currently and continues to work on diet and exercise to decrease risk of diabetes. He admits polyphagia in the past and denies nausea or hypoglycemia.  Diabetes risk counselling Tyler [name] was given extended (at least 15 minutes) diabetes prevention counseling today. He is 71 y.o. male and has risk factors for diabetes including obesity and pre-diabetes. We discussed intensive lifestyle modifications today with an emphasis on weight loss as well as increasing exercise and decreasing simple carbohydrates in his diet.   ALLERGIES: No Known Allergies  MEDICATIONS: Current Outpatient Prescriptions on File Prior to Visit  Medication Sig Dispense Refill  . acetaminophen (TYLENOL) 500 MG tablet Take 500 mg by mouth every 6 (six) hours as needed for mild pain.    Marland Kitchen amLODipine (NORVASC) 5 MG tablet Take 5 mg by mouth daily.    . Cholecalciferol (VITAMIN D3) 2000 UNITS TABS Take 2,000 Units by mouth daily.    . CHROMIUM PO Take 1 tablet by mouth every morning.    Marland Kitchen CRESTOR 5 MG tablet Take 5 mg by mouth at  bedtime.  1  . dextromethorphan-guaiFENesin (MUCINEX DM) 30-600 MG 12hr tablet Take 1 tablet by mouth 2 (two) times daily as needed for cough.    . docusate sodium (COLACE) 100 MG capsule Take 200 mg by mouth at bedtime as needed for mild constipation.    Marland Kitchen HYDROcodone-acetaminophen (NORCO/VICODIN) 5-325 MG tablet Take 1-2 tablets by mouth every 6 (six) hours as needed for moderate pain. 80 tablet 0  . metFORMIN (GLUCOPHAGE) 500 MG tablet Take 1 tablet (500 mg total) by mouth every morning. 30 tablet 0  . methocarbamol (ROBAXIN) 500 MG tablet Take 1 tablet (500 mg total) by mouth 2 (two) times daily as needed for muscle spasms. 60 tablet 1  . multivitamin-iron-minerals-folic acid (CENTRUM) chewable tablet Chew 1 tablet by mouth daily.    . Potassium 99 MG TABS Take 99 mg by mouth daily.    . QNASL 80 MCG/ACT AERS ONE SPRAY EACH NOSTRIL TWICE DAILY FOR STUFFY NOSE OR DRAINAGE. 8.7 Inhaler 0  . triamterene-hydrochlorothiazide (MAXZIDE-25) 37.5-25 MG per tablet Take 0.5 tablets by mouth daily.     . Vitamin D, Ergocalciferol, (DRISDOL) 50000 units CAPS capsule Take 1 capsule (50,000 Units total) by mouth every 7 (seven) days. 4 capsule 0   No current facility-administered medications on file prior to visit.     PAST MEDICAL HISTORY: Past Medical History:  Diagnosis Date  . Back pain   . Heartburn   . Hyperlipidemia   .  Hypertension   . Insulin resistance   . Obesity     PAST SURGICAL HISTORY: Past Surgical History:  Procedure Laterality Date  . BACK SURGERY     03/14/2004  . EYE SURGERY     bil cataract  02/2013  . LUMBAR LAMINECTOMY/DECOMPRESSION MICRODISCECTOMY N/A 05/19/2015   Procedure: Lumbar four- five diskectomy;  Surgeon: Coletta Memos, MD;  Location: [facility];  Service: Neurosurgery;  Laterality: N/A;  L45 diskectomy    SOCIAL HISTORY: Social History  Substance Use Topics  . Smoking status: Former Games developer  . Smokeless tobacco: Never Used     Comment: quit smokling in  '70s  . Alcohol use 2.4 oz/week    4 Shots of liquor per week     Comment: social     FAMILY HISTORY: Family History  Problem Relation Age of Onset  . Hypertension Father   . Hyperlipidemia Father   . Heart disease Father   . Obesity Father     ROS: Review of Systems  Constitutional: Positive for weight loss.  Gastrointestinal: Negative for nausea and vomiting.  Musculoskeletal:       Negative muscle weakness  Endo/Heme/Allergies:       Polyphagia Negative hypoglycemia    PHYSICAL EXAM: Blood pressure 137/82, pulse (!) 48, temperature 97.9 F (36.6 C), temperature source Oral, height 5\' 5"  (1.651 m), weight 227 lb (103 kg), SpO2 98 %. Body mass index is 37.77 kg/m. Physical Exam  Constitutional: He is oriented to person, place, and time. He appears well-developed and well-nourished.  Cardiovascular: Normal rate.   Pulmonary/Chest: Effort normal.  Musculoskeletal: Normal range of motion.  Neurological: He is oriented to person, place, and time.  Skin: Skin is warm and dry.  Psychiatric: He has a normal mood and affect. His behavior is normal.  Vitals reviewed.   RECENT LABS AND TESTS: BMET    Component Value Date/Time   NA 141 04/01/2017 1010   K 4.4 04/01/2017 1010   CL 103 04/01/2017 1010   CO2 25 04/01/2017 1010   GLUCOSE 114 (H) 04/01/2017 1010   GLUCOSE 112 (H) 08/25/2015 1342   BUN 12 04/01/2017 1010   CREATININE 0.98 04/01/2017 1010   CALCIUM 9.7 04/01/2017 1010   GFRNONAA 79 04/01/2017 1010   GFRAA 91 04/01/2017 1010   Lab Results  Component Value Date   HGBA1C 6.4 (H) 04/01/2017   Lab Results  Component Value Date   INSULIN 14.6 04/01/2017   CBC    Component Value Date/Time   WBC 5.4 04/01/2017 1010   WBC 6.6 08/25/2015 1342   RBC 4.82 04/01/2017 1010   RBC 4.69 08/25/2015 1342   HGB 14.5 04/01/2017 1010   HCT 42.7 04/01/2017 1010   PLT 271 08/25/2015 1342   MCV 89 04/01/2017 1010   MCH 30.1 04/01/2017 1010   MCH 29.9 08/25/2015  1342   MCHC 34.0 04/01/2017 1010   MCHC 33.2 08/25/2015 1342   RDW 14.1 04/01/2017 1010   LYMPHSABS 2.4 04/01/2017 1010   EOSABS 0.1 04/01/2017 1010   BASOSABS 0.0 04/01/2017 1010   Iron/TIBC/Ferritin/ %Sat No results found for: IRON, TIBC, FERRITIN, IRONPCTSAT Lipid Panel     Component Value Date/Time   CHOL 233 (H) 04/01/2017 1010   TRIG 130 04/01/2017 1010   HDL 59 04/01/2017 1010   LDLCALC 148 (H) 04/01/2017 1010   Hepatic Function Panel     Component Value Date/Time   PROT 6.8 04/01/2017 1010   ALBUMIN 4.1 04/01/2017 1010   AST  13 04/01/2017 1010   ALT 14 04/01/2017 1010   ALKPHOS 63 04/01/2017 1010   BILITOT 0.2 04/01/2017 1010      Component Value Date/Time   TSH 1.350 04/01/2017 1010    ASSESSMENT AND PLAN: Prediabetes  Vitamin D deficiency  At risk for diabetes mellitus  Class 2 obesity with serious comorbidity and body mass index (BMI) of 37.0 to 37.9 in adult, unspecified obesity type  PLAN:  Vitamin D Deficiency Tyler [name] was informed that low vitamin D levels contributes to fatigue and are associated with obesity, breast, and colon cancer. He agrees to continue to take prescription Vit D @50 ,000 IU every week and will follow up for routine testing of vitamin D, at least 2-3 times per year. He was informed of the risk of over-replacement of vitamin D and agrees to not increase his dose unless he discusses this with us first.  Pre-Diabetes Tyler [name] will continue to work on weight loss, exercise, and decreasing simple carbohydrates in his diet to help decrease the risk of diabetes. We dicussed metformin including benefits and risks. He was informed that eating too many simple carbohydrates or too many calories at one sitting increases the likelihood of GI side effects. Tyler [name] agrees to continue metformin for now and a prescription was not written today. Tyler [name] agreed to follow up with us as directed to monitor his progress.  Diabetes risk counselling Tyler [name] was  given extended (at least 15 minutes) diabetes prevention counseling today. He is 71 y.o. male and has risk factors for diabetes including obesity and pre-diabetes. We discussed intensive lifestyle modifications today with an emphasis on weight loss as well as increasing exercise and decreasing simple carbohydrates in his diet.  Obesity Tyler [name] is currently in the action stage of change. As such, his goal is to continue with weight loss efforts He has agreed to follow the Category 3 plan Tyler [name] has been instructed to work up to a goal of 150 minutes of combined cardio and strengthening exercise per week for weight loss and overall health benefits. We discussed the following Behavioral Modification Strategies today: increasing lean protein intake and meal planning & cooking strategies  Tyler [name] has agreed to follow up with our clinic in 2 weeks. He was informed of the importance of frequent follow up visits to maximize his success with intensive lifestyle modifications for his multiple health conditions.  Cristi LoronI, Joanne Murray, am acting as transcriptionist for Express ScriptsSahar Oaman PA-C  I have reviewed the above documentation for accuracy and completeness, and I agree with the above. -Quillian Quincearen Heydi Swango, MD   OBESITY BEHAVIORAL INTERVENTION VISIT  Today's visit was # 3 out of 22.  Starting weight: 236 lbs Starting date: 04/01/17 Today's weight : 227 lbs  Today's date: 04/29/2017 Total lbs lost to date: 9 (Patients must lose 7 lbs in the first 6 months to continue with counseling)   ASK: We discussed the diagnosis of obesity with Rudy JewJames A Gryder today and Tyler [name] agreed to give us permission to discuss obesity behavioral modification therapy today.  ASSESS: Tyler [name] has the diagnosis of obesity and his BMI today is 37.9 Tyler [name] is in the action stage of change   ADVISE: Tyler [name] was educated on the multiple health risks of obesity as well as the benefit of weight loss to improve his health. He was advised of the  need for long term treatment and the importance of lifestyle modifications.  AGREE: Multiple dietary modification options and treatment options were discussed and  Tyler [name] agreed to follow the Category  3 plan We discussed the following Behavioral Modification Strategies today: increasing lean protein intake and meal planning & cooking strategies

## 2017-05-02 ENCOUNTER — Ambulatory Visit (INDEPENDENT_AMBULATORY_CARE_PROVIDER_SITE_OTHER): Payer: Medicare Other

## 2017-05-02 DIAGNOSIS — J309 Allergic rhinitis, unspecified: Secondary | ICD-10-CM | POA: Diagnosis not present

## 2017-05-06 DIAGNOSIS — J3089 Other allergic rhinitis: Secondary | ICD-10-CM | POA: Diagnosis not present

## 2017-05-06 NOTE — Progress Notes (Signed)
VIALS EXP- 05/09/18 

## 2017-05-07 DIAGNOSIS — J3081 Allergic rhinitis due to animal (cat) (dog) hair and dander: Secondary | ICD-10-CM

## 2017-05-08 DIAGNOSIS — J301 Allergic rhinitis due to pollen: Secondary | ICD-10-CM | POA: Diagnosis not present

## 2017-05-09 ENCOUNTER — Ambulatory Visit (INDEPENDENT_AMBULATORY_CARE_PROVIDER_SITE_OTHER): Payer: Medicare Other

## 2017-05-09 DIAGNOSIS — J309 Allergic rhinitis, unspecified: Secondary | ICD-10-CM

## 2017-05-13 ENCOUNTER — Ambulatory Visit (INDEPENDENT_AMBULATORY_CARE_PROVIDER_SITE_OTHER): Payer: Medicare Other | Admitting: Physician Assistant

## 2017-05-13 VITALS — BP 125/69 | HR 71 | Temp 98.0°F | Ht 65.0 in | Wt 230.0 lb

## 2017-05-13 DIAGNOSIS — Z6838 Body mass index (BMI) 38.0-38.9, adult: Secondary | ICD-10-CM | POA: Diagnosis not present

## 2017-05-13 DIAGNOSIS — E669 Obesity, unspecified: Secondary | ICD-10-CM

## 2017-05-13 DIAGNOSIS — IMO0001 Reserved for inherently not codable concepts without codable children: Secondary | ICD-10-CM

## 2017-05-13 DIAGNOSIS — R7303 Prediabetes: Secondary | ICD-10-CM | POA: Diagnosis not present

## 2017-05-13 DIAGNOSIS — E559 Vitamin D deficiency, unspecified: Secondary | ICD-10-CM

## 2017-05-13 DIAGNOSIS — Z9189 Other specified personal risk factors, not elsewhere classified: Secondary | ICD-10-CM

## 2017-05-13 MED ORDER — METFORMIN HCL 500 MG PO TABS: 500.0000 mg | ORAL_TABLET | Freq: Every morning | ORAL | 0 refills | Status: DC

## 2017-05-13 MED ORDER — VITAMIN D (ERGOCALCIFEROL) 1.25 MG (50000 UNIT) PO CAPS: 50000.0000 [IU] | ORAL_CAPSULE | ORAL | 0 refills | Status: DC

## 2017-05-14 NOTE — Progress Notes (Signed)
Office: [phone]  /  Fax: [phone]   HPI:   Chief Complaint: OBESITY Tyler [name] is here to discuss his progress with his obesity treatment plan. He is on the  follow the Category 3 plan and is following his eating plan approximately 100 % of the time. He states he is exercising 0 minutes 0 times per week. Henery with his weight since last visit. States he has been replacing some of his meals with protein shakes.  States hunger is well controlled.  His weight is 230 lb (104.3 kg) today and has gained 6 lbs since his last visit. He has lost 6 lbs since starting treatment with us.  Vitamin D deficiency Tyler [name] has a diagnosis of vitamin D deficiency. He is currently taking vit D and denies nausea, vomiting or muscle weakness.  Pre-Diabetes Tyler [name] has a diagnosis of prediabetes based on his elevated HgA1c and was informed this puts him at greater risk of developing diabetes. He is taking metformin currently and continues to work on diet and exercise to decrease risk of diabetes. He denies nausea or hypoglycemia.  At risk for diabetes Tyler [name] is at higher than averagerisk for developing diabetes due to his obesity. He currently denies polyuria or polydipsia.    ALLERGIES: No Known Allergies  MEDICATIONS: Current Outpatient Prescriptions on File Prior to Visit  Medication Sig Dispense Refill  . acetaminophen (TYLENOL) 500 MG tablet Take 500 mg by mouth every 6 (six) hours as needed for mild pain.    Marland Kitchen. amLODipine (NORVASC) 5 MG tablet Take 5 mg by mouth daily.    . Cholecalciferol (VITAMIN D3) 2000 UNITS TABS Take 2,000 Units by mouth daily.    . CHROMIUM PO Take 1 tablet by mouth every morning.    Marland Kitchen. CRESTOR 5 MG tablet Take 5 mg by mouth at bedtime.  1  . dextromethorphan-guaiFENesin (MUCINEX DM) 30-600 MG 12hr tablet Take 1 tablet by mouth 2 (two) times daily as needed for cough.    . docusate sodium (COLACE) 100 MG capsule Take 200 mg by mouth at bedtime as needed for mild constipation.     Marland Kitchen. HYDROcodone-acetaminophen (NORCO/VICODIN) 5-325 MG tablet Take 1-2 tablets by mouth every 6 (six) hours as needed for moderate pain. 80 tablet 0  . methocarbamol (ROBAXIN) 500 MG tablet Take 1 tablet (500 mg total) by mouth 2 (two) times daily as needed for muscle spasms. 60 tablet 1  . multivitamin-iron-minerals-folic acid (CENTRUM) chewable tablet Chew 1 tablet by mouth daily.    . Potassium 99 MG TABS Take 99 mg by mouth daily.    . QNASL 80 MCG/ACT AERS ONE SPRAY EACH NOSTRIL TWICE DAILY FOR STUFFY NOSE OR DRAINAGE. 8.7 Inhaler 0  . triamterene-hydrochlorothiazide (MAXZIDE-25) 37.5-25 MG per tablet Take 0.5 tablets by mouth daily.      No current facility-administered medications on file prior to visit.     PAST MEDICAL HISTORY: Past Medical History:  Diagnosis Date  . Back pain   . Heartburn   . Hyperlipidemia   . Hypertension   . Insulin resistance   . Obesity     PAST SURGICAL HISTORY: Past Surgical History:  Procedure Laterality Date  . BACK SURGERY     03/14/2004  . EYE SURGERY     bil cataract  02/2013  . LUMBAR LAMINECTOMY/DECOMPRESSION MICRODISCECTOMY N/A 05/19/2015   Procedure: Lumbar four- five diskectomy;  Surgeon: Coletta MemosKyle Cabbell, MD;  Location: [facility];  Service: Neurosurgery;  Laterality: N/A;  L45 diskectomy    SOCIAL  HISTORY: Social History  Substance Use Topics  . Smoking status: Former Games developermoker  . Smokeless tobacco: Never Used     Comment: quit smokling in '70s  . Alcohol use 2.4 oz/week    4 Shots of liquor per week     Comment: social     FAMILY HISTORY: Family History  Problem Relation Age of Onset  . Hypertension Father   . Hyperlipidemia Father   . Heart disease Father   . Obesity Father     ROS: Review of Systems  Gastrointestinal: Negative for nausea and vomiting.  Musculoskeletal:       Negative muscle weakness  Endo/Heme/Allergies:       Negative polyphagia    PHYSICAL EXAM: Blood pressure 125/69, pulse 71, temperature 98  F (36.7 C), height 5\' 5"  (1.651 m), weight 230 lb (104.3 kg), SpO2 99 %. Body mass index is 38.27 kg/m. Physical Exam  Constitutional: He is oriented to person, place, and time. He appears well-developed and well-nourished.  Cardiovascular: Normal rate.   Pulmonary/Chest: Effort normal.  Musculoskeletal: Normal range of motion.  Neurological: He is alert and oriented to person, place, and time.  Skin: Skin is warm and dry.    RECENT LABS AND TESTS: BMET    Component Value Date/Time   NA 141 04/01/2017 1010   K 4.4 04/01/2017 1010   CL 103 04/01/2017 1010   CO2 25 04/01/2017 1010   GLUCOSE 114 (H) 04/01/2017 1010   GLUCOSE 112 (H) 08/25/2015 1342   BUN 12 04/01/2017 1010   CREATININE 0.98 04/01/2017 1010   CALCIUM 9.7 04/01/2017 1010   GFRNONAA 79 04/01/2017 1010   GFRAA 91 04/01/2017 1010   Lab Results  Component Value Date   HGBA1C 6.4 (H) 04/01/2017   Lab Results  Component Value Date   INSULIN 14.6 04/01/2017   CBC    Component Value Date/Time   WBC 5.4 04/01/2017 1010   WBC 6.6 08/25/2015 1342   RBC 4.82 04/01/2017 1010   RBC 4.69 08/25/2015 1342   HGB 14.5 04/01/2017 1010   HCT 42.7 04/01/2017 1010   PLT 271 08/25/2015 1342   MCV 89 04/01/2017 1010   MCH 30.1 04/01/2017 1010   MCH 29.9 08/25/2015 1342   MCHC 34.0 04/01/2017 1010   MCHC 33.2 08/25/2015 1342   RDW 14.1 04/01/2017 1010   LYMPHSABS 2.4 04/01/2017 1010   EOSABS 0.1 04/01/2017 1010   BASOSABS 0.0 04/01/2017 1010   Iron/TIBC/Ferritin/ %Sat No results found for: IRON, TIBC, FERRITIN, IRONPCTSAT Lipid Panel     Component Value Date/Time   CHOL 233 (H) 04/01/2017 1010   TRIG 130 04/01/2017 1010   HDL 59 04/01/2017 1010   LDLCALC 148 (H) 04/01/2017 1010   Hepatic Function Panel     Component Value Date/Time   PROT 6.8 04/01/2017 1010   ALBUMIN 4.1 04/01/2017 1010   AST 13 04/01/2017 1010   ALT 14 04/01/2017 1010   ALKPHOS 63 04/01/2017 1010   BILITOT 0.2 04/01/2017 1010        Component Value Date/Time   TSH 1.350 04/01/2017 1010    ASSESSMENT AND PLAN: Vitamin D deficiency - Plan: Vitamin D, Ergocalciferol, (DRISDOL) 50000 units CAPS capsule  Prediabetes - Plan: metFORMIN (GLUCOPHAGE) 500 MG tablet  At risk for diabetes mellitus  Class 2 obesity with serious comorbidity and body mass index (BMI) of 38.0 to 38.9 in adult, unspecified obesity type  PLAN:  Vitamin D Deficiency Tyler [name] was informed that low vitamin D levels contributes  to fatigue and are associated with obesity, breast, and colon cancer. He agrees to continue to take prescription Vit D @50 ,000 IU every week, refill written today, and will follow up for routine testing of vitamin D, at least 2-3 times per year. He was informed of the risk of over-replacement of vitamin D and agrees to not increase his dose unless he discusses this with [place] first.  Pre-Diabetes Zygmund will continue to work on weight loss, exercise, and decreasing simple carbohydrates in his diet to help decrease the risk of diabetes. We dicussed metformin including benefits and risks. He was informed that eating too many simple carbohydrates or too many calories at one sitting increases the likelihood of GI side effects. Tyler Fearing requested metformin for now and a prescription was written today. Eldra agreed to follow up with [place] as directed to monitor his progress.  Diabetes risk counselling Dontavious was given extended (15 minutes) diabetes prevention counseling today. He is 71 y.o. male and has risk factors for diabetes including obesity. We discussed intensive lifestyle modifications today with an emphasis on weight loss as well as increasing exercise and decreasing simple carbohydrates in his diet.  Obesity Yehia is currently in the action stage of change. As such, his goal is to continue with weight loss efforts He has agreed to follow the Category 3 plan Linsey has been instructed to work up to a goal of 150 minutes of combined cardio  and strengthening exercise per week for weight loss and overall health benefits. We discussed the following Behavioral Modification Stratagies today: increasing lean protein intake and avoid skipping meals   Eliyas has agreed to follow up with our clinic in 2 weeks. He was informed of the importance of frequent follow up visits to maximize his success with intensive lifestyle modifications for his multiple health conditions.   Office: [phone]  /  Fax: [phone]  OBESITY BEHAVIORAL INTERVENTION VISIT  Today's visit was # 4 out of 22.  Starting weight: 236 Starting date: 04/01/2017 Today's weight : Weight: 230 lb (104.3 kg)  Today's date: 05/14/2017 Total lbs lost to date: 6 (Patients must lose 7 lbs in the first 6 months to continue with counseling)   ASK: We discussed the diagnosis of obesity with Rudy Jew Hillier today and Welborn agreed to give [place] permission to discuss obesity behavioral modification therapy today.  ASSESS: Saben has the diagnosis of obesity and his BMI today is @TBMI @ Arjan is in the action stage of change   ADVISE: Decker was educated on the multiple health risks of obesity as well as the benefit of weight loss to improve his health. He was advised of the need for long term treatment and the importance of lifestyle modifications.  AGREE: Multiple dietary modification options and treatment options were discussed and  Lorris agreed to follow the Category 3 plan We discussed the following Behavioral Modification Stratagies today: increasing lean protein intake and avoid skipping meals.     I have reviewed the above documentation for accuracy and completeness, and I agree with the above. -Illa Level, PA-C  I have reviewed the above note and agree with the plan. -Quillian Quince, MD

## 2017-05-22 ENCOUNTER — Ambulatory Visit (INDEPENDENT_AMBULATORY_CARE_PROVIDER_SITE_OTHER): Payer: Medicare Other | Admitting: *Deleted

## 2017-05-22 DIAGNOSIS — J309 Allergic rhinitis, unspecified: Secondary | ICD-10-CM | POA: Diagnosis not present

## 2017-05-27 ENCOUNTER — Ambulatory Visit (INDEPENDENT_AMBULATORY_CARE_PROVIDER_SITE_OTHER): Payer: Medicare Other | Admitting: Physician Assistant

## 2017-05-27 VITALS — BP 137/78 | HR 55 | Temp 97.8°F | Ht 65.0 in | Wt 231.0 lb

## 2017-05-27 DIAGNOSIS — Z6838 Body mass index (BMI) 38.0-38.9, adult: Secondary | ICD-10-CM

## 2017-05-27 DIAGNOSIS — E669 Obesity, unspecified: Secondary | ICD-10-CM | POA: Diagnosis not present

## 2017-05-27 DIAGNOSIS — IMO0001 Reserved for inherently not codable concepts without codable children: Secondary | ICD-10-CM

## 2017-05-27 DIAGNOSIS — I10 Essential (primary) hypertension: Secondary | ICD-10-CM | POA: Insufficient documentation

## 2017-05-27 NOTE — Progress Notes (Signed)
Office: [phone]  /  Fax: [phone]   HPI:   Chief Complaint: OBESITY Tyler [name] is here to discuss his progress with his obesity treatment plan. He is on the  follow the Category 3 plan and is following his eating plan approximately 100 % of the time. He states he is walking for 30 minutes 2 to 3 times per week. Tyler [name] has been planning ahead well and is following the plan. His hunger is well controlled. His weight is 231 lb (104.8 kg) today and has had a weight gain of 1 pound over a period of 2 weeks since his last visit. He has lost 5 lbs since starting treatment with us.  Hypertension Tyler [name] is a 71 y.o. male with hypertension. His blood pressure is stable and Tyler [name] denies chest pain or shortness of breath on exertion. He is working weight loss to help control his blood pressure with the goal of decreasing his risk of heart attack and stroke. Jamess blood pressure is currently controlled.   ALLERGIES: No Known Allergies  MEDICATIONS: Current Outpatient Prescriptions on File Prior to Visit  Medication Sig Dispense Refill   acetaminophen (TYLENOL) 500 MG tablet Take 500 mg by mouth every 6 (six) hours as needed for mild pain.     amLODipine (NORVASC) 5 MG tablet Take 5 mg by mouth daily.     Cholecalciferol (VITAMIN D3) 2000 UNITS TABS Take 2,000 Units by mouth daily.     CHROMIUM PO Take 1 tablet by mouth every morning.     CRESTOR 5 MG tablet Take 5 mg by mouth at bedtime.  1   dextromethorphan-guaiFENesin (MUCINEX DM) 30-600 MG 12hr tablet Take 1 tablet by mouth 2 (two) times daily as needed for cough.     docusate sodium (COLACE) 100 MG capsule Take 200 mg by mouth at bedtime as needed for mild constipation.     HYDROcodone-acetaminophen (NORCO/VICODIN) 5-325 MG tablet Take 1-2 tablets by mouth every 6 (six) hours as needed for moderate pain. 80 tablet 0   metFORMIN (GLUCOPHAGE) 500 MG tablet Take 1 tablet (500 mg total) by mouth every  morning. 30 tablet 0   methocarbamol (ROBAXIN) 500 MG tablet Take 1 tablet (500 mg total) by mouth 2 (two) times daily as needed for muscle spasms. 60 tablet 1   multivitamin-iron-minerals-folic acid (CENTRUM) chewable tablet Chew 1 tablet by mouth daily.     Potassium 99 MG TABS Take 99 mg by mouth daily.     QNASL 80 MCG/ACT AERS ONE SPRAY EACH NOSTRIL TWICE DAILY FOR STUFFY NOSE OR DRAINAGE. 8.7 Inhaler 0   triamterene-hydrochlorothiazide (MAXZIDE-25) 37.5-25 MG per tablet Take 0.5 tablets by mouth daily.      Vitamin D, Ergocalciferol, (DRISDOL) 50000 units CAPS capsule Take 1 capsule (50,000 Units total) by mouth every 7 (seven) days. 4 capsule 0   No current facility-administered medications on file prior to visit.     PAST MEDICAL HISTORY: Past Medical History:  Diagnosis Date   Back pain    Heartburn    Hyperlipidemia    Hypertension    Insulin resistance    Obesity     PAST SURGICAL HISTORY: Past Surgical History:  Procedure Laterality Date   BACK SURGERY     03/14/2004   EYE SURGERY     bil cataract  02/2013   LUMBAR LAMINECTOMY/DECOMPRESSION MICRODISCECTOMY N/A 05/19/2015   Procedure: Lumbar four- five diskectomy;  Surgeon: Coletta MemosKyle Cabbell, MD;  Location: [facility];  Service: Neurosurgery;  Laterality: N/A;  L45 diskectomy    SOCIAL HISTORY: Social History  Substance Use Topics   Smoking status: Former Smoker   Smokeless tobacco: Never Used     Comment: quit smokling in '70s   Alcohol use 2.4 oz/week    4 Shots of liquor per week     Comment: social     FAMILY HISTORY: Family History  Problem Relation Age of Onset   Hypertension Father    Hyperlipidemia Father    Heart disease Father    Obesity Father     ROS: Review of Systems  Constitutional: Negative for weight loss.  Respiratory: Negative for shortness of breath (on exertion).   Cardiovascular: Negative for chest pain.    PHYSICAL EXAM: Blood pressure 137/78, pulse (!) 55,  temperature 97.8 F (36.6 C), temperature source Oral, height 5\' 5"  (1.651 m), weight 231 lb (104.8 kg), SpO2 98 %. Body mass index is 38.44 kg/m. Physical Exam  Constitutional: He is oriented to person, place, and time. He appears well-developed and well-nourished.  Cardiovascular: Normal rate.   Pulmonary/Chest: Effort normal.  Musculoskeletal: Normal range of motion.  Neurological: He is oriented to person, place, and time.  Skin: Skin is warm and dry.  Psychiatric: He has a normal mood and affect. His behavior is normal.  Vitals reviewed.   RECENT LABS AND TESTS: BMET    Component Value Date/Time   NA 141 04/01/2017 1010   K 4.4 04/01/2017 1010   CL 103 04/01/2017 1010   CO2 25 04/01/2017 1010   GLUCOSE 114 (H) 04/01/2017 1010   GLUCOSE 112 (H) 08/25/2015 1342   BUN 12 04/01/2017 1010   CREATININE 0.98 04/01/2017 1010   CALCIUM 9.7 04/01/2017 1010   GFRNONAA 79 04/01/2017 1010   GFRAA 91 04/01/2017 1010   Lab Results  Component Value Date   HGBA1C 6.4 (H) 04/01/2017   Lab Results  Component Value Date   INSULIN 14.6 04/01/2017   CBC    Component Value Date/Time   WBC 5.4 04/01/2017 1010   WBC 6.6 08/25/2015 1342   RBC 4.82 04/01/2017 1010   RBC 4.69 08/25/2015 1342   HGB 14.5 04/01/2017 1010   HCT 42.7 04/01/2017 1010   PLT 271 08/25/2015 1342   MCV 89 04/01/2017 1010   MCH 30.1 04/01/2017 1010   MCH 29.9 08/25/2015 1342   MCHC 34.0 04/01/2017 1010   MCHC 33.2 08/25/2015 1342   RDW 14.1 04/01/2017 1010   LYMPHSABS 2.4 04/01/2017 1010   EOSABS 0.1 04/01/2017 1010   BASOSABS 0.0 04/01/2017 1010   Iron/TIBC/Ferritin/ %Sat No results found for: IRON, TIBC, FERRITIN, IRONPCTSAT Lipid Panel     Component Value Date/Time   CHOL 233 (H) 04/01/2017 1010   TRIG 130 04/01/2017 1010   HDL 59 04/01/2017 1010   LDLCALC 148 (H) 04/01/2017 1010   Hepatic Function Panel     Component Value Date/Time   PROT 6.8 04/01/2017 1010   ALBUMIN 4.1 04/01/2017 1010     AST 13 04/01/2017 1010   ALT 14 04/01/2017 1010   ALKPHOS 63 04/01/2017 1010   BILITOT 0.2 04/01/2017 1010      Component Value Date/Time   TSH 1.350 04/01/2017 1010    ASSESSMENT AND PLAN: Essential hypertension  Class 2 obesity with serious comorbidity and body mass index (BMI) of 38.0 to 38.9 in adult, unspecified obesity type  PLAN:  Hypertension We discussed sodium restriction, working on healthy weight loss, and a regular exercise program as the means  to achieve improved blood pressure control. Tyler Fearing agreed with this plan and agreed to follow up as directed. We will continue to monitor his blood pressure as well as his progress with the above lifestyle modifications. He will continue his medications as prescribed and will watch for signs of hypotension as he continues his lifestyle modifications.   We spent > than 50% of the 15 minute visit on the counseling as documented in the note.  Obesity Tyler [name] is currently in the action stage of change. As such, his goal is to continue with weight loss efforts He has agreed to follow the Category 2 plan Tyler [name] has been instructed to work up to a goal of 150 minutes of combined cardio and strengthening exercise per week for weight loss and overall health benefits. We discussed the following Behavioral Modification Strategies today: increasing lean protein intake and meal planning & cooking strategies  Tyler [name] has agreed to follow up with our clinic in 2 weeks. He was informed of the importance of frequent follow up visits to maximize his success with intensive lifestyle modifications for his multiple health conditions.  I, [place] Crane, am acting as transcriptionist for Illa Level, PA-C  I have reviewed the above documentation for accuracy and completeness, and I agree with the above. -Illa Level, PA-C  I have reviewed the above note and agree with the plan. -Tyler Quince, MD   OBESITY BEHAVIORAL INTERVENTION VISIT  Today's  visit was # 5 out of 22.  Starting weight: 236 lbs Starting date: 04/01/17 Today's weight : 231 lbs Today's date: 05/27/2017 Total lbs lost to date: 5 (Patients must lose 7 lbs in the first 6 months to continue with counseling)   ASK: We discussed the diagnosis of obesity with Tyler Jew Hege today and Clarkson agreed to give [place] permission to discuss obesity behavioral modification therapy today.  ASSESS: Chayce has the diagnosis of obesity and his BMI today is 50.5 Archit is in the action stage of change   ADVISE: Jayro was educated on the multiple health risks of obesity as well as the benefit of weight loss to improve his health. He was advised of the need for long term treatment and the importance of lifestyle modifications.  AGREE: Multiple dietary modification options and treatment options were discussed and  Jevonte agreed to follow the Category 2 plan We discussed the following Behavioral Modification Strategies today: increasing lean protein intake and meal planning & cooking strategies

## 2017-05-29 ENCOUNTER — Ambulatory Visit (INDEPENDENT_AMBULATORY_CARE_PROVIDER_SITE_OTHER): Payer: Medicare Other | Admitting: *Deleted

## 2017-05-29 DIAGNOSIS — J309 Allergic rhinitis, unspecified: Secondary | ICD-10-CM | POA: Diagnosis not present

## 2017-05-30 IMAGING — MR MR LUMBAR SPINE WO/W CM
5 of 8 series · 26 of 48 positions shown · IV contrast (multihance)
Comparison: 04/07/2015 radiography

CLINICAL DATA: Low back pain and soreness after fall 6 weeks ago.
History of back surgery in 9006.

EXAM:
MRI LUMBAR SPINE WITHOUT AND WITH CONTRAST
TECHNIQUE: Multiplanar and multiecho pulse sequences of the lumbar spine were
obtained without and with intravenous contrast.
CONTRAST:  20mL MULTIHANCE GADOBENATE DIMEGLUMINE 529 MG/ML IV SOLN
BUN and creatinine were obtained on site at [HOSPITAL] at
[HOSPITAL].
Results:  BUN 16 mg/dL,  Creatinine 1.1 mg/dL.

[Series 3: T2 · sagittal · 4.0mm · 0.55mm/px · 3 of 14 slices shown (1 of 2)]
[im 1/14]
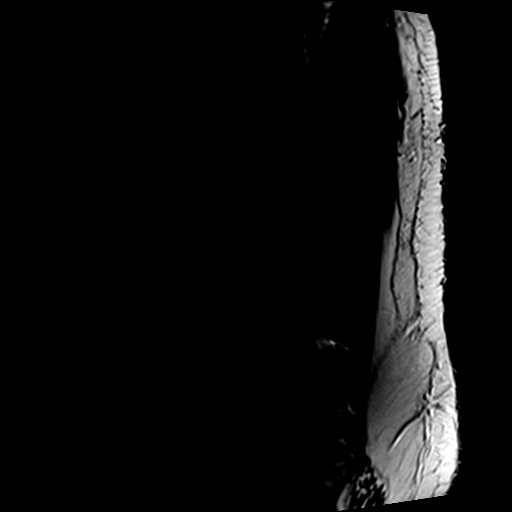
[im 7/14]
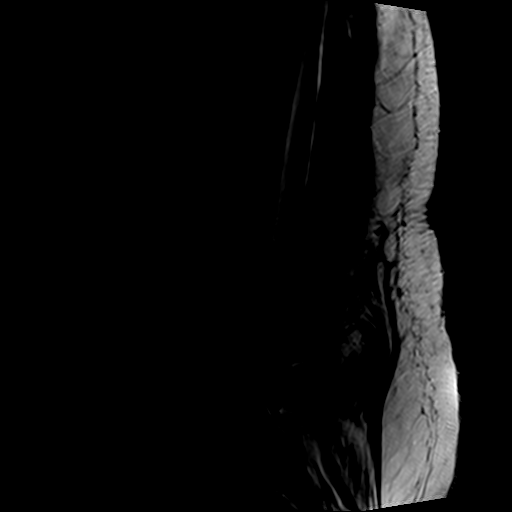
[im 14/14]
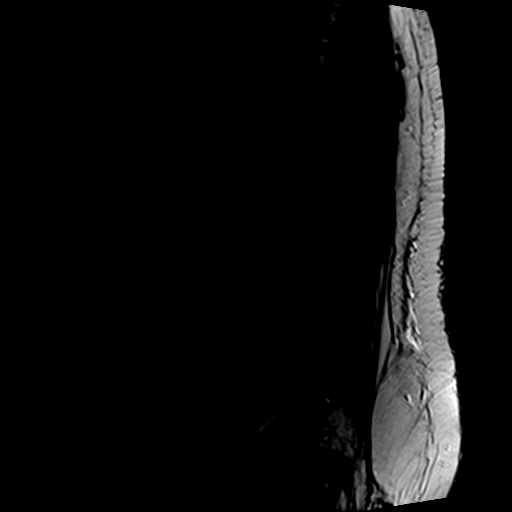

[Series 5: T1 · sagittal · 4.0mm · 0.55mm/px · 4 of 14 slices shown (1 of 2)]
[im 1/14]
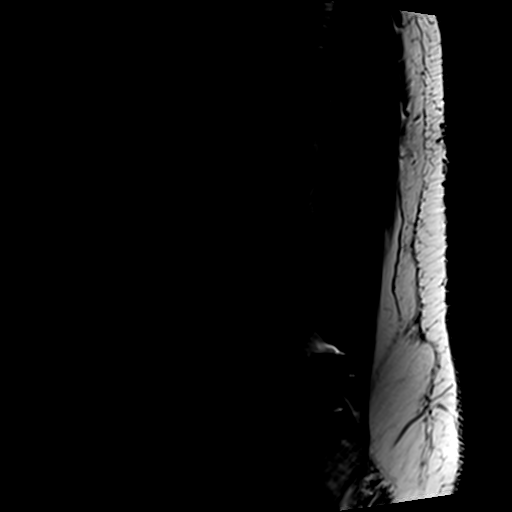
[im 5/14]
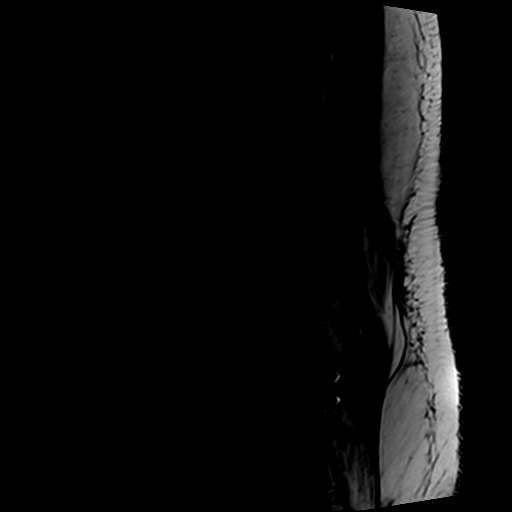
[im 9/14]
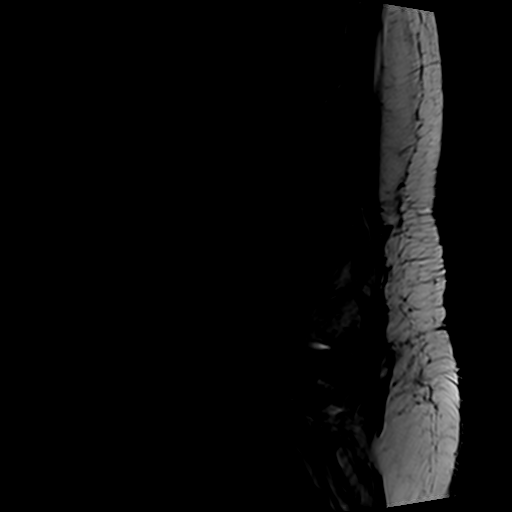
[im 14/14]
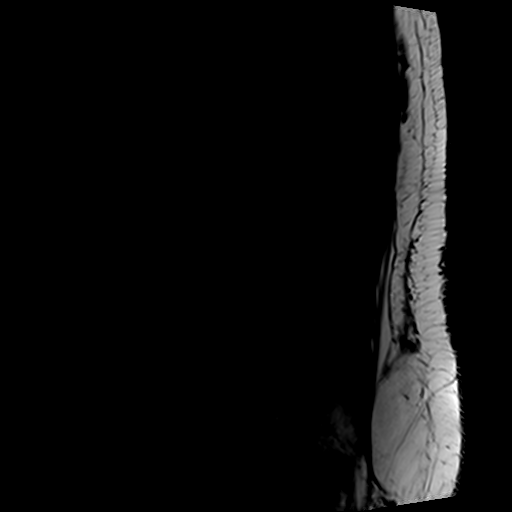

[Series 6: T2 · axial · 4.0mm · 0.70mm/px · z∈[-79,+105]mm · 9 of 35 slices shown (2 of 2)]
[im 1/35]
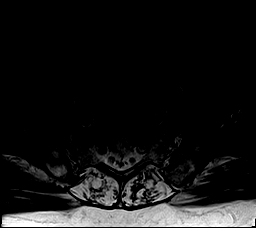
[im 5/35]
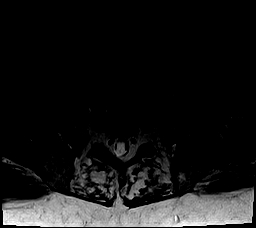
[im 9/35]
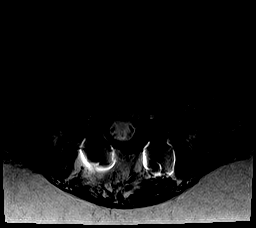
[im 13/35]
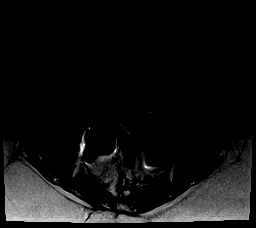
[im 18/35]
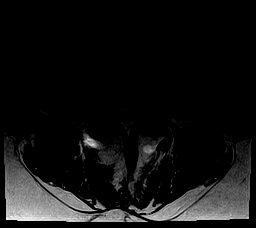
[im 22/35]
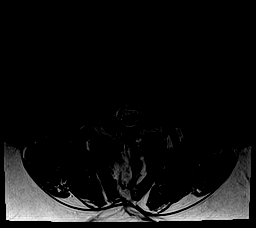
[im 26/35]
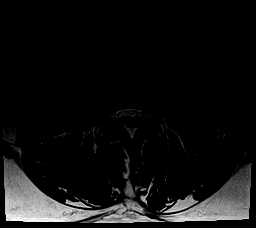
[im 30/35]
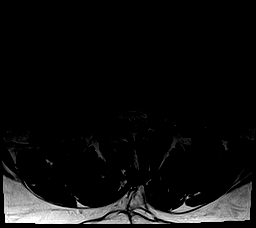
[im 35/35]
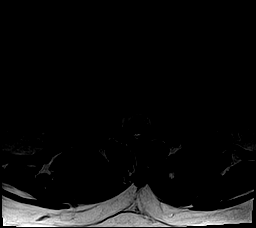

[Series 8: T1 · axial · 4.0mm · 0.35mm/px · z∈[-79,+105]mm · 9 of 35 slices shown (2 of 2)]
[im 1/35]
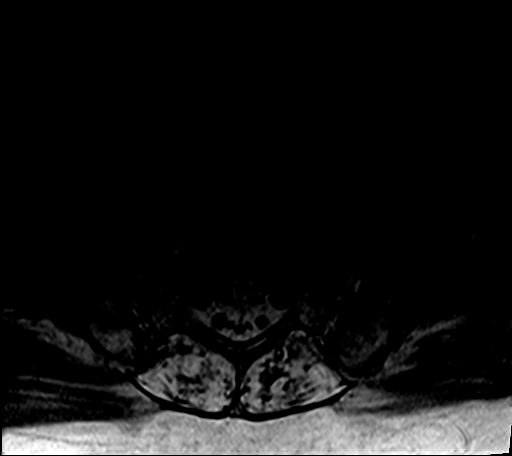
[im 5/35]
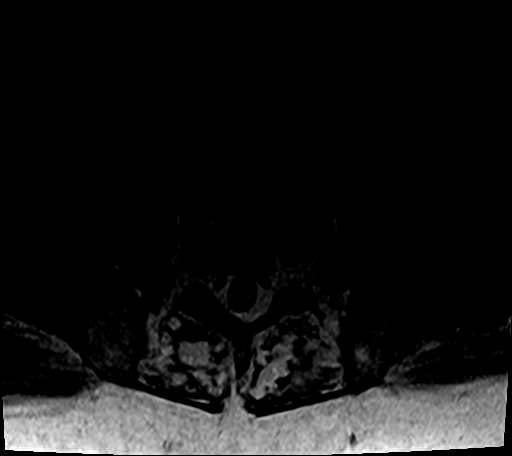
[im 9/35]
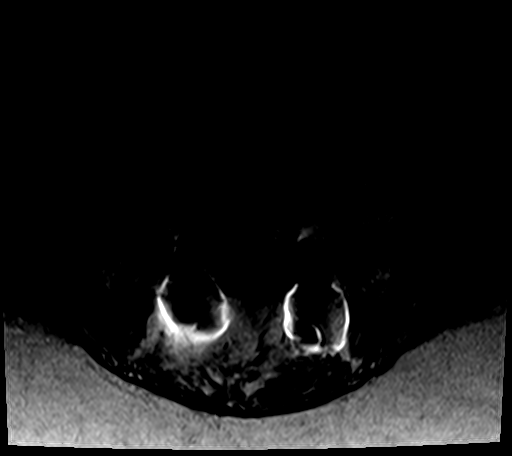
[im 13/35]
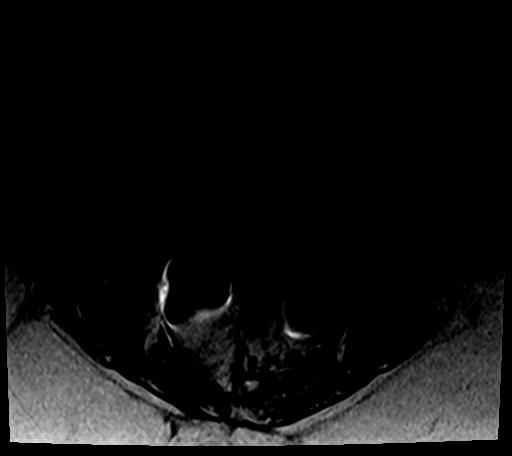
[im 18/35]
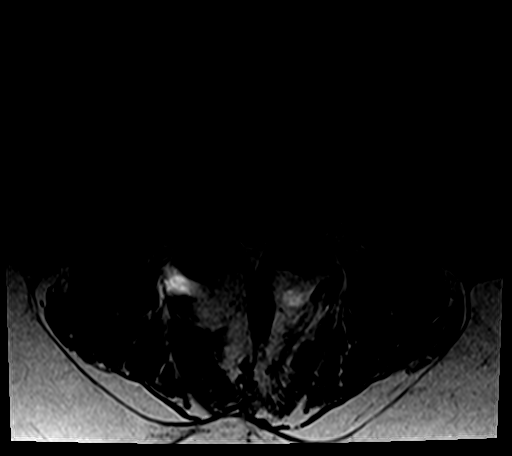
[im 22/35]
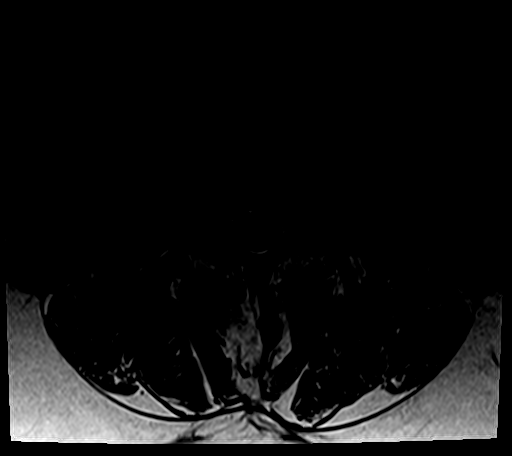
[im 26/35]
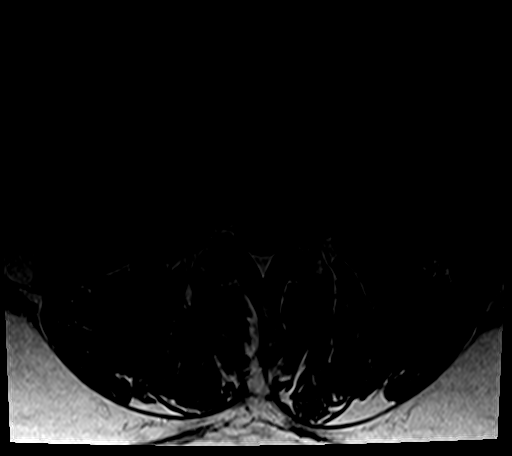
[im 30/35]
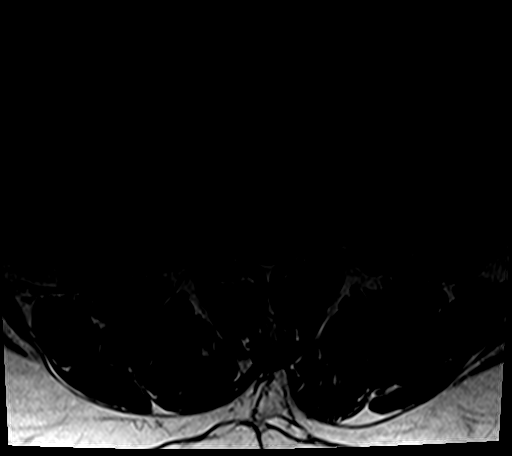
[im 35/35]
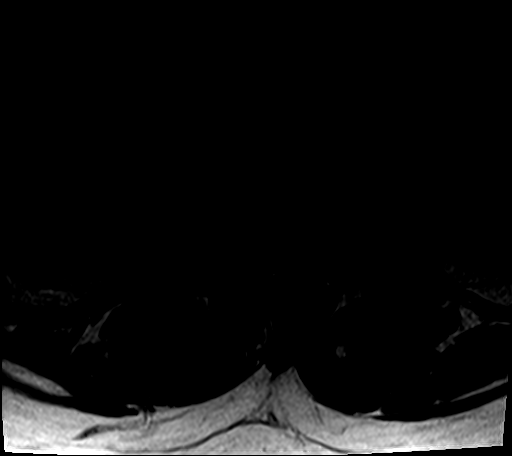

[Series 11: T1 fat-sat post-contrast · sagittal · 4.0mm · 0.55mm/px · 1 of 14 slices shown]
[im 1/14]
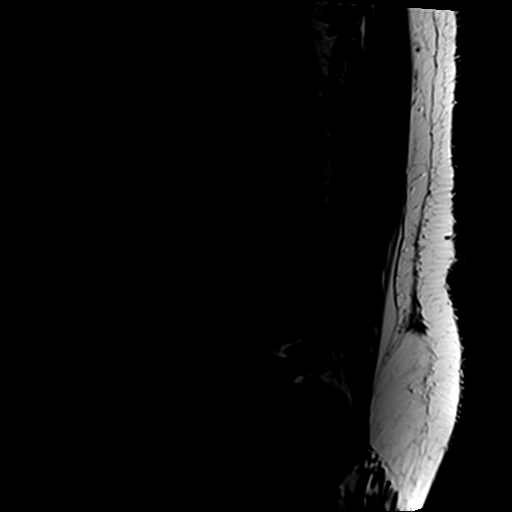

[26 of 48 positions shown; findings below may reference images not displayed]

FINDINGS: L5-S1 discectomy with posterior rod and pedicle screw fixation.
Intervertebral graft is in good position and there is probable bony
fusion.

No marrow signal abnormality suggestive of fracture, infection, or
neoplasm. Normal conus signal and morphology. No perispinal
abnormality to explain back pain.

Degenerative changes:

T12- L1: Unremarkable.

L1-L2: Unremarkable.

L2-L3: Unremarkable.

L3-L4: Small central disc protrusion with annular fissure. No
impingement. Mild facet overgrowth.

L4-L5: Facets are distorted by hardware but likely degenerated and
the cause of retrolisthesis. Sizable left paracentral disc
protrusion extending to the left foraminal region. There is mass
effect on the left L5 nerve root within the subarticular recess. The
thecal sac is moderately stenotic, but mainly from epidural fat.
Left and right foraminal stenosis is moderate, with incomplete
perineural fat effacement.

L5-S1:Discectomy with posterior fixation. The hardware is in good
position. No abnormal clumping or enhancement to suggest
arachnoiditis. No evidence of recurrent canal or shift foraminal
stenosis.
IMPRESSION: 1. Advanced adjacent segment disease at L4-5 with sizable left
paracentral disc protrusion compressing the left L5 nerve root.
Bilateral foraminal stenosis at this level is moderate.
2. L5-S1 discectomy with no residual stenosis.

## 2017-06-05 ENCOUNTER — Ambulatory Visit (INDEPENDENT_AMBULATORY_CARE_PROVIDER_SITE_OTHER): Payer: Medicare Other | Admitting: *Deleted

## 2017-06-05 DIAGNOSIS — J309 Allergic rhinitis, unspecified: Secondary | ICD-10-CM

## 2017-06-10 ENCOUNTER — Ambulatory Visit (INDEPENDENT_AMBULATORY_CARE_PROVIDER_SITE_OTHER): Payer: Medicare Other | Admitting: Physician Assistant

## 2017-06-10 VITALS — BP 131/73 | HR 53 | Temp 98.2°F | Ht 65.0 in | Wt 230.0 lb

## 2017-06-10 DIAGNOSIS — E559 Vitamin D deficiency, unspecified: Secondary | ICD-10-CM | POA: Diagnosis not present

## 2017-06-10 DIAGNOSIS — R7303 Prediabetes: Secondary | ICD-10-CM

## 2017-06-10 DIAGNOSIS — Z6838 Body mass index (BMI) 38.0-38.9, adult: Secondary | ICD-10-CM

## 2017-06-10 DIAGNOSIS — E669 Obesity, unspecified: Secondary | ICD-10-CM | POA: Diagnosis not present

## 2017-06-10 DIAGNOSIS — IMO0001 Reserved for inherently not codable concepts without codable children: Secondary | ICD-10-CM

## 2017-06-10 MED ORDER — VITAMIN D (ERGOCALCIFEROL) 1.25 MG (50000 UNIT) PO CAPS: 50000.0000 [IU] | ORAL_CAPSULE | ORAL | 0 refills | Status: DC

## 2017-06-10 MED ORDER — METFORMIN HCL 500 MG PO TABS: 500.0000 mg | ORAL_TABLET | Freq: Every morning | ORAL | 0 refills | Status: DC

## 2017-06-10 NOTE — Progress Notes (Addendum)
Office: [phone]  /  Fax: [phone]   HPI:   Chief Complaint: OBESITY Tyler [name] is here to discuss his progress with his obesity treatment plan. He is on the  follow the Category 2 plan and is following his eating plan approximately 99.9 % of the time. He states he is walking for 30 minutes 3 to 4 times per week. Kenroy continues to do well with weight loss. He is incorporating variety to his meals and states hunger is well controlled. His weight is 230 lb (104.3 kg) today and has had a weight loss of 1 pound over a period of 2 weeks since his last visit. He has lost 6 lbs since starting treatment with [place].  Vitamin D deficiency Tyler [name] has a diagnosis of vitamin D deficiency. He is currently taking vit D and denies nausea, vomiting or muscle weakness.  Pre-Diabetes Tyler [name] has a diagnosis of pre-diabetes based on his elevated Hgb A1c and was informed this puts him at greater risk of developing diabetes. He is taking metformin currently and continues to work on diet and exercise to decrease risk of diabetes. He denies nausea, polyphagia or hypoglycemia.   ALLERGIES: No Known Allergies  MEDICATIONS: Current Outpatient Prescriptions on File Prior to Visit  Medication Sig Dispense Refill  . acetaminophen (TYLENOL) 500 MG tablet Take 500 mg by mouth every 6 (six) hours as needed for mild pain.    Marland Kitchen amLODipine (NORVASC) 5 MG tablet Take 5 mg by mouth daily.    . Cholecalciferol (VITAMIN D3) 2000 UNITS TABS Take 2,000 Units by mouth daily.    . CHROMIUM PO Take 1 tablet by mouth every morning.    Marland Kitchen CRESTOR 5 MG tablet Take 5 mg by mouth at bedtime.  1  . dextromethorphan-guaiFENesin (MUCINEX DM) 30-600 MG 12hr tablet Take 1 tablet by mouth 2 (two) times daily as needed for cough.    . docusate sodium (COLACE) 100 MG capsule Take 200 mg by mouth at bedtime as needed for mild constipation.    Marland Kitchen HYDROcodone-acetaminophen (NORCO/VICODIN) 5-325 MG tablet Take 1-2 tablets by mouth every 6 (six)  hours as needed for moderate pain. 80 tablet 0  . metFORMIN (GLUCOPHAGE) 500 MG tablet Take 1 tablet (500 mg total) by mouth every morning. 30 tablet 0  . methocarbamol (ROBAXIN) 500 MG tablet Take 1 tablet (500 mg total) by mouth 2 (two) times daily as needed for muscle spasms. 60 tablet 1  . multivitamin-iron-minerals-folic acid (CENTRUM) chewable tablet Chew 1 tablet by mouth daily.    . Potassium 99 MG TABS Take 99 mg by mouth daily.    . QNASL 80 MCG/ACT AERS ONE SPRAY EACH NOSTRIL TWICE DAILY FOR STUFFY NOSE OR DRAINAGE. 8.7 Inhaler 0  . triamterene-hydrochlorothiazide (MAXZIDE-25) 37.5-25 MG per tablet Take 0.5 tablets by mouth daily.     . Vitamin D, Ergocalciferol, (DRISDOL) 50000 units CAPS capsule Take 1 capsule (50,000 Units total) by mouth every 7 (seven) days. 4 capsule 0   No current facility-administered medications on file prior to visit.     PAST MEDICAL HISTORY: Past Medical History:  Diagnosis Date  . Back pain   . Heartburn   . Hyperlipidemia   . Hypertension   . Insulin resistance   . Obesity     PAST SURGICAL HISTORY: Past Surgical History:  Procedure Laterality Date  . BACK SURGERY     03/14/2004  . EYE SURGERY     bil cataract  02/2013  . LUMBAR LAMINECTOMY/DECOMPRESSION MICRODISCECTOMY N/A 05/19/2015  Procedure: Lumbar four- five diskectomy;  Surgeon: Coletta Memos, MD;  Location: [facility];  Service: Neurosurgery;  Laterality: N/A;  L45 diskectomy    SOCIAL HISTORY: Social History  Substance Use Topics  . Smoking status: Former Games developer  . Smokeless tobacco: Never Used     Comment: quit smokling in '70s  . Alcohol use 2.4 oz/week    4 Shots of liquor per week     Comment: social     FAMILY HISTORY: Family History  Problem Relation Age of Onset  . Hypertension Father   . Hyperlipidemia Father   . Heart disease Father   . Obesity Father     ROS: Review of Systems  Constitutional: Positive for weight loss.  Gastrointestinal: Negative for  nausea and vomiting.  Musculoskeletal:       Negative muscle weakness  Endo/Heme/Allergies:       Negative polyphagia Negative hypoglycemia    PHYSICAL EXAM: Blood pressure 131/73, pulse (!) 53, temperature 98.2 F (36.8 C), temperature source Oral, height 5\' 5"  (1.651 m), weight 230 lb (104.3 kg), SpO2 97 %. Body mass index is 38.27 kg/m. Physical Exam  Constitutional: He is oriented to person, place, and time. He appears well-developed and well-nourished.  Cardiovascular:  bradycardic  Pulmonary/Chest: Effort normal.  Musculoskeletal: Normal range of motion.  Neurological: He is oriented to person, place, and time.  Skin: Skin is warm and dry.  Psychiatric: He has a normal mood and affect. His behavior is normal.  Vitals reviewed.   RECENT LABS AND TESTS: BMET    Component Value Date/Time   NA 141 04/01/2017 1010   K 4.4 04/01/2017 1010   CL 103 04/01/2017 1010   CO2 25 04/01/2017 1010   GLUCOSE 114 (H) 04/01/2017 1010   GLUCOSE 112 (H) 08/25/2015 1342   BUN 12 04/01/2017 1010   CREATININE 0.98 04/01/2017 1010   CALCIUM 9.7 04/01/2017 1010   GFRNONAA 79 04/01/2017 1010   GFRAA 91 04/01/2017 1010   Lab Results  Component Value Date   HGBA1C 6.4 (H) 04/01/2017   Lab Results  Component Value Date   INSULIN 14.6 04/01/2017   CBC    Component Value Date/Time   WBC 5.4 04/01/2017 1010   WBC 6.6 08/25/2015 1342   RBC 4.82 04/01/2017 1010   RBC 4.69 08/25/2015 1342   HGB 14.5 04/01/2017 1010   HCT 42.7 04/01/2017 1010   PLT 271 08/25/2015 1342   MCV 89 04/01/2017 1010   MCH 30.1 04/01/2017 1010   MCH 29.9 08/25/2015 1342   MCHC 34.0 04/01/2017 1010   MCHC 33.2 08/25/2015 1342   RDW 14.1 04/01/2017 1010   LYMPHSABS 2.4 04/01/2017 1010   EOSABS 0.1 04/01/2017 1010   BASOSABS 0.0 04/01/2017 1010   Iron/TIBC/Ferritin/ %Sat No results found for: IRON, TIBC, FERRITIN, IRONPCTSAT Lipid Panel     Component Value Date/Time   CHOL 233 (H) 04/01/2017 1010    TRIG 130 04/01/2017 1010   HDL 59 04/01/2017 1010   LDLCALC 148 (H) 04/01/2017 1010   Hepatic Function Panel     Component Value Date/Time   PROT 6.8 04/01/2017 1010   ALBUMIN 4.1 04/01/2017 1010   AST 13 04/01/2017 1010   ALT 14 04/01/2017 1010   ALKPHOS 63 04/01/2017 1010   BILITOT 0.2 04/01/2017 1010      Component Value Date/Time   TSH 1.350 04/01/2017 1010    ASSESSMENT AND PLAN: Prediabetes - Plan: metFORMIN (GLUCOPHAGE) 500 MG tablet  Vitamin D deficiency -  Plan: Vitamin D, Ergocalciferol, (DRISDOL) 50000 units CAPS capsule  Class 2 obesity with serious comorbidity and body mass index (BMI) of 38.0 to 38.9 in adult, unspecified obesity type  PLAN:  Vitamin D Deficiency Tyler [name] was informed that low vitamin D levels contributes to fatigue and are associated with obesity, breast, and colon cancer. He agrees to continue to take prescription Vit D @50 ,000 IU every week, Tyler [name] will refill for 1 month and will follow up for routine testing of vitamin D, at least 2-3 times per year. He was informed of the risk of over-replacement of vitamin D and agrees to not increase his dose unless he discusses this with [place] first. Tyler [name] agrees to follow up with our clinic in 2 weeks.  Pre-Diabetes Tyler [name] will continue to work on weight loss, exercise, and decreasing simple carbohydrates in his diet to help decrease the risk of diabetes. Tyler [name] dicussed metformin including benefits and risks. He was informed that eating too many simple carbohydrates or too many calories at one sitting increases the likelihood of GI side effects. Tyler [name] agrees to continue metformin for now and a prescription was written today for 1 month refill. Tyler [name] agreed to follow up with [place] as directed to monitor his progress.  Obesity Tyler [name] is currently in the action stage of change. As such, his goal is to continue with weight loss efforts He has agreed to follow the Category 2 plan Tyler [name] has been instructed to work up to a goal of  150 minutes of combined cardio and strengthening exercise per week for weight loss and overall health benefits. Tyler [name] discussed the following Behavioral Modification Strategies today: increasing lean protein intake and meal planning & cooking strategies  Tyler [name] has agreed to follow up with our clinic in 2 weeks. He was informed of the importance of frequent follow up visits to maximize his success with intensive lifestyle modifications for his multiple health conditions.  Tyler [name], Tyler [name], am acting as transcriptionist for Tyler Level, Tyler [name]  Tyler [name] have reviewed the above documentation for accuracy and completeness, and Tyler [name] agree with the above. -Tyler Level, Tyler [name]    OBESITY BEHAVIORAL INTERVENTION VISIT  Today's visit was # 6 out of 22.  Starting weight: 236 lbs Starting date: 04/01/17 Today's weight : 230 lbs Today's date: 06/10/2017 Total lbs lost to date: 6 (Patients must lose 7 lbs in the first 6 months to continue with counseling)   ASK: Tyler [name] discussed the diagnosis of obesity with Tyler [name] today and Tyler [name] agreed to give [place] permission to discuss obesity behavioral modification therapy today.  ASSESS: Nathanel has the diagnosis of obesity and his BMI today is 38.27 Tyler [name] is in the action stage of change   ADVISE: Mickell was educated on the multiple health risks of obesity as well as the benefit of weight loss to improve his health. He was advised of the need for long term treatment and the importance of lifestyle modifications.  AGREE: Multiple dietary modification options and treatment options were discussed and  Ryder agreed to follow the Category 2 plan Tyler [name] discussed the following Behavioral Modification Strategies today: increasing lean protein intake and meal planning & cooking strategies

## 2017-06-18 ENCOUNTER — Telehealth (INDEPENDENT_AMBULATORY_CARE_PROVIDER_SITE_OTHER): Payer: Self-pay | Admitting: Family Medicine

## 2017-06-18 ENCOUNTER — Encounter (INDEPENDENT_AMBULATORY_CARE_PROVIDER_SITE_OTHER): Payer: Self-pay

## 2017-06-18 NOTE — Telephone Encounter (Signed)
The pt's prescriptions are at the pharmacy to be picked up. Sent the patient a [employer] message. April, CMA

## 2017-06-18 NOTE — Telephone Encounter (Signed)
Vitamin D, Ergocalciferol, (DRISDOL) 50000 units CAPS capsule and  [facility] [address]

## 2017-06-24 ENCOUNTER — Ambulatory Visit (INDEPENDENT_AMBULATORY_CARE_PROVIDER_SITE_OTHER): Payer: Medicare Other | Admitting: Physician Assistant

## 2017-06-24 VITALS — BP 128/74 | HR 55 | Temp 97.8°F | Ht 65.0 in | Wt 229.0 lb

## 2017-06-24 DIAGNOSIS — Z6838 Body mass index (BMI) 38.0-38.9, adult: Secondary | ICD-10-CM

## 2017-06-24 DIAGNOSIS — IMO0001 Reserved for inherently not codable concepts without codable children: Secondary | ICD-10-CM

## 2017-06-24 DIAGNOSIS — E669 Obesity, unspecified: Secondary | ICD-10-CM

## 2017-06-24 DIAGNOSIS — I10 Essential (primary) hypertension: Secondary | ICD-10-CM | POA: Diagnosis not present

## 2017-06-24 NOTE — Progress Notes (Signed)
Office: [phone]  /  Fax: [phone]   HPI:   Chief Complaint: OBESITY Tyler [name] is here to discuss his progress with his obesity treatment plan. He is on the Category 2 plan and is following his eating plan approximately 90 % of the time. He states he is exercising 0 minutes 0 times per week. Tyler [name] continues to do well with weight loss. He is retaining fluid. Tyler [name] states he has not taken his blood [name] medications for the last 2 days. His weight is 229 lb (103.9 kg) today and has had a weight loss of 1 pound over a period of 3 weeks since his last visit. He has lost 8 lbs since starting treatment with Tyler [name].  Hypertension Tyler [name] is a 71 y.o. male with hypertension. He states he has not taken blood [name] medications for the past 2 days due to side effect of increased urination.  Tyler [name] denies chest pain or shortness of breath on exertion. He is working weight loss to help control his blood [name] with the goal of decreasing his risk of heart attack and stroke. Tyler [name] is currently stable.  ALLERGIES: No Known Allergies  MEDICATIONS: Current Outpatient Prescriptions on File Prior to Visit  Medication Sig Dispense Refill  . acetaminophen (TYLENOL) 500 MG tablet Take 500 mg by mouth every 6 (six) hours as needed for mild pain.    Marland Kitchen. amLODipine (NORVASC) 5 MG tablet Take 5 mg by mouth daily.    . Cholecalciferol (VITAMIN D3) 2000 UNITS TABS Take 2,000 Units by mouth daily.    . CRESTOR 5 MG tablet Take 5 mg by mouth at bedtime.  1  . docusate sodium (COLACE) 100 MG capsule Take 200 mg by mouth at bedtime as needed for mild constipation.    . metFORMIN (GLUCOPHAGE) 500 MG tablet Take 1 tablet (500 mg total) by mouth every morning. 30 tablet 0  . multivitamin-iron-minerals-folic acid (CENTRUM) chewable tablet Chew 1 tablet by mouth daily.    . QNASL 80 MCG/ACT AERS ONE SPRAY EACH NOSTRIL TWICE DAILY FOR STUFFY NOSE OR DRAINAGE. 8.7 Inhaler 0   . triamterene-hydrochlorothiazide (MAXZIDE-25) 37.5-25 MG per tablet Take 0.5 tablets by mouth daily.     . Vitamin D, Ergocalciferol, (DRISDOL) 50000 units CAPS capsule Take 1 capsule (50,000 Units total) by mouth every 7 (seven) days. 4 capsule 0  . CHROMIUM PO Take 1 tablet by mouth every morning.    Marland Kitchen. dextromethorphan-guaiFENesin (MUCINEX DM) 30-600 MG 12hr tablet Take 1 tablet by mouth 2 (two) times daily as needed for cough.    . Potassium 99 MG TABS Take 99 mg by mouth daily.     No current facility-administered medications on file prior to visit.     PAST MEDICAL HISTORY: Past Medical History:  Diagnosis Date  . Back pain   . Heartburn   . Hyperlipidemia   . Hypertension   . Insulin resistance   . Obesity     PAST SURGICAL HISTORY: Past Surgical History:  Procedure Laterality Date  . BACK SURGERY     03/14/2004  . EYE SURGERY     bil cataract  02/2013  . LUMBAR LAMINECTOMY/DECOMPRESSION MICRODISCECTOMY N/A 05/19/2015   Procedure: Lumbar four- five diskectomy;  Surgeon: Coletta MemosKyle Cabbell, MD;  Location: [facility];  Service: Neurosurgery;  Laterality: N/A;  L45 diskectomy    SOCIAL HISTORY: Social History  Substance Use Topics  . Smoking status: Former Games developermoker  . Smokeless tobacco: Never Used  Comment: quit smokling in '70s  . Alcohol use 2.4 oz/week    4 Shots of liquor per week     Comment: social     FAMILY HISTORY: Family History  Problem Relation Age of Onset  . Hypertension Father   . Hyperlipidemia Father   . Heart disease Father   . Obesity Father     ROS: Review of Systems  Constitutional: Positive for weight loss.  Respiratory: Negative for shortness of breath (on exertion).   Cardiovascular: Negative for chest pain.    PHYSICAL EXAM: Blood [name] 128/74, pulse (!) 55, temperature 97.8 F (36.6 C), temperature source Oral, height 5\' 5"  (1.651 m), weight 229 lb (103.9 kg), SpO2 99 %. Body mass index is 38.11 kg/m. Physical Exam    Constitutional: He is oriented to person, place, and time. He appears well-developed and well-nourished.  Cardiovascular:  Bradycardic  Pulmonary/Chest: Effort normal.  Musculoskeletal: Normal range of motion.  Neurological: He is oriented to person, place, and time.  Skin: Skin is warm and dry.  Psychiatric: He has a normal mood and affect. His behavior is normal.  Vitals reviewed.   RECENT LABS AND TESTS: BMET    Component Value Date/Time   NA 141 04/01/2017 1010   K 4.4 04/01/2017 1010   CL 103 04/01/2017 1010   CO2 25 04/01/2017 1010   GLUCOSE 114 (H) 04/01/2017 1010   GLUCOSE 112 (H) 08/25/2015 1342   BUN 12 04/01/2017 1010   CREATININE 0.98 04/01/2017 1010   CALCIUM 9.7 04/01/2017 1010   GFRNONAA 79 04/01/2017 1010   GFRAA 91 04/01/2017 1010   Lab Results  Component Value Date   HGBA1C 6.4 (H) 04/01/2017   Lab Results  Component Value Date   INSULIN 14.6 04/01/2017   CBC    Component Value Date/Time   WBC 5.4 04/01/2017 1010   WBC 6.6 08/25/2015 1342   RBC 4.82 04/01/2017 1010   RBC 4.69 08/25/2015 1342   HGB 14.5 04/01/2017 1010   HCT 42.7 04/01/2017 1010   PLT 271 08/25/2015 1342   MCV 89 04/01/2017 1010   MCH 30.1 04/01/2017 1010   MCH 29.9 08/25/2015 1342   MCHC 34.0 04/01/2017 1010   MCHC 33.2 08/25/2015 1342   RDW 14.1 04/01/2017 1010   LYMPHSABS 2.4 04/01/2017 1010   EOSABS 0.1 04/01/2017 1010   BASOSABS 0.0 04/01/2017 1010   Iron/TIBC/Ferritin/ %Sat No results found for: IRON, TIBC, FERRITIN, IRONPCTSAT Lipid Panel     Component Value Date/Time   CHOL 233 (H) 04/01/2017 1010   TRIG 130 04/01/2017 1010   HDL 59 04/01/2017 1010   LDLCALC 148 (H) 04/01/2017 1010   Hepatic Function Panel     Component Value Date/Time   PROT 6.8 04/01/2017 1010   ALBUMIN 4.1 04/01/2017 1010   AST 13 04/01/2017 1010   ALT 14 04/01/2017 1010   ALKPHOS 63 04/01/2017 1010   BILITOT 0.2 04/01/2017 1010      Component Value Date/Time   TSH 1.350  04/01/2017 1010    ASSESSMENT AND PLAN: Essential hypertension  Class 2 obesity with serious comorbidity and body mass index (BMI) of 38.0 to 38.9 in adult, unspecified obesity type  PLAN:  Hypertension We discussed sodium restriction, working on healthy weight loss, and a regular exercise program as the means to achieve improved blood [name] control. Tyler Fearing agreed with this plan and agreed to follow up as directed. We will continue to monitor his blood [name] as well as his progress with the  above lifestyle modifications. He was advised to take his blood [name] mediation and don't skip. Tyler [name] agrees that he will continue his medications as prescribed and will watch for signs of hypotension as he continues his lifestyle modifications.  We spent > than 50% of the 15 minute visit on the counseling as documented in the note.  Obesity Tyler [name] is currently in the action stage of change. As such, his goal is to continue with weight loss efforts He has agreed to follow the Category 2 plan Tyler [name] has been instructed to work up to a goal of 150 minutes of combined cardio and strengthening exercise per week for weight loss and overall health benefits. We discussed the following Behavioral Modification Strategies today: increasing lean protein intake and work on meal planning and easy cooking plans  Tyler [name] has agreed to follow up with our clinic in 2 weeks. He was informed of the importance of frequent follow up visits to maximize his success with intensive lifestyle modifications for his multiple health conditions.  I, [place] Crane, am acting as transcriptionist for Illa Level, PA-C  I have reviewed the above documentation for accuracy and completeness, and I agree with the above. -Illa Level, PA-C  I have reviewed the above note and agree with the plan. -Tyler Quince, MD   OBESITY BEHAVIORAL INTERVENTION VISIT  Today's visit was # 7 out of 22.  Starting weight: 236 lbs Starting date:  04/01/17 Today's weight : 229 lbs Today's date: 06/24/2017 Total lbs lost to date: 8 (Patients must lose 7 lbs in the first 6 months to continue with counseling)   ASK: We discussed the diagnosis of obesity with Tyler Jew Goodin today and Mataeo agreed to give [place] permission to discuss obesity behavioral modification therapy today.  ASSESS: Alvino has the diagnosis of obesity and his BMI today is 38.11 Wilhelm is in the action stage of change   ADVISE: Kerwin was educated on the multiple health risks of obesity as well as the benefit of weight loss to improve his health. He was advised of the need for long term treatment and the importance of lifestyle modifications.  AGREE: Multiple dietary modification options and treatment options were discussed and  Martice agreed to follow the Category 2 plan We discussed the following Behavioral Modification Strategies today: increasing lean protein intake and work on meal planning and easy cooking plans

## 2017-06-27 ENCOUNTER — Ambulatory Visit (INDEPENDENT_AMBULATORY_CARE_PROVIDER_SITE_OTHER): Payer: Medicare Other

## 2017-06-27 DIAGNOSIS — J309 Allergic rhinitis, unspecified: Secondary | ICD-10-CM

## 2017-07-10 ENCOUNTER — Ambulatory Visit (INDEPENDENT_AMBULATORY_CARE_PROVIDER_SITE_OTHER): Payer: Medicare Other | Admitting: Physician Assistant

## 2017-07-10 ENCOUNTER — Ambulatory Visit (INDEPENDENT_AMBULATORY_CARE_PROVIDER_SITE_OTHER): Payer: Medicare Other

## 2017-07-10 VITALS — BP 146/79 | HR 48 | Temp 97.7°F | Ht 65.0 in | Wt 225.0 lb

## 2017-07-10 DIAGNOSIS — J309 Allergic rhinitis, unspecified: Secondary | ICD-10-CM

## 2017-07-10 DIAGNOSIS — Z6837 Body mass index (BMI) 37.0-37.9, adult: Secondary | ICD-10-CM

## 2017-07-10 DIAGNOSIS — E669 Obesity, unspecified: Secondary | ICD-10-CM | POA: Diagnosis not present

## 2017-07-10 DIAGNOSIS — E559 Vitamin D deficiency, unspecified: Secondary | ICD-10-CM | POA: Diagnosis not present

## 2017-07-10 DIAGNOSIS — IMO0001 Reserved for inherently not codable concepts without codable children: Secondary | ICD-10-CM

## 2017-07-10 DIAGNOSIS — R7303 Prediabetes: Secondary | ICD-10-CM | POA: Diagnosis not present

## 2017-07-10 DIAGNOSIS — E784 Other hyperlipidemia: Secondary | ICD-10-CM | POA: Diagnosis not present

## 2017-07-10 DIAGNOSIS — E7849 Other hyperlipidemia: Secondary | ICD-10-CM

## 2017-07-10 DIAGNOSIS — I10 Essential (primary) hypertension: Secondary | ICD-10-CM | POA: Diagnosis not present

## 2017-07-10 MED ORDER — VITAMIN D (ERGOCALCIFEROL) 1.25 MG (50000 UNIT) PO CAPS: 50000.0000 [IU] | ORAL_CAPSULE | ORAL | 0 refills | Status: DC

## 2017-07-10 NOTE — Progress Notes (Signed)
Office: [phone]  /  Fax: [phone]   HPI:   Chief Complaint: OBESITY Tyler [name] is here to discuss his progress with his obesity treatment plan. He is on the  follow the Category 2 plan and is following his eating plan approximately 90 % of the time. He states he is exercising 0 minutes 0 times per week. Tyler [name] continues to do weill with weight loss. He has done well with planning meals ahead of time. Tyler [name] will be traveling and wants travel eating strategies. His weight is 225 lb (102.1 kg) today and has had a weight loss of 4 pounds over a period of 2 weeks since his last visit. He has lost 11 lbs since starting treatment with [place].  Vitamin D deficiency Tyler [name] has a diagnosis of vitamin D deficiency. He is currently taking vit D and denies nausea, vomiting or muscle weakness.  Hypertension Tyler [name] is a 71 y.o. male with hypertension. His blood pressure is elevated and he states          Tyler [name] denies chest pain or shortness of breath on exertion. He is working weight loss to help control his blood pressure with the goal of decreasing his risk of heart attack and stroke. Tyler [name] blood pressure is not currently controlled.   ALLERGIES: No Known Allergies  MEDICATIONS: Current Outpatient Prescriptions on File Prior to Visit  Medication Sig Dispense Refill  . acetaminophen (TYLENOL) 500 MG tablet Take 500 mg by mouth every 6 (six) hours as needed for mild pain.    Marland Kitchen amLODipine (NORVASC) 5 MG tablet Take 5 mg by mouth daily.    . Cholecalciferol (VITAMIN D3) 2000 UNITS TABS Take 2,000 Units by mouth daily.    . CHROMIUM PO Take 1 tablet by mouth every morning.    Marland Kitchen CRESTOR 5 MG tablet Take 5 mg by mouth at bedtime.  1  . dextromethorphan-guaiFENesin (MUCINEX DM) 30-600 MG 12hr tablet Take 1 tablet by mouth 2 (two) times daily as needed for cough.    . docusate sodium (COLACE) 100 MG capsule Take 200 mg by mouth at bedtime as needed for mild constipation.      . metFORMIN (GLUCOPHAGE) 500 MG tablet Take 1 tablet (500 mg total) by mouth every morning. 30 tablet 0  . multivitamin-iron-minerals-folic acid (CENTRUM) chewable tablet Chew 1 tablet by mouth daily.    . Potassium 99 MG TABS Take 99 mg by mouth daily.    . QNASL 80 MCG/ACT AERS ONE SPRAY EACH NOSTRIL TWICE DAILY FOR STUFFY NOSE OR DRAINAGE. 8.7 Inhaler 0  . triamterene-hydrochlorothiazide (MAXZIDE-25) 37.5-25 MG per tablet Take 0.5 tablets by mouth daily.      No current facility-administered medications on file prior to visit.     PAST MEDICAL HISTORY: Past Medical History:  Diagnosis Date  . Back pain   . Heartburn   . Hyperlipidemia   . Hypertension   . Insulin resistance   . Obesity     PAST SURGICAL HISTORY: Past Surgical History:  Procedure Laterality Date  . BACK SURGERY     03/14/2004  . EYE SURGERY     bil cataract  02/2013  . LUMBAR LAMINECTOMY/DECOMPRESSION MICRODISCECTOMY N/A 05/19/2015   Procedure: Lumbar four- five diskectomy;  Surgeon: Coletta Memos, MD;  Location: [facility];  Service: Neurosurgery;  Laterality: N/A;  L45 diskectomy    SOCIAL HISTORY: Social History  Substance Use Topics  . Smoking status: Former Games developer  . Smokeless tobacco: Never Used  Comment: quit smokling in '70s  . Alcohol use 2.4 oz/week    4 Shots of liquor per week     Comment: social     FAMILY HISTORY: Family History  Problem Relation Age of Onset  . Hypertension Father   . Hyperlipidemia Father   . Heart disease Father   . Obesity Father     ROS: Review of Systems  Constitutional: Positive for weight loss.  Respiratory: Negative for shortness of breath (on exertion).   Cardiovascular: Negative for chest pain.  Gastrointestinal: Negative for nausea and vomiting.  Musculoskeletal:       Negative muscle weakness    PHYSICAL EXAM: Blood pressure (!) 146/79, pulse (!) 48, temperature 97.7 F (36.5 C), height  (1.651 m), weight 225 lb (102.1 kg), SpO2 98  %. Body mass index is 37.44 kg/m. Physical Exam  Constitutional: He is oriented to person, place, and time. He appears well-developed and well-nourished.  Cardiovascular:  Bradycardic  Pulmonary/Chest: Effort normal.  Musculoskeletal: Normal range of motion.  Neurological: He is oriented to person, place, and time.  Skin: Skin is warm and dry.  Psychiatric: He has a normal mood and affect. His behavior is normal.  Vitals reviewed.   RECENT LABS AND TESTS: BMET    Component Value Date/Time   NA 141 04/01/2017 1010   K 4.4 04/01/2017 1010   CL 103 04/01/2017 1010   CO2 25 04/01/2017 1010   GLUCOSE 114 (H) 04/01/2017 1010   GLUCOSE 112 (H) 08/25/2015 1342   BUN 12 04/01/2017 1010   CREATININE 0.98 04/01/2017 1010   CALCIUM 9.7 04/01/2017 1010   GFRNONAA 79 04/01/2017 1010   GFRAA 91 04/01/2017 1010   Lab Results  Component Value Date   HGBA1C 6.4 (H) 04/01/2017   Lab Results  Component Value Date   INSULIN 14.6 04/01/2017   CBC    Component Value Date/Time   WBC 5.4 04/01/2017 1010   WBC 6.6 08/25/2015 1342   RBC 4.82 04/01/2017 1010   RBC 4.69 08/25/2015 1342   HGB 14.5 04/01/2017 1010   HCT 42.7 04/01/2017 1010   PLT 271 08/25/2015 1342   MCV 89 04/01/2017 1010   MCH 30.1 04/01/2017 1010   MCH 29.9 08/25/2015 1342   MCHC 34.0 04/01/2017 1010   MCHC 33.2 08/25/2015 1342   RDW 14.1 04/01/2017 1010   LYMPHSABS 2.4 04/01/2017 1010   EOSABS 0.1 04/01/2017 1010   BASOSABS 0.0 04/01/2017 1010   Iron/TIBC/Ferritin/ %Sat No results found for: IRON, TIBC, FERRITIN, IRONPCTSAT Lipid Panel     Component Value Date/Time   CHOL 233 (H) 04/01/2017 1010   TRIG 130 04/01/2017 1010   HDL 59 04/01/2017 1010   LDLCALC 148 (H) 04/01/2017 1010   Hepatic Function Panel     Component Value Date/Time   PROT 6.8 04/01/2017 1010   ALBUMIN 4.1 04/01/2017 1010   AST 13 04/01/2017 1010   ALT 14 04/01/2017 1010   ALKPHOS 63 04/01/2017 1010   BILITOT 0.2 04/01/2017 1010       Component Value Date/Time   TSH 1.350 04/01/2017 1010    ASSESSMENT AND PLAN: Vitamin D deficiency - Plan: VITAMIN D 25 Hydroxy (Vit-D Deficiency, Fractures), Vitamin D, Ergocalciferol, (DRISDOL) 50000 units CAPS capsule  Other hyperlipidemia - Plan: Lipid Panel With LDL/HDL Ratio  Prediabetes - Plan: Comprehensive metabolic panel, Hemoglobin A1c, Insulin, random  Essential hypertension  Class 2 obesity with serious comorbidity and body mass index (BMI) of 37.0 to 37.9 in adult,  unspecified obesity type  PLAN:  Vitamin D Deficiency Tyler [name] was informed that low vitamin D levels contributes to fatigue and are associated with obesity, breast, and colon cancer. He agrees to continue to take prescription Vit D ,000 IU every week, we will refill for 1 month and will follow up for routine testing of vitamin D, at least 2-3 times per year. He was informed of the risk of over-replacement of vitamin D and agrees to not increase his dose unless he discusses this with [place] first. Navjot agrees to follow up with our clinic in 3 weeks.  Hypertension We discussed sodium restriction, working on healthy weight loss, and a regular exercise program as the means to achieve improved blood pressure control. Tyler [name] agreed with this plan and agreed to follow up as directed. We will continue to monitor his blood pressure as well as his progress with the above lifestyle modifications. He will continue his medications as prescribed and will watch for signs of hypotension as he continues his lifestyle modifications.  Obesity Tyler [name] is currently in the action stage of change. As such, his goal is to continue with weight loss efforts He has agreed to follow the Category 2 plan Tyler [name] has been instructed to work up to a goal of 150 minutes of combined cardio and strengthening exercise per week for weight loss and overall health benefits. We discussed the following Behavioral Modification Strategies today:  increasing lean protein intake and travel eating strategies  Tyler [name] has agreed to follow up with our clinic in 3 weeks. He was informed of the importance of frequent follow up visits to maximize his success with intensive lifestyle modifications for his multiple health conditions.  I, [place] Crane, am acting as transcriptionist for Illa Level, PA-C  I have reviewed the above documentation for accuracy and completeness, and I agree with the above. -Illa Level, PA-C  I have reviewed the above note and agree with the plan. -Tyler Quince, MD   OBESITY BEHAVIORAL INTERVENTION VISIT  Today's visit was # 8 out of 22.  Starting weight: 236 lbs Starting date: 04/01/17 Today's weight : 225 lbs Today's date: 07/10/2017 Total lbs lost to date: 11 (Patients must lose 7 lbs in the first 6 months to continue with counseling)   ASK: We discussed the diagnosis of obesity with Tyler [name] today and Tyler [name] agreed to give [place] permission to discuss obesity behavioral modification therapy today.  ASSESS: Tyler [name] has the diagnosis of obesity and his BMI today is 37.44 Tyler [name] is in the action stage of change   ADVISE: Tyler [name] was educated on the multiple health risks of obesity as well as the benefit of weight loss to improve his health. He was advised of the need for long term treatment and the importance of lifestyle modifications.  AGREE: Multiple dietary modification options and treatment options were discussed and  Tyler [name] agreed to follow the Category 2 plan We discussed the following Behavioral Modification Strategies today: increasing lean protein intake and travel eating strategies

## 2017-07-11 LAB — COMPREHENSIVE METABOLIC PANEL
A/G RATIO: 1.9 (ref 1.2–2.2)
ALBUMIN: 4.1 g/dL (ref 3.6–4.8)
ALK PHOS: 57 IU/L (ref 39–117)
ALT: 16 IU/L (ref 0–44)
AST: 16 IU/L (ref 0–40)
BUN / CREAT RATIO: 19 (ref 10–24)
BUN: 20 mg/dL (ref 8–27)
CHLORIDE: 107 mmol/L — AB (ref 96–106)
CO2: 21 mmol/L (ref 20–29)
Calcium: 10.4 mg/dL — ABNORMAL HIGH (ref 8.6–10.2)
Creatinine, Ser: 1.03 mg/dL (ref 0.76–1.27)
GFR calc non Af Amer: 74 mL/min/{1.73_m2} (ref >59)
GFR, EST AFRICAN AMERICAN: 85 mL/min/{1.73_m2} (ref >59)
Globulin, Total: 2.2 g/dL (ref 1.5–4.5)
Glucose: 113 mg/dL — ABNORMAL HIGH (ref 65–99)
POTASSIUM: 4.4 mmol/L (ref 3.5–5.2)
Sodium: 141 mmol/L (ref 134–144)
TOTAL PROTEIN: 6.3 g/dL (ref 6.0–8.5)

## 2017-07-11 LAB — LIPID PANEL WITH LDL/HDL RATIO
Cholesterol, Total: 183 mg/dL (ref 100–199)
HDL: 58 mg/dL (ref >39)
LDL Calculated: 106 mg/dL — ABNORMAL HIGH (ref 0–99)
LDL/HDL RATIO: 1.8 ratio (ref 0.0–3.6)
TRIGLYCERIDES: 94 mg/dL (ref 0–149)
VLDL Cholesterol Cal: 19 mg/dL (ref 5–40)

## 2017-07-11 LAB — HEMOGLOBIN A1C
Est. average glucose Bld gHb Est-mCnc: 128 mg/dL
Hgb A1c MFr Bld: 6.1 % — ABNORMAL HIGH (ref 4.8–5.6)

## 2017-07-11 LAB — INSULIN, RANDOM: INSULIN: 14 u[IU]/mL (ref 2.6–24.9)

## 2017-07-11 LAB — VITAMIN D 25 HYDROXY (VIT D DEFICIENCY, FRACTURES): VIT D 25 HYDROXY: 52.1 ng/mL (ref 30.0–100.0)

## 2017-07-31 ENCOUNTER — Ambulatory Visit (INDEPENDENT_AMBULATORY_CARE_PROVIDER_SITE_OTHER): Payer: Medicare Other | Admitting: Physician Assistant

## 2017-07-31 VITALS — BP 132/74 | HR 58 | Temp 98.3°F | Ht 65.0 in | Wt 224.0 lb

## 2017-07-31 DIAGNOSIS — R7303 Prediabetes: Secondary | ICD-10-CM

## 2017-07-31 DIAGNOSIS — Z6837 Body mass index (BMI) 37.0-37.9, adult: Secondary | ICD-10-CM

## 2017-07-31 DIAGNOSIS — E559 Vitamin D deficiency, unspecified: Secondary | ICD-10-CM

## 2017-07-31 MED ORDER — METFORMIN HCL 500 MG PO TABS: 500.0000 mg | ORAL_TABLET | Freq: Every morning | ORAL | 0 refills | Status: DC

## 2017-07-31 MED ORDER — VITAMIN D (ERGOCALCIFEROL) 1.25 MG (50000 UNIT) PO CAPS: 50000.0000 [IU] | ORAL_CAPSULE | ORAL | 0 refills | Status: DC

## 2017-07-31 NOTE — Progress Notes (Signed)
Office: [phone]  /  Fax: [phone]   HPI:   Chief Complaint: OBESITY Tyler [name] is here to discuss his progress with his obesity treatment plan. He is on the Category 2 plan and is following his eating plan approximately 90 % of the time. He states he is exercising 0 minutes 0 times per week. Tiler has done well with weight loss despite being on vacation. He managed to control his portions and make smarter food choices and be is mindful of his eating. He states he would like more variety at dinner. His weight is 224 lb (101.6 kg) today and has had a weight loss of 1 pound over a period of 3 weeks since his last visit. He has lost 12 lbs since starting treatment with [place].  Vitamin D deficiency Tyler [name] has a diagnosis of vitamin D deficiency. He is currently taking prescription Vit D and denies nausea, vomiting or muscle weakness.  Pre-Diabetes Tyler [name] has a diagnosis of pre-diabetes based on his elevated HgA1c and was informed this puts him at greater risk of developing diabetes. Tyler [name] A1c improved to 6.1. He is taking metformin currently and continues to work on diet and exercise to decrease risk of diabetes. He denies nausea, hypoglycemia, or polyphagia.  ALLERGIES: No Known Allergies  MEDICATIONS: Current Outpatient Prescriptions on File Prior to Visit  Medication Sig Dispense Refill  . acetaminophen (TYLENOL) 500 MG tablet Take 500 mg by mouth every 6 (six) hours as needed for mild pain.    Marland Kitchen amLODipine (NORVASC) 5 MG tablet Take 5 mg by mouth daily.    . Cholecalciferol (VITAMIN D3) 2000 UNITS TABS Take 2,000 Units by mouth daily.    . CHROMIUM PO Take 1 tablet by mouth every morning.    Marland Kitchen CRESTOR 5 MG tablet Take 5 mg by mouth at bedtime.  1  . dextromethorphan-guaiFENesin (MUCINEX DM) 30-600 MG 12hr tablet Take 1 tablet by mouth 2 (two) times daily as needed for cough.    . docusate sodium (COLACE) 100 MG capsule Take 200 mg by mouth at bedtime as needed for mild constipation.      . multivitamin-iron-minerals-folic acid (CENTRUM) chewable tablet Chew 1 tablet by mouth daily.    . Potassium 99 MG TABS Take 99 mg by mouth daily.    . QNASL 80 MCG/ACT AERS ONE SPRAY EACH NOSTRIL TWICE DAILY FOR STUFFY NOSE OR DRAINAGE. 8.7 Inhaler 0  . triamterene-hydrochlorothiazide (MAXZIDE-25) 37.5-25 MG per tablet Take 0.5 tablets by mouth daily.      No current facility-administered medications on file prior to visit.     PAST MEDICAL HISTORY: Past Medical History:  Diagnosis Date  . Back pain   . Heartburn   . Hyperlipidemia   . Hypertension   . Insulin resistance   . Obesity     PAST SURGICAL HISTORY: Past Surgical History:  Procedure Laterality Date  . BACK SURGERY     03/14/2004  . EYE SURGERY     bil cataract  02/2013  . LUMBAR LAMINECTOMY/DECOMPRESSION MICRODISCECTOMY N/A 05/19/2015   Procedure: Lumbar four- five diskectomy;  Surgeon: Coletta Memos, MD;  Location: [facility];  Service: Neurosurgery;  Laterality: N/A;  L45 diskectomy    SOCIAL HISTORY: Social History  Substance Use Topics  . Smoking status: Former Games developer  . Smokeless tobacco: Never Used     Comment: quit smokling in '70s  . Alcohol use 2.4 oz/week    4 Shots of liquor per week     Comment: social  FAMILY HISTORY: Family History  Problem Relation Age of Onset  . Hypertension Father   . Hyperlipidemia Father   . Heart disease Father   . Obesity Father     ROS: Review of Systems  Constitutional: Positive for weight loss.  Gastrointestinal: Negative for nausea and vomiting.  Endo/Heme/Allergies:       Negative muscle weakness Negative hypoglycemia Negative polyphagia    PHYSICAL EXAM: Blood pressure 132/74, pulse (!) 58, temperature 98.3 F (36.8 C), temperature source Oral, height  (1.651 m), weight 224 lb (101.6 kg), SpO2 98 %.f Body mass index is 37.28 kg/m. Physical Exam  Constitutional: He is oriented to person, place, and time. He appears well-developed and  well-nourished.  Cardiovascular:  Bradycardic  Pulmonary/Chest: Effort normal.  Musculoskeletal: Normal range of motion.  Neurological: He is oriented to person, place, and time.  Skin: Skin is warm and dry.  Psychiatric: He has a normal mood and affect. His behavior is normal.  Vitals reviewed.   RECENT LABS AND TESTS: BMET    Component Value Date/Time   NA 141 07/10/2017 0827   K 4.4 07/10/2017 0827   CL 107 (H) 07/10/2017 0827   CO2 21 07/10/2017 0827   GLUCOSE 113 (H) 07/10/2017 0827   GLUCOSE 112 (H) 08/25/2015 1342   BUN 20 07/10/2017 0827   CREATININE 1.03 07/10/2017 0827   CALCIUM 10.4 (H) 07/10/2017 0827   GFRNONAA 74 07/10/2017 0827   GFRAA 85 07/10/2017 0827   Lab Results  Component Value Date   HGBA1C 6.1 (H) 07/10/2017   HGBA1C 6.4 (H) 04/01/2017   Lab Results  Component Value Date   INSULIN 14.0 07/10/2017   INSULIN 14.6 04/01/2017   CBC    Component Value Date/Time   WBC 5.4 04/01/2017 1010   WBC 6.6 08/25/2015 1342   RBC 4.82 04/01/2017 1010   RBC 4.69 08/25/2015 1342   HGB 14.5 04/01/2017 1010   HCT 42.7 04/01/2017 1010   PLT 271 08/25/2015 1342   MCV 89 04/01/2017 1010   MCH 30.1 04/01/2017 1010   MCH 29.9 08/25/2015 1342   MCHC 34.0 04/01/2017 1010   MCHC 33.2 08/25/2015 1342   RDW 14.1 04/01/2017 1010   LYMPHSABS 2.4 04/01/2017 1010   EOSABS 0.1 04/01/2017 1010   BASOSABS 0.0 04/01/2017 1010   Iron/TIBC/Ferritin/ %Sat No results found for: IRON, TIBC, FERRITIN, IRONPCTSAT Lipid Panel     Component Value Date/Time   CHOL 183 07/10/2017 0827   TRIG 94 07/10/2017 0827   HDL 58 07/10/2017 0827   LDLCALC 106 (H) 07/10/2017 0827   Hepatic Function Panel     Component Value Date/Time   PROT 6.3 07/10/2017 0827   ALBUMIN 4.1 07/10/2017 0827   AST 16 07/10/2017 0827   ALT 16 07/10/2017 0827   ALKPHOS 57 07/10/2017 0827   BILITOT <0.2 07/10/2017 0827      Component Value Date/Time   TSH 1.350 04/01/2017 1010    ASSESSMENT AND  PLAN: Vitamin D deficiency - Plan: Vitamin D, Ergocalciferol, (DRISDOL) 50000 units CAPS capsule  Prediabetes - Plan: metFORMIN (GLUCOPHAGE) 500 MG tablet  Class 2 severe obesity with serious comorbidity and body mass index (BMI) of 37.0 to 37.9 in adult, unspecified obesity type (HCC)  PLAN:  Vitamin D Deficiency Baruch was informed that low vitamin D levels contributes to fatigue and are associated with obesity, breast, and colon cancer. Geffrey agrees to continue taking prescription Vit D ,000 IU #4 every week and we will refill for one  month. He will follow up for routine testing of vitamin D, at least 2-3 times per year. He was informed of the risk of over-replacement of vitamin D and agrees to not increase his dose unless he discusses this with [place] first. Shyheim agrees to follow up with our clinic in 4 weeks.  Pre-Diabetes Naszir will continue to work on weight loss, exercise, and decreasing simple carbohydrates in his diet to help decrease the risk of diabetes. We dicussed metformin including benefits and risks. He was informed that eating too many simple carbohydrates or too many calories at one sitting increases the likelihood of GI side effects. Jaquavian agrees to continue taking metformin 500 mg #30 and we will refill for one month. Arrie agrees to follow up with our clinic in 4 weeks as directed to monitor his progress.   Obesity Tobechukwu is currently in the action stage of change. As such, his goal is to continue with weight loss efforts He has agreed to change to keep a food journal with 500 calories and 40 grams of protein at supper and follow the Category 2 plan Gresham has been instructed to work up to a goal of 150 minutes of combined cardio and strengthening exercise per week for weight loss and overall health benefits. We discussed the following Behavioral Modification Strategies today: increasing lean protein intake and keep a strict food journal   Jermy has agreed to follow up with  our clinic in 4 weeks. He was informed of the importance of frequent follow up visits to maximize his success with intensive lifestyle modifications for his multiple health conditions.  I, Burt Knack, am acting as transcriptionist for Illa Level, PA-C  I have reviewed the above documentation for accuracy and completeness, and I agree with the above. -Illa Level, PA-C  I have reviewed the above note and agree with the plan. -Quillian Quince, MD     Today's visit was # 9 out of 22.  Starting weight: 236 lbs Starting date: 04/01/17 Today's weight : 224 lbs Today's date: 07/31/2017 Total lbs lost to date: 12 (Patients must lose 7 lbs in the first 6 months to continue with counseling)   ASK: We discussed the diagnosis of obesity with Rudy Jew Befort today and Dezmin agreed to give [place] permission to discuss obesity behavioral modification therapy today.  ASSESS: Wilbur has the diagnosis of obesity and his BMI today is 44 Quinten is in the action stage of change   ADVISE: Rondel was educated on the multiple health risks of obesity as well as the benefit of weight loss to improve his health. He was advised of the need for long term treatment and the importance of lifestyle modifications.  AGREE: Multiple dietary modification options and treatment options were discussed and Mehtaab agreed to keep a food journal with 500 calories and 40 grams of protein at supper and follow the Category 2 plan We discussed the following Behavioral Modification Strategies today: increasing lean protein intake and keeping a strict food journal

## 2017-08-08 ENCOUNTER — Ambulatory Visit (INDEPENDENT_AMBULATORY_CARE_PROVIDER_SITE_OTHER): Payer: Medicare Other

## 2017-08-08 DIAGNOSIS — J309 Allergic rhinitis, unspecified: Secondary | ICD-10-CM

## 2017-08-11 ENCOUNTER — Ambulatory Visit (INDEPENDENT_AMBULATORY_CARE_PROVIDER_SITE_OTHER): Payer: Medicare Other | Admitting: Dietician

## 2017-08-11 VITALS — Ht 65.0 in | Wt 226.0 lb

## 2017-08-11 DIAGNOSIS — Z6837 Body mass index (BMI) 37.0-37.9, adult: Secondary | ICD-10-CM

## 2017-08-11 DIAGNOSIS — R7303 Prediabetes: Secondary | ICD-10-CM

## 2017-08-11 NOTE — Progress Notes (Signed)
  Office: [phone]  /  Fax: [phone]     Tyler [name] has a diagnosis of prediabetes based on his elevated HgA1c and was informed this puts him at greater risk of developing diabetes. Tyler [name] is here today for diabetes prevention counseling which includes his obesity treatment plan.  Tyler [name] followed his Category 2 meal plan approximately 90% of the time and has gained 1 lb since his last visit. He is retaining some fluid. He has lost 10 lbs since starting treatment with us.  Discussed vacation and celebration heathy eating strategies. He is deviating some from his meal plan but is making healthier choices. Tyler [name] and his wife are interested in some more flexibility with their meal plan. Began education on journaling food intake on the My Fitness Pal app.    Patient was educated about food nutrients ie protein, fats, simple and complex carbohydrates and how these affect insulin response. Focus on portion control,  avoiding simple carbohydrates and lower fat foods for ongoing wt loss efforts and glucose management.  Tyler [name] is on the following meal plan Category 2. His meal plan was individualized for maximum benefit.  Also discussed at length the following behavioral modifications to help maximize success increasing lean protein intake, avoiding fried foods, meal planning and food prep were also discussed. Increase water intake was also reviewed.    Tyler [name] has been instructed to work up to a goal of 150 minutes of combined cardio and strengthening exercise per week for weight loss and overall health benefits.    Office: [phone]  /  Fax: [phone]  OBESITY BEHAVIORAL INTERVENTION VISIT  Today's visit was # 10 out of 22.  Starting weight: 236 lbs Starting date: 04/01/17 Today's weight : Weight: 226 lb (102.5 kg)  Today's date: 08/11/2017 Total lbs lost to date: 10  (Patients must lose 7 lbs in the first 6 months to continue with counseling)   ASK: We discussed the diagnosis of obesity  with Tyler [name] today and Tyler [name] agreed to give us permission to discuss obesity behavioral modification therapy today.  ASSESS: Tyler [name] has the diagnosis of obesity and his BMI today is 37.61 Tyler [name] is in the action stage of change   ADVISE: Tyler [name] was educated on the multiple health risks of obesity as well as the benefit of weight loss to improve his health. He was advised of the need for long term treatment and the importance of lifestyle modifications.  AGREE: Multiple dietary modification options and treatment options were discussed and  Tyler [name] agreed to follow the Category 2 plan We discussed the following Behavioral Modification Stratagies today: increasing lean protein intake and work on meal planning and easy cooking plans, and increasing fluid intake.

## 2017-08-25 ENCOUNTER — Ambulatory Visit (INDEPENDENT_AMBULATORY_CARE_PROVIDER_SITE_OTHER): Payer: Medicare Other | Admitting: Physician Assistant

## 2017-08-25 VITALS — BP 139/82 | HR 58 | Temp 98.4°F | Ht 65.0 in | Wt 226.0 lb

## 2017-08-25 DIAGNOSIS — R7303 Prediabetes: Secondary | ICD-10-CM | POA: Diagnosis not present

## 2017-08-25 DIAGNOSIS — Z6837 Body mass index (BMI) 37.0-37.9, adult: Secondary | ICD-10-CM

## 2017-08-25 DIAGNOSIS — E559 Vitamin D deficiency, unspecified: Secondary | ICD-10-CM | POA: Diagnosis not present

## 2017-08-25 MED ORDER — METFORMIN HCL 500 MG PO TABS: 500.0000 mg | ORAL_TABLET | Freq: Every morning | ORAL | 0 refills | Status: DC

## 2017-08-25 MED ORDER — VITAMIN D (ERGOCALCIFEROL) 1.25 MG (50000 UNIT) PO CAPS: 50000.0000 [IU] | ORAL_CAPSULE | ORAL | 0 refills | Status: DC

## 2017-08-25 NOTE — Progress Notes (Signed)
Office: [phone]  /  Fax: [phone]   HPI:   Chief Complaint: OBESITY Tyler [name] is here to discuss his progress with his obesity treatment plan. He is on the Category 2 plan and is following his eating plan approximately 80 % of the time. He states he is walking for 20 minutes 2 to 3 times per week. Tyler [name] maintained his weight. He states he had increased celebration eating over the weekend. He is motivated to get back on track and continue weight loss. His weight is 226 lb (102.5 kg) today and has maintained weight over a period of 3 weeks since his last visit. He has lost 10 lbs since starting treatment with [place].  Pre-Diabetes Tyler [name] has a diagnosis of pre-diabetes based on his elevated Hgb A1c and was informed this puts him at greater risk of developing diabetes. He is taking metformin currently and continues to work on diet and exercise to decrease risk of diabetes. He denies nausea, polyphagia or hypoglycemia.  Vitamin D deficiency Tyler [name] has a diagnosis of vitamin D deficiency. He is currently taking vit D and denies nausea, vomiting or muscle weakness.  ALLERGIES: No Known Allergies  MEDICATIONS: Current Outpatient Medications on File Prior to Visit  Medication Sig Dispense Refill  . acetaminophen (TYLENOL) 500 MG tablet Take 500 mg by mouth every 6 (six) hours as needed for mild pain.    Marland Kitchen amLODipine (NORVASC) 5 MG tablet Take 5 mg by mouth daily.    . Cholecalciferol (VITAMIN D3) 2000 UNITS TABS Take 2,000 Units by mouth daily.    . CHROMIUM PO Take 1 tablet by mouth every morning.    Marland Kitchen CRESTOR 5 MG tablet Take 5 mg by mouth at bedtime.  1  . dextromethorphan-guaiFENesin (MUCINEX DM) 30-600 MG 12hr tablet Take 1 tablet by mouth 2 (two) times daily as needed for cough.    . docusate sodium (COLACE) 100 MG capsule Take 200 mg by mouth at bedtime as needed for mild constipation.    . multivitamin-iron-minerals-folic acid (CENTRUM) chewable tablet Chew 1 tablet by mouth daily.      . Potassium 99 MG TABS Take 99 mg by mouth daily.    . QNASL 80 MCG/ACT AERS ONE SPRAY EACH NOSTRIL TWICE DAILY FOR STUFFY NOSE OR DRAINAGE. 8.7 Inhaler 0  . triamterene-hydrochlorothiazide (MAXZIDE-25) 37.5-25 MG per tablet Take 0.5 tablets by mouth daily.      No current facility-administered medications on file prior to visit.     PAST MEDICAL HISTORY: Past Medical History:  Diagnosis Date  . Back pain   . Heartburn   . Hyperlipidemia   . Hypertension   . Insulin resistance   . Obesity     PAST SURGICAL HISTORY: Past Surgical History:  Procedure Laterality Date  . BACK SURGERY     03/14/2004  . EYE SURGERY     bil cataract  02/2013    SOCIAL HISTORY: Social History   Tobacco Use  . Smoking status: Former Games developer  . Smokeless tobacco: Never Used  . Tobacco comment: quit smokling in '70s  Substance Use Topics  . Alcohol use: Yes    Alcohol/week: 2.4 oz    Types: 4 Shots of liquor per week    Comment: social   . Drug use: No    FAMILY HISTORY: Family History  Problem Relation Age of Onset  . Hypertension Father   . Hyperlipidemia Father   . Heart disease Father   . Obesity Father     ROS: Review  of Systems  Constitutional: Negative for weight loss.  Gastrointestinal: Negative for nausea and vomiting.  Genitourinary: Negative for frequency.  Musculoskeletal:       Negative muscle weakness  Endo/Heme/Allergies: Negative for polydipsia.       Negative polyphagia Negative hypoglycemia    PHYSICAL EXAM: Blood pressure 139/82, pulse (!) 58, temperature 98.4 F (36.9 C), temperature source Oral, height 5\' 5"  (1.651 m), weight 226 lb (102.5 kg), SpO2 98 %. Body mass index is 37.61 kg/m. Physical Exam  Constitutional: He is oriented to person, place, and time. He appears well-developed and well-nourished.  Cardiovascular:  Bradycardic  Pulmonary/Chest: Effort normal.  Musculoskeletal: Normal range of motion.  Neurological: He is oriented to person,  place, and time.  Skin: Skin is warm and dry.  Psychiatric: He has a normal mood and affect. His behavior is normal.  Vitals reviewed.   RECENT LABS AND TESTS: BMET    Component Value Date/Time   NA 141 07/10/2017 0827   K 4.4 07/10/2017 0827   CL 107 (H) 07/10/2017 0827   CO2 21 07/10/2017 0827   GLUCOSE 113 (H) 07/10/2017 0827   GLUCOSE 112 (H) 08/25/2015 1342   BUN 20 07/10/2017 0827   CREATININE 1.03 07/10/2017 0827   CALCIUM 10.4 (H) 07/10/2017 0827   GFRNONAA 74 07/10/2017 0827   GFRAA 85 07/10/2017 0827   Lab Results  Component Value Date   HGBA1C 6.1 (H) 07/10/2017   HGBA1C 6.4 (H) 04/01/2017   Lab Results  Component Value Date   INSULIN 14.0 07/10/2017   INSULIN 14.6 04/01/2017   CBC    Component Value Date/Time   WBC 5.4 04/01/2017 1010   WBC 6.6 08/25/2015 1342   RBC 4.82 04/01/2017 1010   RBC 4.69 08/25/2015 1342   HGB 14.5 04/01/2017 1010   HCT 42.7 04/01/2017 1010   PLT 271 08/25/2015 1342   MCV 89 04/01/2017 1010   MCH 30.1 04/01/2017 1010   MCH 29.9 08/25/2015 1342   MCHC 34.0 04/01/2017 1010   MCHC 33.2 08/25/2015 1342   RDW 14.1 04/01/2017 1010   LYMPHSABS 2.4 04/01/2017 1010   EOSABS 0.1 04/01/2017 1010   BASOSABS 0.0 04/01/2017 1010   Iron/TIBC/Ferritin/ %Sat No results found for: IRON, TIBC, FERRITIN, IRONPCTSAT Lipid Panel     Component Value Date/Time   CHOL 183 07/10/2017 0827   TRIG 94 07/10/2017 0827   HDL 58 07/10/2017 0827   LDLCALC 106 (H) 07/10/2017 0827   Hepatic Function Panel     Component Value Date/Time   PROT 6.3 07/10/2017 0827   ALBUMIN 4.1 07/10/2017 0827   AST 16 07/10/2017 0827   ALT 16 07/10/2017 0827   ALKPHOS 57 07/10/2017 0827   BILITOT <0.2 07/10/2017 0827      Component Value Date/Time   TSH 1.350 04/01/2017 1010    ASSESSMENT AND PLAN: Prediabetes - Plan: metFORMIN (GLUCOPHAGE) 500 MG tablet  Vitamin D deficiency - Plan: Vitamin D, Ergocalciferol, (DRISDOL) 50000 units CAPS capsule  Class  2 severe obesity with serious comorbidity and body mass index (BMI) of 37.0 to 37.9 in adult, unspecified obesity type (HCC)  PLAN:  Pre-Diabetes Tyler [name] will continue to work on weight loss, exercise, and decreasing simple carbohydrates in his diet to help decrease the risk of diabetes. We dicussed metformin including benefits and risks. He was informed that eating too many simple carbohydrates or too many calories at one sitting increases the likelihood of GI side effects. Tyler [name] requested metformin for now and a prescription  was written today for 1 month refill. Tyler [name] agreed to follow up with us as directed to monitor his progress.  Vitamin D Deficiency Tyler [name] was informed that low vitamin D levels contributes to fatigue and are associated with obesity, breast, and colon cancer. He agrees to continue to take prescription Vit D @50 ,000 IU every week #4 with no refills and will follow up for routine testing of vitamin D, at least 2-3 times per year. He was informed of the risk of over-replacement of vitamin D and agrees to not increase his dose unless he discusses this with us first. Tyler [name] agrees to follow up with our clinic in 2 weeks.  Obesity Tyler [name] is currently in the action stage of change. As such, his goal is to continue with weight loss efforts He has agreed to keep a food journal with 500 calories and 40 grams of protein at supper daily and follow the Category 2 plan Tyler [name] has been instructed to work up to a goal of 150 minutes of combined cardio and strengthening exercise per week for weight loss and overall health benefits. We discussed the following Behavioral Modification Strategies today: increasing lean protein intake and work on meal planning and easy cooking plans  Tyler [name] has agreed to follow up with our clinic in 2 weeks. He was informed of the importance of frequent follow up visits to maximize his success with intensive lifestyle modifications for his multiple health conditions.  I,  [place] CraneJoanne Murray, am acting as transcriptionist for Illa LevelSahar Morrill Bomkamp, PA-C  I have reviewed the above documentation for accuracy and completeness, and I agree with the above. -Illa LevelSahar Lakeitha Basques, PA-C  I have reviewed the above note and agree with the plan. -Quillian Quincearen Beasley, MD   OBESITY BEHAVIORAL INTERVENTION VISIT  Today's visit was # 10 out of 22.  Starting weight: 236 lbs Starting date: 04/01/17 Today's weight : 226 lbs Today's date: 08/26/2017 Total lbs lost to date: 10 (Patients must lose 7 lbs in the first 6 months to continue with counseling)   ASK: We discussed the diagnosis of obesity with Tyler [name] today and Tyler [name] agreed to give us permission to discuss obesity behavioral modification therapy today.  ASSESS: Tyler [name] has the diagnosis of obesity and his BMI today is 37.61 Tyler [name] is in the action stage of change   ADVISE: Tyler [name] was educated on the multiple health risks of obesity as well as the benefit of weight loss to improve his health. He was advised of the need for long term treatment and the importance of lifestyle modifications.  AGREE: Multiple dietary modification options and treatment options were discussed and  Tyler [name] agreed to keep a food journal with 500 calories and 40 grams of protein at supper daily and follow the Category 2 plan We discussed the following Behavioral Modification Strategies today: increasing lean protein intake and work on meal planning and easy cooking plans

## 2017-08-29 ENCOUNTER — Ambulatory Visit (INDEPENDENT_AMBULATORY_CARE_PROVIDER_SITE_OTHER): Payer: Medicare Other

## 2017-08-29 DIAGNOSIS — J309 Allergic rhinitis, unspecified: Secondary | ICD-10-CM

## 2017-09-08 ENCOUNTER — Ambulatory Visit (INDEPENDENT_AMBULATORY_CARE_PROVIDER_SITE_OTHER): Payer: Medicare Other | Admitting: Physician Assistant

## 2017-09-08 VITALS — BP 159/75 | HR 54 | Temp 98.3°F | Ht 65.0 in | Wt 225.0 lb

## 2017-09-08 DIAGNOSIS — G4709 Other insomnia: Secondary | ICD-10-CM | POA: Diagnosis not present

## 2017-09-08 DIAGNOSIS — Z6837 Body mass index (BMI) 37.0-37.9, adult: Secondary | ICD-10-CM

## 2017-09-08 DIAGNOSIS — I10 Essential (primary) hypertension: Secondary | ICD-10-CM | POA: Diagnosis not present

## 2017-09-08 MED ORDER — MELATONIN 10 MG PO CAPS: 1.0000 | ORAL_CAPSULE | ORAL | 0 refills | Status: DC | PRN

## 2017-09-08 NOTE — Progress Notes (Signed)
Office: [phone]  /  Fax: [phone]   HPI:   Chief Complaint: OBESITY Tyler [name] is here to discuss his progress with his obesity treatment plan. He is on the Category 2 plan and is following his eating plan approximately 95 % of the time. He states he is exercising 0 minutes 0 times per week. Tyler [name] continues to do well with weight loss. He would like to incorporate more variety into his meals. Also, he would like holiday eating strategies. His weight is 225 lb (102.1 kg) today and has had a weight loss of 1 pound over a period of 2 weeks since his last visit. He has lost 11 lbs since starting treatment with us.  Hypertension Tyler [name] is a 71 y.o. male with hypertension. His blood pressure is elevated today at 159/75 and he states his blood pressure at home is stable, but he has not slept well last night. Tyler [name] denies chest pain or shortness of breath on exertion. He is working weight loss to help control his blood pressure with the goal of decreasing his risk of heart attack and stroke. Tyler [name] blood pressure is not currently controlled.  Insomnia Tyler [name] complains of occasional insomnia where he has a harder time falling asleep. He states most nights he doesn't have any trouble sleeping or staying asleep. He denies any known snoring, apneic episodes at night, waking up in the morning unrested, waking up in the morning with a headache, or any daytime somnolence. He denies any history of sleep apnea.   ALLERGIES: No Known Allergies  MEDICATIONS: Current Outpatient Medications on File Prior to Visit  Medication Sig Dispense Refill  . acetaminophen (TYLENOL) 500 MG tablet Take 500 mg by mouth every 6 (six) hours as needed for mild pain.    Marland Kitchen. amLODipine (NORVASC) 5 MG tablet Take 5 mg by mouth daily.    . Cholecalciferol (VITAMIN D3) 2000 UNITS TABS Take 2,000 Units by mouth daily.    . CHROMIUM PO Take 1 tablet by mouth every morning.    Marland Kitchen. CRESTOR 5 MG tablet  Take 5 mg by mouth at bedtime.  1  . dextromethorphan-guaiFENesin (MUCINEX DM) 30-600 MG 12hr tablet Take 1 tablet by mouth 2 (two) times daily as needed for cough.    . docusate sodium (COLACE) 100 MG capsule Take 200 mg by mouth at bedtime as needed for mild constipation.    . metFORMIN (GLUCOPHAGE) 500 MG tablet Take 1 tablet (500 mg total) every morning by mouth. 30 tablet 0  . multivitamin-iron-minerals-folic acid (CENTRUM) chewable tablet Chew 1 tablet by mouth daily.    . Potassium 99 MG TABS Take 99 mg by mouth daily.    . QNASL 80 MCG/ACT AERS ONE SPRAY EACH NOSTRIL TWICE DAILY FOR STUFFY NOSE OR DRAINAGE. 8.7 Inhaler 0  . triamterene-hydrochlorothiazide (MAXZIDE-25) 37.5-25 MG per tablet Take 0.5 tablets by mouth daily.     . Vitamin D, Ergocalciferol, (DRISDOL) 50000 units CAPS capsule Take 1 capsule (50,000 Units total) every 7 (seven) days by mouth. 4 capsule 0   No current facility-administered medications on file prior to visit.     PAST MEDICAL HISTORY: Past Medical History:  Diagnosis Date  . Back pain   . Heartburn   . Hyperlipidemia   . Hypertension   . Insulin resistance   . Obesity     PAST SURGICAL HISTORY: Past Surgical History:  Procedure Laterality Date  . BACK SURGERY     03/14/2004  . EYE SURGERY  bil cataract  02/2013  . Lumbar four- five diskectomy N/A 05/19/2015   [facility]ell[facility] Kyle[facility]  . Lumbar four- Lumbar five posterior lumbar interbody fusion with interbody prosthesis posterior lateral arthrodesis and posterior segmental instrumentation N/A 08/30/2015   [facility]ell[facility]    SOCIAL HISTORY: Social History   Tobacco Use  . Smoking status: Former Games developermoker  . Smokeless tobacco: Never Used  . Tobacco comment: quit smokling in '70s  Substance Use Topics  . Alcohol use: Yes    Alcohol/week: 2.4 oz    Types: 4 Shots of liquor per week    Comment: social   . Drug use: No    FAMILY  HISTORY: Family History  Problem Relation Age of Onset  . Hypertension Father   . Hyperlipidemia Father   . Heart disease Father   . Obesity Father     ROS: Review of Systems  Constitutional: Positive for weight loss.       Negative morning fatigue  Respiratory: Negative for shortness of breath (on exertion).        Negative snoring, Negative apneic episodes.  Cardiovascular: Negative for chest pain.  Neurological: Negative for headaches.  Psychiatric/Behavioral: The patient has insomnia.        Negative daytime somnolence    PHYSICAL EXAM: Blood pressure (!) 159/75, pulse (!) 54, temperature 98.3 F (36.8 C), temperature source Oral, height 5\' 5"  (1.651 m), weight 225 lb (102.1 kg), SpO2 100 %. Body mass index is 37.44 kg/m. Physical Exam  Constitutional: He is oriented to person, place, and time. He appears well-developed and well-nourished.  Cardiovascular:  Bradycardic  Pulmonary/Chest: Effort normal.  Musculoskeletal: Normal range of motion.  Neurological: He is oriented to person, place, and time.  Skin: Skin is warm and dry.  Psychiatric: He has a normal mood and affect. His behavior is normal.  Vitals reviewed.   RECENT LABS AND TESTS: BMET    Component Value Date/Time   NA 141 07/10/2017 0827   K 4.4 07/10/2017 0827   CL 107 (H) 07/10/2017 0827   CO2 21 07/10/2017 0827   GLUCOSE 113 (H) 07/10/2017 0827   GLUCOSE 112 (H) 08/25/2015 1342   BUN 20 07/10/2017 0827   CREATININE 1.03 07/10/2017 0827   CALCIUM 10.4 (H) 07/10/2017 0827   GFRNONAA 74 07/10/2017 0827   GFRAA 85 07/10/2017 0827   Lab Results  Component Value Date   HGBA1C 6.1 (H) 07/10/2017   HGBA1C 6.4 (H) 04/01/2017   Lab Results  Component Value Date   INSULIN 14.0 07/10/2017   INSULIN 14.6 04/01/2017   CBC    Component Value Date/Time   WBC 5.4 04/01/2017 1010   WBC 6.6 08/25/2015 1342   RBC 4.82 04/01/2017 1010   RBC 4.69 08/25/2015 1342   HGB 14.5 04/01/2017 1010   HCT 42.7  04/01/2017 1010   PLT 271 08/25/2015 1342   MCV 89 04/01/2017 1010   MCH 30.1 04/01/2017 1010   MCH 29.9 08/25/2015 1342   MCHC 34.0 04/01/2017 1010   MCHC 33.2 08/25/2015 1342   RDW 14.1 04/01/2017 1010   LYMPHSABS 2.4 04/01/2017 1010   EOSABS 0.1 04/01/2017 1010   BASOSABS 0.0 04/01/2017 1010   Iron/TIBC/Ferritin/ %Sat No results found for: IRON, TIBC, FERRITIN, IRONPCTSAT Lipid Panel     Component Value Date/Time   CHOL 183 07/10/2017 0827   TRIG 94 07/10/2017 0827   HDL 58 07/10/2017 0827   LDLCALC  106 (H) 07/10/2017 0827   Hepatic Function Panel     Component Value Date/Time   PROT 6.3 07/10/2017 0827   ALBUMIN 4.1 07/10/2017 0827   AST 16 07/10/2017 0827   ALT 16 07/10/2017 0827   ALKPHOS 57 07/10/2017 0827   BILITOT <0.2 07/10/2017 0827      Component Value Date/Time   TSH 1.350 04/01/2017 1010    ASSESSMENT AND PLAN: Other insomnia - Plan: Melatonin 10 MG CAPS  Essential hypertension  Class 2 severe obesity with serious comorbidity and body mass index (BMI) of 37.0 to 37.9 in adult, unspecified obesity type (HCC)  PLAN:  Hypertension We discussed sodium restriction, working on healthy weight loss, and a regular exercise program as the means to achieve improved blood pressure control. Tyler [name] agreed with this plan and agreed to follow up as directed. We will continue to monitor his blood pressure as well as his progress with the above lifestyle modifications. He will continue his medications as prescribed and will watch for signs of hypotension as he continues his lifestyle modifications.  Insomnia The problem of occasional insomnia is discussed. Avoidance of caffeine sources is strongly encouraged. Sleep hygiene issues are reviewed. Tyler [name] agrees to start melatonin 10 mg as needed for insomnia and to follow up with our clinic in 2 weeks.  Obesity Tyler [name] is currently in the action stage of change. As such, his goal is to continue with weight loss efforts He  has agreed to keep a food journal with 1200 calories and 85 grams of protein daily Tyler [name] has been instructed to work up to a goal of 150 minutes of combined cardio and strengthening exercise per week for weight loss and overall health benefits. We discussed the following Behavioral Modification Strategies today: increasing lean protein intake and holiday eating strategies   Tyler [name] has agreed to follow up with our clinic in 2 weeks. He was informed of the importance of frequent follow up visits to maximize his success with intensive lifestyle modifications for his multiple health conditions.  I, [place] Crane, am acting as transcriptionist for Illa Level, PA-C  I have reviewed the above documentation for accuracy and completeness, and I agree with the above. -Illa Level, PA-C  I have reviewed the above note and agree with the plan. -Tyler Quince, MD   OBESITY BEHAVIORAL INTERVENTION VISIT  Today's visit was # 11 out of 22.  Starting weight: 236 lbs Starting date: 04/01/17 Today's weight : 225 lbs Today's date: 09/09/2017 Total lbs lost to date: 13 (Patients must lose 7 lbs in the first 6 months to continue with counseling)   ASK: We discussed the diagnosis of obesity with Tyler Jew [name] today and Tyler [name] agreed to give [place] permission to discuss obesity behavioral modification therapy today.  ASSESS: Jhayden has the diagnosis of obesity and his BMI today is 37.44 Nixxon is in the action stage of change   ADVISE: Christobal was educated on the multiple health risks of obesity as well as the benefit of weight loss to improve his health. He was advised of the need for long term treatment and the importance of lifestyle modifications.  AGREE: Multiple dietary modification options and treatment options were discussed and  Marquelle agreed to keep a food journal with 1200 calories and 85 grams of protein daily We discussed the following Behavioral Modification Strategies today: increasing lean  protein intake and holiday eating strategies

## 2017-09-19 ENCOUNTER — Ambulatory Visit (INDEPENDENT_AMBULATORY_CARE_PROVIDER_SITE_OTHER): Payer: Medicare Other | Admitting: *Deleted

## 2017-09-19 DIAGNOSIS — J309 Allergic rhinitis, unspecified: Secondary | ICD-10-CM | POA: Diagnosis not present

## 2017-09-23 ENCOUNTER — Ambulatory Visit (INDEPENDENT_AMBULATORY_CARE_PROVIDER_SITE_OTHER): Payer: Medicare Other | Admitting: Physician Assistant

## 2017-09-23 VITALS — BP 126/81 | HR 60 | Temp 98.1°F | Ht 65.0 in | Wt 228.0 lb

## 2017-09-23 DIAGNOSIS — E559 Vitamin D deficiency, unspecified: Secondary | ICD-10-CM

## 2017-09-23 DIAGNOSIS — Z6838 Body mass index (BMI) 38.0-38.9, adult: Secondary | ICD-10-CM

## 2017-09-23 DIAGNOSIS — R7303 Prediabetes: Secondary | ICD-10-CM

## 2017-09-23 MED ORDER — METFORMIN HCL 500 MG PO TABS: 500.0000 mg | ORAL_TABLET | Freq: Every morning | ORAL | 0 refills | Status: DC

## 2017-09-23 MED ORDER — VITAMIN D (ERGOCALCIFEROL) 1.25 MG (50000 UNIT) PO CAPS: 50000.0000 [IU] | ORAL_CAPSULE | ORAL | 0 refills | Status: DC

## 2017-09-23 NOTE — Progress Notes (Signed)
Office: [phone]  /  Fax: [phone]   HPI:   Chief Complaint: OBESITY Tyler [name] is here to discuss his progress with his obesity treatment plan. He is on the keep a food journal with 1200 calories and 85 grams of protein daily and is following his eating plan approximately 90 % of the time. He states he is exercising 0 minutes 0 times per week. Tyler [name] is mindful of his eating, but he has challenges following his calorie and protein goals, especially after Thanksgiving. Tyler [name] would like to try a different meal plan. His weight is 228 lb (103.4 kg) today and has had a weight gain of 3 pounds over a period of 2 weeks since his last visit. He has lost 8 lbs since starting treatment with us.  Pre-Diabetes Tyler [name] has a diagnosis of pre-diabetes based on his elevated Hgb A1c and was informed this puts him at greater risk of developing diabetes. He is taking metformin currently and continues to work on diet and exercise to decrease risk of diabetes. He denies nausea, polyphagia or hypoglycemia.  Vitamin D deficiency Tyler [name] has a diagnosis of vitamin D deficiency. He is currently taking vit D and denies nausea, vomiting or muscle weakness.  ALLERGIES: No Known Allergies  MEDICATIONS: Current Outpatient Medications on File Prior to Visit  Medication Sig Dispense Refill  . acetaminophen (TYLENOL) 500 MG tablet Take 500 mg by mouth every 6 (six) hours as needed for mild pain.    Marland Kitchen. amLODipine (NORVASC) 5 MG tablet Take 5 mg by mouth daily.    . Cholecalciferol (VITAMIN D3) 2000 UNITS TABS Take 2,000 Units by mouth daily.    . CHROMIUM PO Take 1 tablet by mouth every morning.    Marland Kitchen. CRESTOR 5 MG tablet Take 5 mg by mouth at bedtime.  1  . dextromethorphan-guaiFENesin (MUCINEX DM) 30-600 MG 12hr tablet Take 1 tablet by mouth 2 (two) times daily as needed for cough.    . docusate sodium (COLACE) 100 MG capsule Take 200 mg by mouth at bedtime as needed for mild constipation.    . Melatonin 10 MG CAPS  Take 1 capsule as needed by mouth. 30 capsule 0  . metFORMIN (GLUCOPHAGE) 500 MG tablet Take 1 tablet (500 mg total) every morning by mouth. 30 tablet 0  . multivitamin-iron-minerals-folic acid (CENTRUM) chewable tablet Chew 1 tablet by mouth daily.    . Potassium 99 MG TABS Take 99 mg by mouth daily.    . QNASL 80 MCG/ACT AERS ONE SPRAY EACH NOSTRIL TWICE DAILY FOR STUFFY NOSE OR DRAINAGE. 8.7 Inhaler 0  . triamterene-hydrochlorothiazide (MAXZIDE-25) 37.5-25 MG per tablet Take 0.5 tablets by mouth daily.     . Vitamin D, Ergocalciferol, (DRISDOL) 50000 units CAPS capsule Take 1 capsule (50,000 Units total) every 7 (seven) days by mouth. 4 capsule 0   No current facility-administered medications on file prior to visit.     PAST MEDICAL HISTORY: Past Medical History:  Diagnosis Date  . Back pain   . Heartburn   . Hyperlipidemia   . Hypertension   . Insulin resistance   . Obesity     PAST SURGICAL HISTORY: Past Surgical History:  Procedure Laterality Date  . BACK SURGERY     03/14/2004  . EYE SURGERY     bil cataract  02/2013  . LUMBAR LAMINECTOMY/DECOMPRESSION MICRODISCECTOMY N/A 05/19/2015   Procedure: Lumbar four- five diskectomy;  Surgeon: Coletta MemosKyle Cabbell, MD;  Location: [facility];  Service: Neurosurgery;  Laterality: N/A;  L45  diskectomy    SOCIAL HISTORY: Social History   Tobacco Use  . Smoking status: Former Games developer  . Smokeless tobacco: Never Used  . Tobacco comment: quit smokling in '70s  Substance Use Topics  . Alcohol use: Yes    Alcohol/week: 2.4 oz    Types: 4 Shots of liquor per week    Comment: social   . Drug use: No    FAMILY HISTORY: Family History  Problem Relation Age of Onset  . Hypertension Father   . Hyperlipidemia Father   . Heart disease Father   . Obesity Father     ROS: Review of Systems  Constitutional: Negative for weight loss.  Gastrointestinal: Negative for nausea and vomiting.  Musculoskeletal:       Negative muscle weakness    Endo/Heme/Allergies:       Negative polyphagia Negative hypoglycemia    PHYSICAL EXAM: Blood pressure 126/81, pulse 60, temperature 98.1 F (36.7 C), temperature source Oral, height 5\' 5"  (1.651 m), weight 228 lb (103.4 kg), SpO2 99 %. Body mass index is 37.94 kg/m. Physical Exam  Constitutional: He is oriented to person, place, and time. He appears well-developed and well-nourished.  Cardiovascular: Normal rate.  Pulmonary/Chest: Effort normal.  Musculoskeletal: Normal range of motion.  Neurological: He is oriented to person, place, and time.  Skin: Skin is warm and dry.  Psychiatric: He has a normal mood and affect. His behavior is normal.  Vitals reviewed.   RECENT LABS AND TESTS: BMET    Component Value Date/Time   NA 141 07/10/2017 0827   K 4.4 07/10/2017 0827   CL 107 (H) 07/10/2017 0827   CO2 21 07/10/2017 0827   GLUCOSE 113 (H) 07/10/2017 0827   GLUCOSE 112 (H) 08/25/2015 1342   BUN 20 07/10/2017 0827   CREATININE 1.03 07/10/2017 0827   CALCIUM 10.4 (H) 07/10/2017 0827   GFRNONAA 74 07/10/2017 0827   GFRAA 85 07/10/2017 0827   Lab Results  Component Value Date   HGBA1C 6.1 (H) 07/10/2017   HGBA1C 6.4 (H) 04/01/2017   Lab Results  Component Value Date   INSULIN 14.0 07/10/2017   INSULIN 14.6 04/01/2017   CBC    Component Value Date/Time   WBC 5.4 04/01/2017 1010   WBC 6.6 08/25/2015 1342   RBC 4.82 04/01/2017 1010   RBC 4.69 08/25/2015 1342   HGB 14.5 04/01/2017 1010   HCT 42.7 04/01/2017 1010   PLT 271 08/25/2015 1342   MCV 89 04/01/2017 1010   MCH 30.1 04/01/2017 1010   MCH 29.9 08/25/2015 1342   MCHC 34.0 04/01/2017 1010   MCHC 33.2 08/25/2015 1342   RDW 14.1 04/01/2017 1010   LYMPHSABS 2.4 04/01/2017 1010   EOSABS 0.1 04/01/2017 1010   BASOSABS 0.0 04/01/2017 1010   Iron/TIBC/Ferritin/ %Sat No results found for: IRON, TIBC, FERRITIN, IRONPCTSAT Lipid Panel     Component Value Date/Time   CHOL 183 07/10/2017 0827   TRIG 94  07/10/2017 0827   HDL 58 07/10/2017 0827   LDLCALC 106 (H) 07/10/2017 0827   Hepatic Function Panel     Component Value Date/Time   PROT 6.3 07/10/2017 0827   ALBUMIN 4.1 07/10/2017 0827   AST 16 07/10/2017 0827   ALT 16 07/10/2017 0827   ALKPHOS 57 07/10/2017 0827   BILITOT <0.2 07/10/2017 0827      Component Value Date/Time   TSH 1.350 04/01/2017 1010    ASSESSMENT AND PLAN: Prediabetes - Plan: metFORMIN (GLUCOPHAGE) 500 MG tablet  Vitamin D  deficiency - Plan: Vitamin D, Ergocalciferol, (DRISDOL) 50000 units CAPS capsule  Class 2 severe obesity with serious comorbidity and body mass index (BMI) of 38.0 to 38.9 in adult, unspecified obesity type (HCC)  PLAN:  Pre-Diabetes Tyler [name] will continue to work on weight loss, exercise, and decreasing simple carbohydrates in his diet to help decrease the risk of diabetes. We dicussed metformin including benefits and risks. He was informed that eating too many simple carbohydrates or too many calories at one sitting increases the likelihood of GI side effects. Tyler [name] requested metformin for now and a prescription was written today for 1 month refill. Tyler [name] agreed to follow up with us as directed to monitor his progress.  Vitamin D Deficiency Tyler [name] was informed that low vitamin D levels contributes to fatigue and are associated with obesity, breast, and colon cancer. He agrees to continue to take prescription Vit D @50 ,000 IU every week #2 with no refills and will follow up for routine testing of vitamin D, at least 2-3 times per year. He was informed of the risk of over-replacement of vitamin D and agrees to not increase his dose unless he discusses this with us first. Tyler [name] agrees to follow up with our clinic in 2 weeks.  Obesity Tyler [name] is currently in the action stage of change. As such, his goal is to continue with weight loss efforts He has agreed to follow a lower carbohydrate, vegetable and lean protein rich diet plan Tyler [name] has been  instructed to work up to a goal of 150 minutes of combined cardio and strengthening exercise per week for weight loss and overall health benefits. We discussed the following Behavioral Modification Strategies today: increasing lean protein intake and work on meal planning and easy cooking plans  Tyler [name] has agreed to follow up with our clinic in 2 weeks. He was informed of the importance of frequent follow up visits to maximize his success with intensive lifestyle modifications for his multiple health conditions.  I, [place] CraneJoanne Murray, am acting as transcriptionist for Illa LevelSahar Brandy Zuba, PA-C  I have reviewed the above documentation for accuracy and completeness, and I agree with the above. -Illa LevelSahar Kiing Deakin, PA-C  I have reviewed the above note and agree with the plan. -Quillian Quincearen Beasley, MD   OBESITY BEHAVIORAL INTERVENTION VISIT  Today's visit was # 12 out of 22.  Starting weight: 236 lbs Starting date: 04/01/17 Today's weight : 228 lbs  Today's date: 09/23/2017 Total lbs lost to date: 8 (Patients must lose 7 lbs in the first 6 months to continue with counseling)   ASK: We discussed the diagnosis of obesity with Tyler [name] today and Tyler [name] agreed to give us permission to discuss obesity behavioral modification therapy today.  ASSESS: Tyler [name] has the diagnosis of obesity and his BMI today is 37.94 Tyler [name] is in the action stage of change   ADVISE: Tyler [name] was educated on the multiple health risks of obesity as well as the benefit of weight loss to improve his health. He was advised of the need for long term treatment and the importance of lifestyle modifications.  AGREE: Multiple dietary modification options and treatment options were discussed and  Tyler [name] agreed to follow a lower carbohydrate, vegetable and lean protein rich diet plan We discussed the following Behavioral Modification Strategies today: increasing lean protein intake and work on meal planning and easy cooking plans

## 2017-10-06 ENCOUNTER — Ambulatory Visit (INDEPENDENT_AMBULATORY_CARE_PROVIDER_SITE_OTHER): Payer: Medicare Other | Admitting: Physician Assistant

## 2017-10-06 ENCOUNTER — Encounter (INDEPENDENT_AMBULATORY_CARE_PROVIDER_SITE_OTHER): Payer: Self-pay | Admitting: Physician Assistant

## 2017-10-06 VITALS — BP 155/78 | HR 74 | Temp 97.5°F | Ht 65.0 in | Wt 229.0 lb

## 2017-10-06 DIAGNOSIS — I10 Essential (primary) hypertension: Secondary | ICD-10-CM | POA: Diagnosis not present

## 2017-10-06 DIAGNOSIS — Z6831 Body mass index (BMI) 31.0-31.9, adult: Secondary | ICD-10-CM | POA: Diagnosis not present

## 2017-10-06 DIAGNOSIS — E669 Obesity, unspecified: Secondary | ICD-10-CM

## 2017-10-06 DIAGNOSIS — Z6838 Body mass index (BMI) 38.0-38.9, adult: Secondary | ICD-10-CM

## 2017-10-06 NOTE — Progress Notes (Signed)
Office: [phone]  /  Fax: [phone]   HPI:   Chief Complaint: OBESITY Tyler [name] is here to discuss his progress with his obesity treatment plan. He is on the lower carbohydrate, vegetable and lean protein rich diet plan and is following his eating plan approximately 90 % of the time. He states he is walking for 30 minutes 2 times per week. Tyler [name] is retaining fluids. He states he has not kept up with his water intake and has not taken his diuretic for a few days. He could not sustain on the low carborhydrate meal plan. He would like more variety with his meals.  His weight is 229 lb (103.9 kg) today and has gained 1 pound since his last visit. He has lost 7 lbs since starting treatment with us.  Hypertension Tyler [name] is a 71 y.o. male with hypertension. Tejon's blood pressure is elevated. He states he has missed taking his blood pressure medicine on some occasions. He denies any adjustments to his medicines today. He denies chest pain or shortness of breath. He is working weight loss to help control his blood pressure with the goal of decreasing his risk of heart attack and stroke. Dequan's blood pressure is not currently controlled.  ALLERGIES: No Known Allergies  MEDICATIONS: Current Outpatient Medications on File Prior to Visit  Medication Sig Dispense Refill  . acetaminophen (TYLENOL) 500 MG tablet Take 500 mg by mouth every 6 (six) hours as needed for mild pain.    Marland Kitchen. amLODipine (NORVASC) 5 MG tablet Take 5 mg by mouth daily.    . Cholecalciferol (VITAMIN D3) 2000 UNITS TABS Take 2,000 Units by mouth daily.    . CHROMIUM PO Take 1 tablet by mouth every morning.    Marland Kitchen. CRESTOR 5 MG tablet Take 5 mg by mouth at bedtime.  1  . dextromethorphan-guaiFENesin (MUCINEX DM) 30-600 MG 12hr tablet Take 1 tablet by mouth 2 (two) times daily as needed for cough.    . docusate sodium (COLACE) 100 MG capsule Take 200 mg by mouth at bedtime as needed for mild constipation.    .  Melatonin 10 MG CAPS Take 1 capsule as needed by mouth. 30 capsule 0  . metFORMIN (GLUCOPHAGE) 500 MG tablet Take 1 tablet (500 mg total) by mouth every morning. 30 tablet 0  . multivitamin-iron-minerals-folic acid (CENTRUM) chewable tablet Chew 1 tablet by mouth daily.    . Potassium 99 MG TABS Take 99 mg by mouth daily.    . QNASL 80 MCG/ACT AERS ONE SPRAY EACH NOSTRIL TWICE DAILY FOR STUFFY NOSE OR DRAINAGE. 8.7 Inhaler 0  . triamterene-hydrochlorothiazide (MAXZIDE-25) 37.5-25 MG per tablet Take 0.5 tablets by mouth daily.     . Vitamin D, Ergocalciferol, (DRISDOL) 50000 units CAPS capsule Take 1 capsule (50,000 Units total) by mouth every 7 (seven) days. 2 capsule 0   No current facility-administered medications on file prior to visit.     PAST MEDICAL HISTORY: Past Medical History:  Diagnosis Date  . Back pain   . Heartburn   . Hyperlipidemia   . Hypertension   . Insulin resistance   . Obesity     PAST SURGICAL HISTORY: Past Surgical History:  Procedure Laterality Date  . BACK SURGERY     03/14/2004  . EYE SURGERY     bil cataract  02/2013  . LUMBAR LAMINECTOMY/DECOMPRESSION MICRODISCECTOMY N/A 05/19/2015   Procedure: Lumbar four- five diskectomy;  Surgeon: Coletta MemosKyle Cabbell, MD;  Location: [facility];  Service: Neurosurgery;  Laterality: N/A;  L45 diskectomy    SOCIAL HISTORY: Social History   Tobacco Use  . Smoking status: Former Games developer  . Smokeless tobacco: Never Used  . Tobacco comment: quit smokling in '70s  Substance Use Topics  . Alcohol use: Yes    Alcohol/week: 2.4 oz    Types: 4 Shots of liquor per week    Comment: social   . Drug use: No    FAMILY HISTORY: Family History  Problem Relation Age of Onset  . Hypertension Father   . Hyperlipidemia Father   . Heart disease Father   . Obesity Father     ROS: Review of Systems  Constitutional: Negative for weight loss.  Respiratory: Negative for shortness of breath.   Cardiovascular: Negative for chest  pain.    PHYSICAL EXAM: Blood pressure (!) 155/78, pulse 74, temperature (!) 97.5 F (36.4 C), temperature source Oral, height 5\' 5"  (1.651 m), weight 229 lb (103.9 kg), SpO2 98 %. Body mass index is 38.11 kg/m. Physical Exam  Constitutional: He is oriented to person, place, and time. He appears well-developed and well-nourished.  Cardiovascular: Normal rate.  Pulmonary/Chest: Effort normal.  Musculoskeletal: Normal range of motion.  Neurological: He is oriented to person, place, and time.  Skin: Skin is warm and dry.  Psychiatric: He has a normal mood and affect. His behavior is normal.  Vitals reviewed.   RECENT LABS AND TESTS: BMET    Component Value Date/Time   NA 141 07/10/2017 0827   K 4.4 07/10/2017 0827   CL 107 (H) 07/10/2017 0827   CO2 21 07/10/2017 0827   GLUCOSE 113 (H) 07/10/2017 0827   GLUCOSE 112 (H) 08/25/2015 1342   BUN 20 07/10/2017 0827   CREATININE 1.03 07/10/2017 0827   CALCIUM 10.4 (H) 07/10/2017 0827   GFRNONAA 74 07/10/2017 0827   GFRAA 85 07/10/2017 0827   Lab Results  Component Value Date   HGBA1C 6.1 (H) 07/10/2017   HGBA1C 6.4 (H) 04/01/2017   Lab Results  Component Value Date   INSULIN 14.0 07/10/2017   INSULIN 14.6 04/01/2017   CBC    Component Value Date/Time   WBC 5.4 04/01/2017 1010   WBC 6.6 08/25/2015 1342   RBC 4.82 04/01/2017 1010   RBC 4.69 08/25/2015 1342   HGB 14.5 04/01/2017 1010   HCT 42.7 04/01/2017 1010   PLT 271 08/25/2015 1342   MCV 89 04/01/2017 1010   MCH 30.1 04/01/2017 1010   MCH 29.9 08/25/2015 1342   MCHC 34.0 04/01/2017 1010   MCHC 33.2 08/25/2015 1342   RDW 14.1 04/01/2017 1010   LYMPHSABS 2.4 04/01/2017 1010   EOSABS 0.1 04/01/2017 1010   BASOSABS 0.0 04/01/2017 1010   Iron/TIBC/Ferritin/ %Sat No results found for: IRON, TIBC, FERRITIN, IRONPCTSAT Lipid Panel     Component Value Date/Time   CHOL 183 07/10/2017 0827   TRIG 94 07/10/2017 0827   HDL 58 07/10/2017 0827   LDLCALC 106 (H)  07/10/2017 0827   Hepatic Function Panel     Component Value Date/Time   PROT 6.3 07/10/2017 0827   ALBUMIN 4.1 07/10/2017 0827   AST 16 07/10/2017 0827   ALT 16 07/10/2017 0827   ALKPHOS 57 07/10/2017 0827   BILITOT <0.2 07/10/2017 0827      Component Value Date/Time   TSH 1.350 04/01/2017 1010    ASSESSMENT AND PLAN: Essential hypertension  Class 2 severe obesity with serious comorbidity and body mass index (BMI) of 38.0 to 38.9 in adult, unspecified  obesity type (HCC)  PLAN:  Hypertension We discussed sodium restriction, working on healthy weight loss, and a regular exercise program as the means to achieve improved blood pressure control. Tyler [name] agreed with this plan and agreed to follow up as directed. We will continue to monitor his blood pressure as well as his progress with the above lifestyle modifications. He will continue his medications as prescribed, avoid skipping blood pressure medications, and will watch for signs of hypotension as he continues his lifestyle modifications. Tyler [name] agrees to follow up with our clinic in 2 to 3 weeks.  Obesity Tyler [name] is currently in the action stage of change. As such, his goal is to continue with weight loss efforts He has agreed to keep a food journal with 1200 calories and 85 grams of protein daily Tyler [name] has been instructed to work up to a goal of 150 minutes of combined cardio and strengthening exercise per week for weight loss and overall health benefits. We discussed the following Behavioral Modification Strategies today: increasing lean protein intake, work on meal planning and easy cooking plans, and increase H20 intake   Tyler [name] has agreed to follow up with our clinic in 2 to 3 weeks. He was informed of the importance of frequent follow up visits to maximize his success with intensive lifestyle modifications for his multiple health conditions.  I, Burt KnackSharon Martin, am acting as transcriptionist for Illa LevelSahar Elliott Lasecki, PA-C  I have  reviewed the above documentation for accuracy and completeness, and I agree with the above. -Illa LevelSahar Geonna Lockyer, PA-C  I have reviewed the above note and agree with the plan. -Quillian Quincearen Beasley, MD     Today's visit was # 13 out of 22.  Starting weight: 236 lbs Starting date: 04/01/17 Today's weight : 229 lbs  Today's date: 10/06/2017 Total lbs lost to date: 7 (Patients must lose 7 lbs in the first 6 months to continue with counseling)   ASK: We discussed the diagnosis of obesity with Tyler [name] today and Tyler [name] agreed to give us permission to discuss obesity behavioral modification therapy today.  ASSESS: Tyler [name] has the diagnosis of obesity and his BMI today is 38.11 Tyler [name] is in the action stage of change   ADVISE: Tyler [name] was educated on the multiple health risks of obesity as well as the benefit of weight loss to improve his health. He was advised of the need for long term treatment and the importance of lifestyle modifications.  AGREE: Multiple dietary modification options and treatment options were discussed and  Tyler [name] agreed to keep a food journal with 1200 calories and 85 grams of protein daily We discussed the following Behavioral Modification Strategies today: increasing lean protein intake, work on meal planning and easy cooking plans, and increase H20 intake

## 2017-10-28 ENCOUNTER — Encounter (INDEPENDENT_AMBULATORY_CARE_PROVIDER_SITE_OTHER): Payer: Self-pay

## 2017-10-28 ENCOUNTER — Ambulatory Visit (INDEPENDENT_AMBULATORY_CARE_PROVIDER_SITE_OTHER): Payer: Medicare Other | Admitting: Physician Assistant

## 2017-11-18 ENCOUNTER — Ambulatory Visit (INDEPENDENT_AMBULATORY_CARE_PROVIDER_SITE_OTHER): Payer: Medicare Other | Admitting: *Deleted

## 2017-11-18 DIAGNOSIS — J309 Allergic rhinitis, unspecified: Secondary | ICD-10-CM | POA: Diagnosis not present

## 2017-11-24 ENCOUNTER — Ambulatory Visit (INDEPENDENT_AMBULATORY_CARE_PROVIDER_SITE_OTHER): Payer: Medicare Other | Admitting: *Deleted

## 2017-11-24 ENCOUNTER — Encounter (INDEPENDENT_AMBULATORY_CARE_PROVIDER_SITE_OTHER): Payer: Self-pay | Admitting: Physician Assistant

## 2017-11-24 ENCOUNTER — Ambulatory Visit (INDEPENDENT_AMBULATORY_CARE_PROVIDER_SITE_OTHER): Payer: Medicare Other | Admitting: Physician Assistant

## 2017-11-24 VITALS — BP 132/77 | HR 58 | Temp 97.8°F | Ht 65.0 in | Wt 233.0 lb

## 2017-11-24 DIAGNOSIS — J309 Allergic rhinitis, unspecified: Secondary | ICD-10-CM | POA: Diagnosis not present

## 2017-11-24 DIAGNOSIS — I10 Essential (primary) hypertension: Secondary | ICD-10-CM

## 2017-11-24 DIAGNOSIS — E559 Vitamin D deficiency, unspecified: Secondary | ICD-10-CM

## 2017-11-24 DIAGNOSIS — Z6838 Body mass index (BMI) 38.0-38.9, adult: Secondary | ICD-10-CM | POA: Diagnosis not present

## 2017-11-24 MED ORDER — VITAMIN D (ERGOCALCIFEROL) 1.25 MG (50000 UNIT) PO CAPS: 50000.0000 [IU] | ORAL_CAPSULE | ORAL | 0 refills | Status: DC

## 2017-11-24 NOTE — Progress Notes (Signed)
Office: [phone]  /  Fax: [phone]   HPI:   Chief Complaint: OBESITY Tyler [name] is here to discuss his progress with his obesity treatment plan. He is on the keep a food journal with 1200 calories and 85 grams of protein daily and is following his eating plan approximately 90 % of the time. He states he is walking for 30 minutes 2 times per week. Tyler [name] has deviated with his eating and has not been planning ahead his meals. He is motivated to get back on track and continue with weight loss.  His weight is 233 lb (105.7 kg) today and has gained 4 pounds since his last visit. He has lost 3 lbs since starting treatment with [place].  Vitamin D deficiency Tyler [name] has a diagnosis of vitamin D deficiency. He is currently taking prescription Vit D and denies nausea, vomiting or muscle weakness.  Hypertension Tyler [name] is a 72 y.o. male with hypertension. Tyler [name]'s blood pressure is stable and he denies chest pain or shortness of breath. He is working weight loss to help control his blood pressure with the goal of decreasing his risk of heart attack and stroke. Tyler [name]'s blood pressure is currently controlled.  ALLERGIES: No Known Allergies  MEDICATIONS: Current Outpatient Medications on File Prior to Visit  Medication Sig Dispense Refill  . acetaminophen (TYLENOL) 500 MG tablet Take 500 mg by mouth every 6 (six) hours as needed for mild pain.    Marland Kitchen amLODipine (NORVASC) 5 MG tablet Take 5 mg by mouth daily.    . Cholecalciferol (VITAMIN D3) 2000 UNITS TABS Take 2,000 Units by mouth daily.    . CHROMIUM PO Take 1 tablet by mouth every morning.    Marland Kitchen CRESTOR 5 MG tablet Take 5 mg by mouth at bedtime.  1  . dextromethorphan-guaiFENesin (MUCINEX DM) 30-600 MG 12hr tablet Take 1 tablet by mouth 2 (two) times daily as needed for cough.    . docusate sodium (COLACE) 100 MG capsule Take 200 mg by mouth at bedtime as needed for mild constipation.    . Melatonin 10 MG CAPS Take 1 capsule as needed by  mouth. 30 capsule 0  . metFORMIN (GLUCOPHAGE) 500 MG tablet Take 1 tablet (500 mg total) by mouth every morning. 30 tablet 0  . multivitamin-iron-minerals-folic acid (CENTRUM) chewable tablet Chew 1 tablet by mouth daily.    . Potassium 99 MG TABS Take 99 mg by mouth daily.    . QNASL 80 MCG/ACT AERS ONE SPRAY EACH NOSTRIL TWICE DAILY FOR STUFFY NOSE OR DRAINAGE. 8.7 Inhaler 0  . triamterene-hydrochlorothiazide (MAXZIDE-25) 37.5-25 MG per tablet Take 0.5 tablets by mouth daily.      No current facility-administered medications on file prior to visit.     PAST MEDICAL HISTORY: Past Medical History:  Diagnosis Date  . Back pain   . Heartburn   . Hyperlipidemia   . Hypertension   . Insulin resistance   . Obesity     PAST SURGICAL HISTORY: Past Surgical History:  Procedure Laterality Date  . BACK SURGERY     03/14/2004  . EYE SURGERY     bil cataract  02/2013  . LUMBAR LAMINECTOMY/DECOMPRESSION MICRODISCECTOMY N/A 05/19/2015   Procedure: Lumbar four- five diskectomy;  Surgeon: Coletta Memos, MD;  Location: [facility];  Service: Neurosurgery;  Laterality: N/A;  L45 diskectomy    SOCIAL HISTORY: Social History   Tobacco Use  . Smoking status: Former Games developer  . Smokeless tobacco: Never Used  . Tobacco comment:  quit smokling in '70s  Substance Use Topics  . Alcohol use: Yes    Alcohol/week: 2.4 oz    Types: 4 Shots of liquor per week    Comment: social   . Drug use: No    FAMILY HISTORY: Family History  Problem Relation Age of Onset  . Hypertension Father   . Hyperlipidemia Father   . Heart disease Father   . Obesity Father     ROS: Review of Systems  Constitutional: Negative for weight loss.  Respiratory: Negative for shortness of breath.   Cardiovascular: Negative for chest pain.  Gastrointestinal: Negative for nausea and vomiting.  Musculoskeletal:       Negative muscle weakness    PHYSICAL EXAM: Blood pressure 132/77, pulse (!) 58, temperature 97.8 F (36.6  C), temperature source Oral, height 5\' 5"  (1.651 m), weight 233 lb (105.7 kg), SpO2 98 %. Body mass index is 38.77 kg/m. Physical Exam  Constitutional: He is oriented to person, place, and time. He appears well-developed and well-nourished.  Cardiovascular: Bradycardia present.  Pulmonary/Chest: Effort normal.  Musculoskeletal: Normal range of motion.  Neurological: He is oriented to person, place, and time.  Skin: Skin is warm and dry.  Psychiatric: He has a normal mood and affect. His behavior is normal.  Vitals reviewed.   RECENT LABS AND TESTS: BMET    Component Value Date/Time   NA 141 07/10/2017 0827   K 4.4 07/10/2017 0827   CL 107 (H) 07/10/2017 0827   CO2 21 07/10/2017 0827   GLUCOSE 113 (H) 07/10/2017 0827   GLUCOSE 112 (H) 08/25/2015 1342   BUN 20 07/10/2017 0827   CREATININE 1.03 07/10/2017 0827   CALCIUM 10.4 (H) 07/10/2017 0827   GFRNONAA 74 07/10/2017 0827   GFRAA 85 07/10/2017 0827   Lab Results  Component Value Date   HGBA1C 6.1 (H) 07/10/2017   HGBA1C 6.4 (H) 04/01/2017   Lab Results  Component Value Date   INSULIN 14.0 07/10/2017   INSULIN 14.6 04/01/2017   CBC    Component Value Date/Time   WBC 5.4 04/01/2017 1010   WBC 6.6 08/25/2015 1342   RBC 4.82 04/01/2017 1010   RBC 4.69 08/25/2015 1342   HGB 14.5 04/01/2017 1010   HCT 42.7 04/01/2017 1010   PLT 271 08/25/2015 1342   MCV 89 04/01/2017 1010   MCH 30.1 04/01/2017 1010   MCH 29.9 08/25/2015 1342   MCHC 34.0 04/01/2017 1010   MCHC 33.2 08/25/2015 1342   RDW 14.1 04/01/2017 1010   LYMPHSABS 2.4 04/01/2017 1010   EOSABS 0.1 04/01/2017 1010   BASOSABS 0.0 04/01/2017 1010   Iron/TIBC/Ferritin/ %Sat No results found for: IRON, TIBC, FERRITIN, IRONPCTSAT Lipid Panel     Component Value Date/Time   CHOL 183 07/10/2017 0827   TRIG 94 07/10/2017 0827   HDL 58 07/10/2017 0827   LDLCALC 106 (H) 07/10/2017 0827   Hepatic Function Panel     Component Value Date/Time   PROT 6.3  07/10/2017 0827   ALBUMIN 4.1 07/10/2017 0827   AST 16 07/10/2017 0827   ALT 16 07/10/2017 0827   ALKPHOS 57 07/10/2017 0827   BILITOT <0.2 07/10/2017 0827      Component Value Date/Time   TSH 1.350 04/01/2017 1010  Results for MARSHA, HILLMAN (MRN [id number]) as of 11/24/2017 10:07  Ref. Range 07/10/2017 08:27  Vitamin D, 25-Hydroxy Latest Ref Range: 30.0 - 100.0 ng/mL 52.1    ASSESSMENT AND PLAN: Vitamin D deficiency - Plan: Vitamin D, Ergocalciferol, (  DRISDOL) 50000 units CAPS capsule  Essential hypertension  Class 2 severe obesity with serious comorbidity and body mass index (BMI) of 38.0 to 38.9 in adult, unspecified obesity type (HCC)  PLAN:  Vitamin D Deficiency Tyler [name] was informed that low vitamin D levels contributes to fatigue and are associated with obesity, breast, and colon cancer. Tyler [name] agrees to continue taking prescription Vit D @50 ,000 IU every week #4 and we will refill for 1 month. She will follow up for routine testing of vitamin D, at least 2-3 times per year. He was informed of the risk of over-replacement of vitamin D and agrees to not increase his dose unless he discusses this with us first. Tyler [name] agrees to follow up with our clinic in 3 weeks.  Hypertension We discussed sodium restriction, working on healthy weight loss, and a regular exercise program as the means to achieve improved blood pressure control. Tyler [name] agreed with this plan and agreed to follow up as directed. We will continue to monitor his blood pressure as well as his progress with the above lifestyle modifications. He will continue his medications as prescribed and will watch for signs of hypotension as he continues his lifestyle modifications. Tyler [name] agrees to follow up with our clinic in 3 weeks.  Obesity Tyler [name] is currently in the action stage of change. As such, his goal is to continue with weight loss efforts He has agreed to change to follow the Category 2 plan Tyler [name] has been instructed  to work up to a goal of 150 minutes of combined cardio and strengthening exercise per week for weight loss and overall health benefits. We discussed the following Behavioral Modification Strategies today: increasing lean protein intake and work on meal planning and easy cooking plans   Tyler [name] has agreed to follow up with our clinic in 3 weeks. He was informed of the importance of frequent follow up visits to maximize his success with intensive lifestyle modifications for his multiple health conditions.   OBESITY BEHAVIORAL INTERVENTION VISIT  Today's visit was # 14 out of 22.  Starting weight: 236 lbs Starting date: 04/01/17 Today's weight : 233 lbs  Today's date: 11/24/2017 Total lbs lost to date: 3 (Patients must lose 7 lbs in the first 6 months to continue with counseling)   ASK: We discussed the diagnosis of obesity with Tyler [name] today and Tyler [name] agreed to give us permission to discuss obesity behavioral modification therapy today.  ASSESS: Tyler [name] has the diagnosis of obesity and his BMI today is 38.77 Tyler [name] is in the action stage of change   ADVISE: Tyler [name] was educated on the multiple health risks of obesity as well as the benefit of weight loss to improve his health. He was advised of the need for long term treatment and the importance of lifestyle modifications.  AGREE: Multiple dietary modification options and treatment options were discussed and  Tyler [name] agreed to the above obesity treatment plan.   Trude McburneyI, Sharon Martin, am acting as transcriptionist for Illa LevelSahar Osman, PA-C I, Illa LevelSahar Osman [facility], have reviewed this note and agree with its content.

## 2017-12-11 ENCOUNTER — Ambulatory Visit (INDEPENDENT_AMBULATORY_CARE_PROVIDER_SITE_OTHER): Payer: Medicare Other | Admitting: *Deleted

## 2017-12-11 DIAGNOSIS — J309 Allergic rhinitis, unspecified: Secondary | ICD-10-CM

## 2017-12-15 ENCOUNTER — Encounter (INDEPENDENT_AMBULATORY_CARE_PROVIDER_SITE_OTHER): Payer: Self-pay | Admitting: Physician Assistant

## 2017-12-15 ENCOUNTER — Ambulatory Visit (INDEPENDENT_AMBULATORY_CARE_PROVIDER_SITE_OTHER): Payer: Medicare Other | Admitting: Physician Assistant

## 2017-12-15 VITALS — BP 145/81 | HR 64 | Temp 98.0°F | Ht 65.0 in | Wt 232.0 lb

## 2017-12-15 DIAGNOSIS — E559 Vitamin D deficiency, unspecified: Secondary | ICD-10-CM

## 2017-12-15 DIAGNOSIS — Z6838 Body mass index (BMI) 38.0-38.9, adult: Secondary | ICD-10-CM

## 2017-12-15 DIAGNOSIS — I10 Essential (primary) hypertension: Secondary | ICD-10-CM

## 2017-12-15 MED ORDER — VITAMIN D (ERGOCALCIFEROL) 1.25 MG (50000 UNIT) PO CAPS: 50000.0000 [IU] | ORAL_CAPSULE | ORAL | 0 refills | Status: DC

## 2017-12-15 NOTE — Progress Notes (Signed)
Office: [phone]  /  Fax: [phone]   HPI:   Chief Complaint: OBESITY Tyler [name] is here to discuss his progress with his obesity treatment plan. He is on the  follow the Category 2 plan and is following his eating plan approximately 65 % of the time. He states he is walking for 30 minutes 2 times per week. Eliberto continues to do well with weight loss. He has been traveling for work, and was also on vacation since his last visit, but managed to be mindful of his eating.  His weight is 232 lb (105.2 kg) today and has had a weight loss of 1 pound over a period of 2 weeks since his last visit. He has lost 4 lbs since starting treatment with [place].  Hypertension  Jebadiah Imperato Brocks is a 72 y.o. male with hypertension.  Shanta A Sadler denies chest pain or shortness of breath on exertion. He is working weight loss to help control his blood pressure with the goal of decreasing his risk of heart attack and stroke. Jamess blood pressure is not currently controlled. He declines any adjustment of his medication today.   Vitamin D deficiency Kelii has a diagnosis of vitamin D deficiency. He is currently taking vit D and denies nausea, vomiting or muscle weakness.   Ref. Range 07/10/2017 08:27  Vitamin D, 25-Hydroxy Latest Ref Range: 30.0 - 100.0 ng/mL 52.1   ALLERGIES: No Known Allergies  MEDICATIONS: Current Outpatient Medications on File Prior to Visit  Medication Sig Dispense Refill  . acetaminophen (TYLENOL) 500 MG tablet Take 500 mg by mouth every 6 (six) hours as needed for mild pain.    Marland Kitchen amLODipine (NORVASC) 5 MG tablet Take 5 mg by mouth daily.    . Cholecalciferol (VITAMIN D3) 2000 UNITS TABS Take 2,000 Units by mouth daily.    . CHROMIUM PO Take 1 tablet by mouth every morning.    Marland Kitchen CRESTOR 5 MG tablet Take 5 mg by mouth at bedtime.  1  . dextromethorphan-guaiFENesin (MUCINEX DM) 30-600 MG 12hr tablet Take 1 tablet by mouth 2 (two) times daily as needed for cough.    .  docusate sodium (COLACE) 100 MG capsule Take 200 mg by mouth at bedtime as needed for mild constipation.    . Melatonin 10 MG CAPS Take 1 capsule as needed by mouth. 30 capsule 0  . metFORMIN (GLUCOPHAGE) 500 MG tablet Take 1 tablet (500 mg total) by mouth every morning. 30 tablet 0  . multivitamin-iron-minerals-folic acid (CENTRUM) chewable tablet Chew 1 tablet by mouth daily.    . Potassium 99 MG TABS Take 99 mg by mouth daily.    . QNASL 80 MCG/ACT AERS ONE SPRAY EACH NOSTRIL TWICE DAILY FOR STUFFY NOSE OR DRAINAGE. 8.7 Inhaler 0  . triamterene-hydrochlorothiazide (MAXZIDE-25) 37.5-25 MG per tablet Take 0.5 tablets by mouth daily.      No current facility-administered medications on file prior to visit.     PAST MEDICAL HISTORY: Past Medical History:  Diagnosis Date  . Back pain   . Heartburn   . Hyperlipidemia   . Hypertension   . Insulin resistance   . Obesity     PAST SURGICAL HISTORY: Past Surgical History:  Procedure Laterality Date  . BACK SURGERY     03/14/2004  . EYE SURGERY     bil cataract  02/2013  . LUMBAR LAMINECTOMY/DECOMPRESSION MICRODISCECTOMY N/A 05/19/2015   Procedure: Lumbar four- five diskectomy;  Surgeon: Coletta Memos, MD;  Location: [facility];  Service: Neurosurgery;  Laterality: N/A;  L45 diskectomy    SOCIAL HISTORY: Social History   Tobacco Use  . Smoking status: Former Games developermoker  . Smokeless tobacco: Never Used  . Tobacco comment: quit smokling in '70s  Substance Use Topics  . Alcohol use: Yes    Alcohol/week: 2.4 oz    Types: 4 Shots of liquor per week    Comment: social   . Drug use: No    FAMILY HISTORY: Family History  Problem Relation Age of Onset  . Hypertension Father   . Hyperlipidemia Father   . Heart disease Father   . Obesity Father     ROS: Review of Systems  Constitutional: Positive for weight loss.  Respiratory: Negative for shortness of breath.   Cardiovascular: Negative for chest pain.  Gastrointestinal: Negative  for nausea and vomiting.  Musculoskeletal:       Negative for muscle weakness    PHYSICAL EXAM: Blood pressure (!) 145/81, pulse 64, temperature 98 F (36.7 C), temperature source Oral, height 5\' 5"  (1.651 m), weight 232 lb (105.2 kg), SpO2 99 %. Body mass index is 38.61 kg/m. Physical Exam  Constitutional: He is oriented to person, place, and time. He appears well-developed and well-nourished.  HENT:  Head: Normocephalic.  Eyes: EOM are normal.  Neck: Normal range of motion.  Cardiovascular: Normal rate.  Pulmonary/Chest: Effort normal.  Musculoskeletal: Normal range of motion.  Neurological: He is alert and oriented to person, place, and time.  Skin: Skin is warm and dry.  Psychiatric: He has a normal mood and affect. His behavior is normal.  Vitals reviewed.   RECENT LABS AND TESTS: BMET    Component Value Date/Time   NA 141 07/10/2017 0827   K 4.4 07/10/2017 0827   CL 107 (H) 07/10/2017 0827   CO2 21 07/10/2017 0827   GLUCOSE 113 (H) 07/10/2017 0827   GLUCOSE 112 (H) 08/25/2015 1342   BUN 20 07/10/2017 0827   CREATININE 1.03 07/10/2017 0827   CALCIUM 10.4 (H) 07/10/2017 0827   GFRNONAA 74 07/10/2017 0827   GFRAA 85 07/10/2017 0827   Lab Results  Component Value Date   HGBA1C 6.1 (H) 07/10/2017   HGBA1C 6.4 (H) 04/01/2017   Lab Results  Component Value Date   INSULIN 14.0 07/10/2017   INSULIN 14.6 04/01/2017   CBC    Component Value Date/Time   WBC 5.4 04/01/2017 1010   WBC 6.6 08/25/2015 1342   RBC 4.82 04/01/2017 1010   RBC 4.69 08/25/2015 1342   HGB 14.5 04/01/2017 1010   HCT 42.7 04/01/2017 1010   PLT 271 08/25/2015 1342   MCV 89 04/01/2017 1010   MCH 30.1 04/01/2017 1010   MCH 29.9 08/25/2015 1342   MCHC 34.0 04/01/2017 1010   MCHC 33.2 08/25/2015 1342   RDW 14.1 04/01/2017 1010   LYMPHSABS 2.4 04/01/2017 1010   EOSABS 0.1 04/01/2017 1010   BASOSABS 0.0 04/01/2017 1010   Iron/TIBC/Ferritin/ %Sat No results found for: IRON, TIBC,  FERRITIN, IRONPCTSAT Lipid Panel     Component Value Date/Time   CHOL 183 07/10/2017 0827   TRIG 94 07/10/2017 0827   HDL 58 07/10/2017 0827   LDLCALC 106 (H) 07/10/2017 0827   Hepatic Function Panel     Component Value Date/Time   PROT 6.3 07/10/2017 0827   ALBUMIN 4.1 07/10/2017 0827   AST 16 07/10/2017 0827   ALT 16 07/10/2017 0827   ALKPHOS 57 07/10/2017 0827   BILITOT <0.2 07/10/2017 0827  Component Value Date/Time   TSH 1.350 04/01/2017 1010    Ref. Range 07/10/2017 08:27  Vitamin D, 25-Hydroxy Latest Ref Range: 30.0 - 100.0 ng/mL 52.1    ASSESSMENT AND PLAN: Essential hypertension  Vitamin D deficiency - Plan: Vitamin D, Ergocalciferol, (DRISDOL) 50000 units CAPS capsule  Class 2 severe obesity with serious comorbidity and body mass index (BMI) of 38.0 to 38.9 in adult, unspecified obesity type (HCC)  PLAN: Hypertension We discussed sodium restriction, working on healthy weight loss, and a regular exercise program as the means to achieve improved blood pressure control. Fayrene Fearing agreed with this plan and agreed to follow up as directed. We will continue to monitor his blood pressure as well as his progress with the above lifestyle modifications. He will continue his medications as prescribed and will watch for signs of hypotension as he continues his lifestyle modifications.  Vitamin D Deficiency Elvert was informed that low vitamin D levels contributes to fatigue and are associated with obesity, breast, and colon cancer. He agrees to continue to take prescription Vit D @50 ,000 IU every week #4 with no refills and will follow up for routine testing of vitamin D, at least 2-3 times per year. He was informed of the risk of over-replacement of vitamin D and agrees to not increase his dose unless he discusses this with [place] first.  Obesity Khamron is currently in the action stage of change. As such, his goal is to continue with weight loss efforts He has agreed to follow the  Category 2 plan Shreyansh has been instructed to work up to a goal of 150 minutes of combined cardio and strengthening exercise per week for weight loss and overall health benefits. We discussed the following Behavioral Modification Strategies today: increasing lean protein intake and keeping healthy food in the home.    Chey has agreed to follow up with our clinic in 2 weeks. He was informed of the importance of frequent follow up visits to maximize his success with intensive lifestyle modifications for his multiple health conditions.    Today's visit was # 15 out of 22.  Starting weight: 236 lbs Starting date: 04/01/17 Today's weight : 232 lbs  Today's date: 12/15/2017 Total lbs lost to date: 4 (Patients must lose 7 lbs in the first 6 months to continue with counseling)   ASK: We discussed the diagnosis of obesity with Rudy Jew Kimpel today and Merritt agreed to give [place] permission to discuss obesity behavioral modification therapy today.  ASSESS: Knoxx has the diagnosis of obesity and his BMI today is 38.61 Amir is in the action stage of change   ADVISE: Arran was educated on the multiple health risks of obesity as well as the benefit of weight loss to improve his health. He was advised of the need for long term treatment and the importance of lifestyle modifications.  AGREE: Multiple dietary modification options and treatment options were discussed and  Avett agreed to the above obesity treatment plan.   Otis Peak, am acting as transcriptionist for [employer], PA-C I, [facility], have reviewed this note and agree with its content.

## 2017-12-18 ENCOUNTER — Ambulatory Visit (INDEPENDENT_AMBULATORY_CARE_PROVIDER_SITE_OTHER): Payer: Medicare Other | Admitting: *Deleted

## 2017-12-18 DIAGNOSIS — J309 Allergic rhinitis, unspecified: Secondary | ICD-10-CM

## 2017-12-29 ENCOUNTER — Ambulatory Visit (INDEPENDENT_AMBULATORY_CARE_PROVIDER_SITE_OTHER): Payer: Medicare Other | Admitting: Physician Assistant

## 2017-12-29 ENCOUNTER — Encounter (INDEPENDENT_AMBULATORY_CARE_PROVIDER_SITE_OTHER): Payer: Self-pay

## 2018-01-01 NOTE — Progress Notes (Signed)
VIALS EXP 01-02-19 

## 2018-01-07 DIAGNOSIS — J301 Allergic rhinitis due to pollen: Secondary | ICD-10-CM | POA: Diagnosis not present

## 2018-01-08 DIAGNOSIS — J3089 Other allergic rhinitis: Secondary | ICD-10-CM | POA: Diagnosis not present

## 2018-01-09 ENCOUNTER — Ambulatory Visit (INDEPENDENT_AMBULATORY_CARE_PROVIDER_SITE_OTHER): Payer: Medicare Other

## 2018-01-09 DIAGNOSIS — J309 Allergic rhinitis, unspecified: Secondary | ICD-10-CM | POA: Diagnosis not present

## 2018-01-12 DIAGNOSIS — J3081 Allergic rhinitis due to animal (cat) (dog) hair and dander: Secondary | ICD-10-CM | POA: Diagnosis not present

## 2018-01-16 ENCOUNTER — Ambulatory Visit (INDEPENDENT_AMBULATORY_CARE_PROVIDER_SITE_OTHER): Payer: Medicare Other

## 2018-01-16 DIAGNOSIS — J309 Allergic rhinitis, unspecified: Secondary | ICD-10-CM

## 2018-01-19 ENCOUNTER — Ambulatory Visit (INDEPENDENT_AMBULATORY_CARE_PROVIDER_SITE_OTHER): Payer: Medicare Other | Admitting: Physician Assistant

## 2018-01-19 VITALS — BP 129/76 | HR 63 | Temp 98.1°F | Ht 65.0 in | Wt 231.0 lb

## 2018-01-19 DIAGNOSIS — E559 Vitamin D deficiency, unspecified: Secondary | ICD-10-CM

## 2018-01-19 DIAGNOSIS — Z6838 Body mass index (BMI) 38.0-38.9, adult: Secondary | ICD-10-CM

## 2018-01-19 NOTE — Progress Notes (Signed)
Office: [phone]  /  Fax: [phone]   HPI:   Chief Complaint: OBESITY Tyler [name] is here to discuss his progress with his obesity treatment plan. He is on the Category 2 plan and is following his eating plan approximately 75 % of the time. He states he is walking for 46 minutes 4 times per week. Tyler [name] has recently lost his brother and he is managing his eating despite his recent loss. Tyler [name] continues to [employer] and he controls his portions. His weight is 231 lb (104.8 kg) today and has had [name] weight loss of 1 pound over [name] period of 5 weeks since his last visit. He has lost 5 lbs since starting treatment with Tyler [name].  Vitamin D deficiency Tyler [name] has [name] diagnosis of vitamin D deficiency. He is currently taking vit D and denies nausea, vomiting or muscle weakness.  ALLERGIES: No Known Allergies  MEDICATIONS: Current Outpatient Medications on File Prior to Visit  Medication Sig Dispense Refill  . acetaminophen (TYLENOL) 500 MG tablet Take 500 mg by mouth every 6 (six) hours as needed for mild pain.    Marland Kitchen amLODipine (NORVASC) 5 MG tablet Take 5 mg by mouth daily.    . Cholecalciferol (VITAMIN D3) 2000 UNITS TABS Take 2,000 Units by mouth daily.    . CHROMIUM PO Take 1 tablet by mouth every morning.    Marland Kitchen CRESTOR 5 MG tablet Take 5 mg by mouth at bedtime.  1  . dextromethorphan-guaiFENesin (MUCINEX DM) 30-600 MG 12hr tablet Take 1 tablet by mouth 2 (two) times daily as needed for cough.    . docusate sodium (COLACE) 100 MG capsule Take 200 mg by mouth at bedtime as needed for mild constipation.    . Melatonin 10 MG CAPS Take 1 capsule as needed by mouth. 30 capsule 0  . metFORMIN (GLUCOPHAGE) 500 MG tablet Take 1 tablet (500 mg total) by mouth every morning. 30 tablet 0  . multivitamin-iron-minerals-folic acid (CENTRUM) chewable tablet Chew 1 tablet by mouth daily.    . Potassium 99 MG TABS Take 99 mg by mouth daily.    . QNASL 80 MCG/ACT AERS ONE SPRAY EACH NOSTRIL TWICE DAILY  FOR STUFFY NOSE OR DRAINAGE. 8.7 Inhaler 0  . triamterene-hydrochlorothiazide (MAXZIDE-25) 37.5-25 MG per tablet Take 0.5 tablets by mouth daily.     . Vitamin D, Ergocalciferol, (DRISDOL) 50000 units CAPS capsule Take 1 capsule (50,000 Units total) by mouth every 7 (seven) days. 2 capsule 0   No current facility-administered medications on file prior to visit.     PAST MEDICAL HISTORY: Past Medical History:  Diagnosis Date  . Back pain   . Heartburn   . Hyperlipidemia   . Hypertension   . Insulin resistance   . Obesity     PAST SURGICAL HISTORY: Past Surgical History:  Procedure Laterality Date  . BACK SURGERY     03/14/2004  . EYE SURGERY     bil cataract  02/2013  . LUMBAR LAMINECTOMY/DECOMPRESSION MICRODISCECTOMY N/[name] 05/19/2015   Procedure: Lumbar four- five diskectomy;  Surgeon: Coletta Memos, MD;  Location: [facility];  Service: Neurosurgery;  Laterality: N/[name];  L45 diskectomy    SOCIAL HISTORY: Social History   Tobacco Use  . Smoking status: Former Games developer  . Smokeless tobacco: Never Used  . Tobacco comment: quit smokling in '70s  Substance Use Topics  . Alcohol use: Yes    Alcohol/week: 2.4 oz    Types: 4 Shots of liquor per week  Comment: social   . Drug use: No    FAMILY HISTORY: Family History  Problem Relation Age of Onset  . Hypertension Father   . Hyperlipidemia Father   . Heart disease Father   . Obesity Father     ROS: Review of Systems  Constitutional: Positive for weight loss.  Gastrointestinal: Negative for nausea and vomiting.  Musculoskeletal:       Negative for muscle weakness    PHYSICAL EXAM: Blood pressure 129/76, pulse 63, temperature 98.1 F (36.7 C), temperature source Oral, height 5\' 5"  (1.651 m), weight 231 lb (104.8 kg), SpO2 98 %. Body mass index is 38.44 kg/m. Physical Exam  Constitutional: He is oriented to person, place, and time. He appears well-developed and well-nourished.  Cardiovascular: Normal rate.    Pulmonary/Chest: Effort normal.  Musculoskeletal: Normal range of motion.  Neurological: He is oriented to person, place, and time.  Skin: Skin is warm and dry.  Psychiatric: He has [name] normal mood and affect. His behavior is normal.  Vitals reviewed.   RECENT LABS AND TESTS: BMET    Component Value Date/Time   NA 141 07/10/2017 0827   K 4.4 07/10/2017 0827   CL 107 (H) 07/10/2017 0827   CO2 21 07/10/2017 0827   GLUCOSE 113 (H) 07/10/2017 0827   GLUCOSE 112 (H) 08/25/2015 1342   BUN 20 07/10/2017 0827   CREATININE 1.03 07/10/2017 0827   CALCIUM 10.4 (H) 07/10/2017 0827   GFRNONAA 74 07/10/2017 0827   GFRAA 85 07/10/2017 0827   Lab Results  Component Value Date   HGBA1C 6.1 (H) 07/10/2017   HGBA1C 6.4 (H) 04/01/2017   Lab Results  Component Value Date   INSULIN 14.0 07/10/2017   INSULIN 14.6 04/01/2017   CBC    Component Value Date/Time   WBC 5.4 04/01/2017 1010   WBC 6.6 08/25/2015 1342   RBC 4.82 04/01/2017 1010   RBC 4.69 08/25/2015 1342   HGB 14.5 04/01/2017 1010   HCT 42.7 04/01/2017 1010   PLT 271 08/25/2015 1342   MCV 89 04/01/2017 1010   MCH 30.1 04/01/2017 1010   MCH 29.9 08/25/2015 1342   MCHC 34.0 04/01/2017 1010   MCHC 33.2 08/25/2015 1342   RDW 14.1 04/01/2017 1010   LYMPHSABS 2.4 04/01/2017 1010   EOSABS 0.1 04/01/2017 1010   BASOSABS 0.0 04/01/2017 1010   Iron/TIBC/Ferritin/ %Sat No results found for: IRON, TIBC, FERRITIN, IRONPCTSAT Lipid Panel     Component Value Date/Time   CHOL 183 07/10/2017 0827   TRIG 94 07/10/2017 0827   HDL 58 07/10/2017 0827   LDLCALC 106 (H) 07/10/2017 0827   Hepatic Function Panel     Component Value Date/Time   PROT 6.3 07/10/2017 0827   ALBUMIN 4.1 07/10/2017 0827   AST 16 07/10/2017 0827   ALT 16 07/10/2017 0827   ALKPHOS 57 07/10/2017 0827   BILITOT <0.2 07/10/2017 0827      Component Value Date/Time   TSH 1.350 04/01/2017 1010   Results for Tyler [name], Tyler [name] (MRN [id number]) as of  01/19/2018 15:37  Ref. Range 07/10/2017 08:27  Vitamin D, 25-Hydroxy Latest Ref Range: 30.0 - 100.0 ng/mL 52.1   ASSESSMENT AND PLAN: Vitamin D deficiency  Class 2 severe obesity with serious comorbidity and body mass index (BMI) of 38.0 to 38.9 in adult, unspecified obesity type (HCC)  PLAN:  Vitamin D Deficiency Tyler [name] was informed that low vitamin D levels contributes to fatigue and are associated with obesity, breast, and colon cancer. He  agrees to continue to take prescription Vit D @50 ,000 IU every week and will follow up for routine testing of vitamin D, at least 2-3 times per year. He was informed of the risk of over-replacement of vitamin D and agrees to not increase his dose unless he discusses this with Tyler [name] first.  We spent > than 50% of the 15 minute visit on the counseling as documented in the note.  Obesity Tyler [name] is currently in the action stage of change. As such, his goal is to continue with weight loss efforts He has agreed to follow the Category 2 plan Tyler [name] has been instructed to work up to [name] goal of 150 minutes of combined cardio and strengthening exercise per week for weight loss and overall health benefits. We discussed the following Behavioral Modification Strategies today: increasing lean protein intake and work on meal planning and easy cooking plans  Tyler [name] has agreed to follow up with our clinic in 2 weeks. He was informed of the importance of frequent follow up visits to maximize his success with intensive lifestyle modifications for his multiple health conditions.    OBESITY BEHAVIORAL INTERVENTION VISIT  Today's visit was # 16 out of 22.  Starting weight: 236 lbs Starting date: 04/01/17 Today's weight : 231 lbs  Today's date: 01/19/2018 Total lbs lost to date: 5 (Patients must lose 7 lbs in the first 6 months to continue with counseling)   ASK: We discussed the diagnosis of obesity with Tyler [name] today and Tyler [name] agreed to give Tyler [name] permission to  discuss obesity behavioral modification therapy today.  ASSESS: Shaylon has the diagnosis of obesity and his BMI today is 38.44 Tyler [name] is in the action stage of change   ADVISE: Tyler [name] was educated on the multiple health risks of obesity as well as the benefit of weight loss to improve his health. He was advised of the need for long term treatment and the importance of lifestyle modifications.  AGREE: Multiple dietary modification options and treatment options were discussed and  Tyler [name] agreed to the above obesity treatment plan.   Cristi Loron, am acting as transcriptionist for [employer], PA-C I, [facility], have reviewed this note and agree with its content

## 2018-01-23 ENCOUNTER — Ambulatory Visit (INDEPENDENT_AMBULATORY_CARE_PROVIDER_SITE_OTHER): Payer: Medicare Other

## 2018-01-23 DIAGNOSIS — J309 Allergic rhinitis, unspecified: Secondary | ICD-10-CM | POA: Diagnosis not present

## 2018-02-02 ENCOUNTER — Ambulatory Visit (INDEPENDENT_AMBULATORY_CARE_PROVIDER_SITE_OTHER): Payer: Medicare Other | Admitting: Physician Assistant

## 2018-02-02 VITALS — BP 133/73 | HR 65 | Temp 98.4°F | Ht 65.0 in | Wt 232.0 lb

## 2018-02-02 DIAGNOSIS — Z6838 Body mass index (BMI) 38.0-38.9, adult: Secondary | ICD-10-CM | POA: Diagnosis not present

## 2018-02-02 DIAGNOSIS — E559 Vitamin D deficiency, unspecified: Secondary | ICD-10-CM | POA: Diagnosis not present

## 2018-02-02 DIAGNOSIS — R7303 Prediabetes: Secondary | ICD-10-CM

## 2018-02-02 MED ORDER — VITAMIN D (ERGOCALCIFEROL) 1.25 MG (50000 UNIT) PO CAPS
50000.0000 [IU] | ORAL_CAPSULE | ORAL | 0 refills | Status: DC
Start: 2018-02-02 — End: 2018-03-09

## 2018-02-02 NOTE — Progress Notes (Signed)
Office: [phone]  /  Fax: [phone]   HPI:   Chief Complaint: OBESITY Tyler [name] is here to discuss his progress with his obesity treatment plan. He is on the Category 2 plan and is following his eating plan approximately 80 % of the time. He states he is at [facility] walking 2 and a half miles 4 times per week. Cassandra has been mindful of his eating but is not staying within the meal plan.  His weight is 232 lb (105.2 kg) today and has gained 1 pound since his last visit. He has lost 4 lbs since starting treatment with [place].  Vitamin D Deficiency Tyler [name] has a diagnosis of vitamin D deficiency. He is currently taking prescription Vit D and denies nausea, vomiting or muscle weakness.  Pre-Diabetes Tyler [name] has a diagnosis of pre-diabetes based on his elevated Hgb A1c and was informed this puts him at greater risk of developing diabetes. He is on metformin currently and continues to work on diet and exercise to decrease risk of diabetes. He denies polyphagia, nausea or hypoglycemia.  ALLERGIES: No Known Allergies  MEDICATIONS: Current Outpatient Medications on File Prior to Visit  Medication Sig Dispense Refill  . acetaminophen (TYLENOL) 500 MG tablet Take 500 mg by mouth every 6 (six) hours as needed for mild pain.    Marland Kitchen amLODipine (NORVASC) 5 MG tablet Take 5 mg by mouth daily.    . Cholecalciferol (VITAMIN D3) 2000 UNITS TABS Take 2,000 Units by mouth daily.    . CHROMIUM PO Take 1 tablet by mouth every morning.    Marland Kitchen CRESTOR 5 MG tablet Take 5 mg by mouth at bedtime.  1  . dextromethorphan-guaiFENesin (MUCINEX DM) 30-600 MG 12hr tablet Take 1 tablet by mouth 2 (two) times daily as needed for cough.    . docusate sodium (COLACE) 100 MG capsule Take 200 mg by mouth at bedtime as needed for mild constipation.    . Melatonin 10 MG CAPS Take 1 capsule as needed by mouth. 30 capsule 0  . metFORMIN (GLUCOPHAGE) 500 MG tablet Take 1 tablet (500 mg total) by mouth every morning. 30 tablet 0  .  multivitamin-iron-minerals-folic acid (CENTRUM) chewable tablet Chew 1 tablet by mouth daily.    . Potassium 99 MG TABS Take 99 mg by mouth daily.    . QNASL 80 MCG/ACT AERS ONE SPRAY EACH NOSTRIL TWICE DAILY FOR STUFFY NOSE OR DRAINAGE. 8.7 Inhaler 0  . triamterene-hydrochlorothiazide (MAXZIDE-25) 37.5-25 MG per tablet Take 0.5 tablets by mouth daily.      No current facility-administered medications on file prior to visit.     PAST MEDICAL HISTORY: Past Medical History:  Diagnosis Date  . Back pain   . Heartburn   . Hyperlipidemia   . Hypertension   . Insulin resistance   . Obesity     PAST SURGICAL HISTORY: Past Surgical History:  Procedure Laterality Date  . BACK SURGERY     03/14/2004  . EYE SURGERY     bil cataract  02/2013  . LUMBAR LAMINECTOMY/DECOMPRESSION MICRODISCECTOMY N/A 05/19/2015   Procedure: Lumbar four- five diskectomy;  Surgeon: Coletta Memos, MD;  Location: [facility];  Service: Neurosurgery;  Laterality: N/A;  L45 diskectomy    SOCIAL HISTORY: Social History   Tobacco Use  . Smoking status: Former Games developer  . Smokeless tobacco: Never Used  . Tobacco comment: quit smokling in '70s  Substance Use Topics  . Alcohol use: Yes    Alcohol/week: 2.4 oz    Types:  4 Shots of liquor per week    Comment: social   . Drug use: No    FAMILY HISTORY: Family History  Problem Relation Age of Onset  . Hypertension Father   . Hyperlipidemia Father   . Heart disease Father   . Obesity Father     ROS: Review of Systems  Constitutional: Positive for weight loss.  Gastrointestinal: Negative for nausea and vomiting.  Musculoskeletal:       Negative muscle weakness  Endo/Heme/Allergies:       Negative polyphagia Negative hypoglycemia    PHYSICAL EXAM: Blood pressure 133/73, pulse 65, temperature 98.4 F (36.9 C), height 5\' 5"  (1.651 m), weight 232 lb (105.2 kg), SpO2 99 %. Body mass index is 38.61 kg/m. Physical Exam  Constitutional: He is oriented to  person, place, and time. He appears well-developed and well-nourished.  Cardiovascular: Normal rate.  Pulmonary/Chest: Effort normal.  Musculoskeletal: Normal range of motion.  Neurological: He is oriented to person, place, and time.  Skin: Skin is warm and dry.  Psychiatric: He has a normal mood and affect. His behavior is normal.  Vitals reviewed.   RECENT LABS AND TESTS: BMET    Component Value Date/Time   NA 141 07/10/2017 0827   K 4.4 07/10/2017 0827   CL 107 (H) 07/10/2017 0827   CO2 21 07/10/2017 0827   GLUCOSE 113 (H) 07/10/2017 0827   GLUCOSE 112 (H) 08/25/2015 1342   BUN 20 07/10/2017 0827   CREATININE 1.03 07/10/2017 0827   CALCIUM 10.4 (H) 07/10/2017 0827   GFRNONAA 74 07/10/2017 0827   GFRAA 85 07/10/2017 0827   Lab Results  Component Value Date   HGBA1C 6.1 (H) 07/10/2017   HGBA1C 6.4 (H) 04/01/2017   Lab Results  Component Value Date   INSULIN 14.0 07/10/2017   INSULIN 14.6 04/01/2017   CBC    Component Value Date/Time   WBC 5.4 04/01/2017 1010   WBC 6.6 08/25/2015 1342   RBC 4.82 04/01/2017 1010   RBC 4.69 08/25/2015 1342   HGB 14.5 04/01/2017 1010   HCT 42.7 04/01/2017 1010   PLT 271 08/25/2015 1342   MCV 89 04/01/2017 1010   MCH 30.1 04/01/2017 1010   MCH 29.9 08/25/2015 1342   MCHC 34.0 04/01/2017 1010   MCHC 33.2 08/25/2015 1342   RDW 14.1 04/01/2017 1010   LYMPHSABS 2.4 04/01/2017 1010   EOSABS 0.1 04/01/2017 1010   BASOSABS 0.0 04/01/2017 1010   Iron/TIBC/Ferritin/ %Sat No results found for: IRON, TIBC, FERRITIN, IRONPCTSAT Lipid Panel     Component Value Date/Time   CHOL 183 07/10/2017 0827   TRIG 94 07/10/2017 0827   HDL 58 07/10/2017 0827   LDLCALC 106 (H) 07/10/2017 0827   Hepatic Function Panel     Component Value Date/Time   PROT 6.3 07/10/2017 0827   ALBUMIN 4.1 07/10/2017 0827   AST 16 07/10/2017 0827   ALT 16 07/10/2017 0827   ALKPHOS 57 07/10/2017 0827   BILITOT <0.2 07/10/2017 0827      Component Value  Date/Time   TSH 1.350 04/01/2017 1010  Results for [name], Tyler A (MRN [id number]) as of 02/02/2018 16:59  Ref. Range 07/10/2017 08:27  Vitamin D, 25-Hydroxy Latest Ref Range: 30.0 - 100.0 ng/mL 52.1    ASSESSMENT AND PLAN: Vitamin D deficiency - Plan: Vitamin D, Ergocalciferol, (DRISDOL) 50000 units CAPS capsule  Prediabetes  Class 2 severe obesity with serious comorbidity and body mass index (BMI) of 38.0 to 38.9 in adult, unspecified obesity type (  HCC)  PLAN:  Vitamin D Deficiency Sevag was informed that low vitamin D levels contributes to fatigue and are associated with obesity, breast, and colon cancer. Tyler [name] agrees to continue taking prescription Vit D @50 ,000 IU every week #4 and we will refill for 1 month. He will follow up for routine testing of vitamin D, at least 2-3 times per year. He was informed of the risk of over-replacement of vitamin D and agrees to not increase his dose unless he discusses this with [place] first. Tyler [name] agrees to follow up with our clinic in 2 weeks.  Pre-Diabetes Tyler [name] will continue to work on weight loss, exercise, and decreasing simple carbohydrates in his diet to help decrease the risk of diabetes. We dicussed metformin including benefits and risks. He was informed that eating too many simple carbohydrates or too many calories at one sitting increases the likelihood of GI side effects. Tyler [name] agrees to continue taking metformin and he agrees to follow up with our clinic in 2 weeks as directed to monitor his progress.  Obesity Tyler [name] is currently in the action stage of change. As such, his goal is to continue with weight loss efforts He has agreed to follow the Category 2 plan Tyler [name] has been instructed to work up to a goal of 150 minutes of combined cardio and strengthening exercise per week for weight loss and overall health benefits. We discussed the following Behavioral Modification Strategies today: increasing lean protein intake and work on meal  planning and easy cooking plans   Tyler [name] has agreed to follow up with our clinic in 2 weeks. He was informed of the importance of frequent follow up visits to maximize his success with intensive lifestyle modifications for his multiple health conditions.   OBESITY BEHAVIORAL INTERVENTION VISIT  Today's visit was # 17 out of 22.  Starting weight: 236 lbs Starting date: 04/01/17 Today's weight : 232 lbs Today's date: 02/02/2018 Total lbs lost to date: 4 (Patients must lose 7 lbs in the first 6 months to continue with counseling)   ASK: We discussed the diagnosis of obesity with Tyler [name] today and Tyler [name] agreed to give [place] permission to discuss obesity behavioral modification therapy today.  ASSESS: Tyler [name] has the diagnosis of obesity and his BMI today is 38.61 Tyler [name] is in the action stage of change   ADVISE: Tyler [name] was educated on the multiple health risks of obesity as well as the benefit of weight loss to improve his health. He was advised of the need for long term treatment and the importance of lifestyle modifications.  AGREE: Multiple dietary modification options and treatment options were discussed and  Tyler [name] agreed to the above obesity treatment plan.   Trude Mcburney, am acting as transcriptionist for Illa Level, PA-C I, Illa Level [facility], have reviewed this note and agree with its contents

## 2018-02-17 ENCOUNTER — Encounter (INDEPENDENT_AMBULATORY_CARE_PROVIDER_SITE_OTHER): Payer: Self-pay

## 2018-02-17 ENCOUNTER — Ambulatory Visit (INDEPENDENT_AMBULATORY_CARE_PROVIDER_SITE_OTHER): Payer: Medicare Other | Admitting: Physician Assistant

## 2018-02-19 ENCOUNTER — Ambulatory Visit (INDEPENDENT_AMBULATORY_CARE_PROVIDER_SITE_OTHER): Payer: Medicare Other | Admitting: *Deleted

## 2018-02-19 DIAGNOSIS — J309 Allergic rhinitis, unspecified: Secondary | ICD-10-CM

## 2018-03-06 ENCOUNTER — Ambulatory Visit (INDEPENDENT_AMBULATORY_CARE_PROVIDER_SITE_OTHER): Payer: Medicare Other

## 2018-03-06 DIAGNOSIS — J309 Allergic rhinitis, unspecified: Secondary | ICD-10-CM | POA: Diagnosis not present

## 2018-03-09 ENCOUNTER — Ambulatory Visit (INDEPENDENT_AMBULATORY_CARE_PROVIDER_SITE_OTHER): Payer: Medicare Other | Admitting: Physician Assistant

## 2018-03-09 VITALS — BP 126/76 | HR 66 | Temp 98.0°F | Ht 65.0 in | Wt 231.0 lb

## 2018-03-09 DIAGNOSIS — Z6838 Body mass index (BMI) 38.0-38.9, adult: Secondary | ICD-10-CM

## 2018-03-09 DIAGNOSIS — R7303 Prediabetes: Secondary | ICD-10-CM

## 2018-03-09 DIAGNOSIS — E559 Vitamin D deficiency, unspecified: Secondary | ICD-10-CM

## 2018-03-09 MED ORDER — VITAMIN D (ERGOCALCIFEROL) 1.25 MG (50000 UNIT) PO CAPS: 50000.0000 [IU] | ORAL_CAPSULE | ORAL | 0 refills | Status: DC

## 2018-03-09 MED ORDER — METFORMIN HCL 500 MG PO TABS: 500.0000 mg | ORAL_TABLET | Freq: Every morning | ORAL | 0 refills | Status: DC

## 2018-03-09 NOTE — Progress Notes (Signed)
Office: [phone]  /  Fax: [phone]   HPI:   Chief Complaint: OBESITY Tyler [name] is here to discuss his progress with his obesity treatment plan. He is on the Category 2 plan and is following his eating plan approximately 90 % of the time. He states he is walking at [facility] for 2 miles 7 times per week. Tyler [name] continues to do well with weight loss. He is incorporating more lean protein and states his hunger is well controlled.  His weight is 231 lb (104.8 kg) today and has had a weight loss of 1 pound over a period of 5 weeks since his last visit. He has lost 5 lbs since starting treatment with [place].  Vitamin D Deficiency Tyler [name] has a diagnosis of vitamin D deficiency. He is currently taking prescription Vit D and denies nausea, vomiting or muscle weakness.  Pre-Diabetes Tyler [name] has a diagnosis of pre-diabetes based on his elevated Hgb A1c and was informed this puts him at greater risk of developing diabetes. He is taking metformin currently and continues to work on diet and exercise to decrease risk of diabetes. He denies polyphagia, nausea or hypoglycemia.  ALLERGIES: No Known Allergies  MEDICATIONS: Current Outpatient Medications on File Prior to Visit  Medication Sig Dispense Refill  . acetaminophen (TYLENOL) 500 MG tablet Take 500 mg by mouth every 6 (six) hours as needed for mild pain.    Marland Kitchen amLODipine (NORVASC) 5 MG tablet Take 5 mg by mouth daily.    . Cholecalciferol (VITAMIN D3) 2000 UNITS TABS Take 2,000 Units by mouth daily.    . CHROMIUM PO Take 1 tablet by mouth every morning.    Marland Kitchen CRESTOR 5 MG tablet Take 5 mg by mouth at bedtime.  1  . dextromethorphan-guaiFENesin (MUCINEX DM) 30-600 MG 12hr tablet Take 1 tablet by mouth 2 (two) times daily as needed for cough.    . docusate sodium (COLACE) 100 MG capsule Take 200 mg by mouth at bedtime as needed for mild constipation.    . Melatonin 10 MG CAPS Take 1 capsule as needed by mouth. 30 capsule 0  .  multivitamin-iron-minerals-folic acid (CENTRUM) chewable tablet Chew 1 tablet by mouth daily.    . Potassium 99 MG TABS Take 99 mg by mouth daily.    . QNASL 80 MCG/ACT AERS ONE SPRAY EACH NOSTRIL TWICE DAILY FOR STUFFY NOSE OR DRAINAGE. 8.7 Inhaler 0  . triamterene-hydrochlorothiazide (MAXZIDE-25) 37.5-25 MG per tablet Take 0.5 tablets by mouth daily.      No current facility-administered medications on file prior to visit.     PAST MEDICAL HISTORY: Past Medical History:  Diagnosis Date  . Back pain   . Heartburn   . Hyperlipidemia   . Hypertension   . Insulin resistance   . Obesity     PAST SURGICAL HISTORY: Past Surgical History:  Procedure Laterality Date  . BACK SURGERY     03/14/2004  . EYE SURGERY     bil cataract  02/2013  . LUMBAR LAMINECTOMY/DECOMPRESSION MICRODISCECTOMY N/A 05/19/2015   Procedure: Lumbar four- five diskectomy;  Surgeon: Coletta Memos, MD;  Location: [facility];  Service: Neurosurgery;  Laterality: N/A;  L45 diskectomy    SOCIAL HISTORY: Social History   Tobacco Use  . Smoking status: Former Games developer  . Smokeless tobacco: Never Used  . Tobacco comment: quit smokling in '70s  Substance Use Topics  . Alcohol use: Yes    Alcohol/week: 2.4 oz    Types: 4 Shots of liquor per week  Comment: social   . Drug use: No    FAMILY HISTORY: Family History  Problem Relation Age of Onset  . Hypertension Father   . Hyperlipidemia Father   . Heart disease Father   . Obesity Father     ROS: Review of Systems  Constitutional: Positive for weight loss.  Gastrointestinal: Negative for nausea and vomiting.  Musculoskeletal:       Negative muscle weakness  Endo/Heme/Allergies:       Negative polyphagia Negative hypoglycemia    PHYSICAL EXAM: Blood pressure 126/76, pulse 66, temperature 98 F (36.7 C), height  (1.651 m), weight 231 lb (104.8 kg), SpO2 97 %. Body mass index is 38.44 kg/m. Physical Exam  Constitutional: He is oriented to  person, place, and time. He appears well-developed and well-nourished.  Cardiovascular: Normal rate.  Pulmonary/Chest: Effort normal.  Musculoskeletal: Normal range of motion.  Neurological: He is oriented to person, place, and time.  Skin: Skin is warm and dry.  Psychiatric: He has a normal mood and affect. His behavior is normal.  Vitals reviewed.   RECENT LABS AND TESTS: BMET    Component Value Date/Time   NA 141 07/10/2017 0827   K 4.4 07/10/2017 0827   CL 107 (H) 07/10/2017 0827   CO2 21 07/10/2017 0827   GLUCOSE 113 (H) 07/10/2017 0827   GLUCOSE 112 (H) 08/25/2015 1342   BUN 20 07/10/2017 0827   CREATININE 1.03 07/10/2017 0827   CALCIUM 10.4 (H) 07/10/2017 0827   GFRNONAA 74 07/10/2017 0827   GFRAA 85 07/10/2017 0827   Lab Results  Component Value Date   HGBA1C 6.1 (H) 07/10/2017   HGBA1C 6.4 (H) 04/01/2017   Lab Results  Component Value Date   INSULIN 14.0 07/10/2017   INSULIN 14.6 04/01/2017   CBC    Component Value Date/Time   WBC 5.4 04/01/2017 1010   WBC 6.6 08/25/2015 1342   RBC 4.82 04/01/2017 1010   RBC 4.69 08/25/2015 1342   HGB 14.5 04/01/2017 1010   HCT 42.7 04/01/2017 1010   PLT 271 08/25/2015 1342   MCV 89 04/01/2017 1010   MCH 30.1 04/01/2017 1010   MCH 29.9 08/25/2015 1342   MCHC 34.0 04/01/2017 1010   MCHC 33.2 08/25/2015 1342   RDW 14.1 04/01/2017 1010   LYMPHSABS 2.4 04/01/2017 1010   EOSABS 0.1 04/01/2017 1010   BASOSABS 0.0 04/01/2017 1010   Iron/TIBC/Ferritin/ %Sat No results found for: IRON, TIBC, FERRITIN, IRONPCTSAT Lipid Panel     Component Value Date/Time   CHOL 183 07/10/2017 0827   TRIG 94 07/10/2017 0827   HDL 58 07/10/2017 0827   LDLCALC 106 (H) 07/10/2017 0827   Hepatic Function Panel     Component Value Date/Time   PROT 6.3 07/10/2017 0827   ALBUMIN 4.1 07/10/2017 0827   AST 16 07/10/2017 0827   ALT 16 07/10/2017 0827   ALKPHOS 57 07/10/2017 0827   BILITOT <0.2 07/10/2017 0827      Component Value  Date/Time   TSH 1.350 04/01/2017 1010  Results for CURREN, MOHRMANN (MRN [id number]) as of 03/09/2018 17:49  Ref. Range 07/10/2017 08:27  Vitamin D, 25-Hydroxy Latest Ref Range: 30.0 - 100.0 ng/mL 52.1    ASSESSMENT AND PLAN: Vitamin D deficiency - Plan: Vitamin D, Ergocalciferol, (DRISDOL) 50000 units CAPS capsule  Prediabetes - Plan: metFORMIN (GLUCOPHAGE) 500 MG tablet  Class 2 severe obesity with serious comorbidity and body mass index (BMI) of 38.0 to 38.9 in adult, unspecified obesity type (HCC)  PLAN:  Vitamin D Deficiency Tyler [name] was informed that low vitamin D levels contributes to fatigue and are associated with obesity, breast, and colon cancer. Tyler [name] agrees to continue taking prescription Vit D ,000 IU every week #4 and we will refill for 1 month. He will follow up for routine testing of vitamin D, at least 2-3 times per year. He was informed of the risk of over-replacement of vitamin D and agrees to not increase his dose unless he discusses this with [place] first. Ancelmo agrees to follow up with our clinic in 3 weeks.  Pre-Diabetes Tyler [name] will continue to work on weight loss, exercise, and decreasing simple carbohydrates in his diet to help decrease the risk of diabetes. We dicussed metformin including benefits and risks. He was informed that eating too many simple carbohydrates or too many calories at one sitting increases the likelihood of GI side effects. Thien agrees to continue taking metformin 500 mg q AM #30 and we will refill for 1 month. Tyler [name] agrees to follow up with our clinic in 3 weeks as directed to monitor his progress.  Obesity Tyler [name] is currently in the action stage of change. As such, his goal is to continue with weight loss efforts He has agreed to follow the Category 2 plan Tyler [name] has been instructed to work up to a goal of 150 minutes of combined cardio and strengthening exercise per week for weight loss and overall health benefits. We discussed the following  Behavioral Modification Strategies today: increasing lean protein intake and decrease eating out   Tyler [name] has agreed to follow up with our clinic in 3 weeks. He was informed of the importance of frequent follow up visits to maximize his success with intensive lifestyle modifications for his multiple health conditions.   OBESITY BEHAVIORAL INTERVENTION VISIT  Today's visit was # 18 out of 22.  Starting weight: 236 lbs Starting date: 04/01/17 Today's weight : 231 lbs Today's date: 03/09/2018 Total lbs lost to date: 5 (Patients must lose 7 lbs in the first 6 months to continue with counseling)   ASK: We discussed the diagnosis of obesity with Tyler [name] today and Jaydian agreed to give [place] permission to discuss obesity behavioral modification therapy today.  ASSESS: Ottie has the diagnosis of obesity and his BMI today is 38.44 Schneur is in the action stage of change   ADVISE: Jarmaine was educated on the multiple health risks of obesity as well as the benefit of weight loss to improve his health. He was advised of the need for long term treatment and the importance of lifestyle modifications.  AGREE: Multiple dietary modification options and treatment options were discussed and  Catrell agreed to the above obesity treatment plan.   Trude Mcburney, am acting as transcriptionist for Illa Level, PA-C I, Illa Level [facility], have reviewed this note and agree with its content

## 2018-03-13 ENCOUNTER — Ambulatory Visit (INDEPENDENT_AMBULATORY_CARE_PROVIDER_SITE_OTHER): Payer: Medicare Other

## 2018-03-13 DIAGNOSIS — J309 Allergic rhinitis, unspecified: Secondary | ICD-10-CM | POA: Diagnosis not present

## 2018-03-27 ENCOUNTER — Ambulatory Visit (INDEPENDENT_AMBULATORY_CARE_PROVIDER_SITE_OTHER): Payer: Medicare Other

## 2018-03-27 DIAGNOSIS — J309 Allergic rhinitis, unspecified: Secondary | ICD-10-CM | POA: Diagnosis not present

## 2018-03-31 ENCOUNTER — Encounter (INDEPENDENT_AMBULATORY_CARE_PROVIDER_SITE_OTHER): Payer: Self-pay | Admitting: Physician Assistant

## 2018-03-31 ENCOUNTER — Ambulatory Visit (INDEPENDENT_AMBULATORY_CARE_PROVIDER_SITE_OTHER): Payer: Medicare Other | Admitting: Physician Assistant

## 2018-03-31 VITALS — BP 146/81 | HR 65 | Temp 98.1°F | Ht 65.0 in | Wt 234.0 lb

## 2018-03-31 DIAGNOSIS — Z6838 Body mass index (BMI) 38.0-38.9, adult: Secondary | ICD-10-CM

## 2018-03-31 DIAGNOSIS — E559 Vitamin D deficiency, unspecified: Secondary | ICD-10-CM

## 2018-03-31 DIAGNOSIS — I10 Essential (primary) hypertension: Secondary | ICD-10-CM | POA: Diagnosis not present

## 2018-03-31 MED ORDER — VITAMIN D (ERGOCALCIFEROL) 1.25 MG (50000 UNIT) PO CAPS: 50000.0000 [IU] | ORAL_CAPSULE | ORAL | 0 refills | Status: DC

## 2018-03-31 NOTE — Progress Notes (Signed)
Office: [phone]  /  Fax: [phone]   HPI:   Chief Complaint: OBESITY Tyler [name] is here to discuss his progress with his obesity treatment plan. He is on the Category 2 plan and is following his eating plan approximately 85 % of the time. He states he is walking and doing weights for 30 minutes 4 times per week. Tyler [name] has been incorporating more protein shakes and has ate out more. He would like travel eating strategies.  His weight is 234 lb (106.1 kg) today and has gained 3 pounds since his last visit. He has lost 2 lbs since starting treatment with [place].  Vitamin D Deficiency Tyler [name] has [name] diagnosis of vitamin D deficiency. He is currently taking prescription Vit D and denies nausea, vomiting or muscle weakness.  Hypertension Tyler [name] is [name] 72 y.o. male with hypertension. Tyler [name]'s blood pressure is elevated. He denies chest pain or shortness of breath. He declines any adjustment of his medications. He is working weight loss to help control his blood pressure with the goal of decreasing his risk of heart attack and stroke. Tyler [name]'s blood pressure is not currently controlled.  ALLERGIES: No Known Allergies  MEDICATIONS: Current Outpatient Medications on File Prior to Visit  Medication Sig Dispense Refill  . acetaminophen (TYLENOL) 500 MG tablet Take 500 mg by mouth every 6 (six) hours as needed for mild pain.    Marland Kitchen amLODipine (NORVASC) 5 MG tablet Take 5 mg by mouth daily.    . Cholecalciferol (VITAMIN D3) 2000 UNITS TABS Take 2,000 Units by mouth daily.    . CHROMIUM PO Take 1 tablet by mouth every morning.    Marland Kitchen CRESTOR 5 MG tablet Take 5 mg by mouth at bedtime.  1  . dextromethorphan-guaiFENesin (MUCINEX DM) 30-600 MG 12hr tablet Take 1 tablet by mouth 2 (two) times daily as needed for cough.    . docusate sodium (COLACE) 100 MG capsule Take 200 mg by mouth at bedtime as needed for mild constipation.    . Melatonin 10 MG CAPS Take 1 capsule as needed by mouth. 30 capsule 0   . metFORMIN (GLUCOPHAGE) 500 MG tablet Take 1 tablet (500 mg total) by mouth every morning. 30 tablet 0  . multivitamin-iron-minerals-folic acid (CENTRUM) chewable tablet Chew 1 tablet by mouth daily.    . Potassium 99 MG TABS Take 99 mg by mouth daily.    . QNASL 80 MCG/ACT AERS ONE SPRAY EACH NOSTRIL TWICE DAILY FOR STUFFY NOSE OR DRAINAGE. 8.7 Inhaler 0  . triamterene-hydrochlorothiazide (MAXZIDE-25) 37.5-25 MG per tablet Take 0.5 tablets by mouth daily.      No current facility-administered medications on file prior to visit.     PAST MEDICAL HISTORY: Past Medical History:  Diagnosis Date  . Back pain   . Heartburn   . Hyperlipidemia   . Hypertension   . Insulin resistance   . Obesity     PAST SURGICAL HISTORY: Past Surgical History:  Procedure Laterality Date  . BACK SURGERY     03/14/2004  . EYE SURGERY     bil cataract  02/2013  . LUMBAR LAMINECTOMY/DECOMPRESSION MICRODISCECTOMY N/[name] 05/19/2015   Procedure: Lumbar four- five diskectomy;  Surgeon: Coletta Memos, MD;  Location: [facility];  Service: Neurosurgery;  Laterality: N/[name];  L45 diskectomy    SOCIAL HISTORY: Social History   Tobacco Use  . Smoking status: Former Games developer  . Smokeless tobacco: Never Used  . Tobacco comment: quit smokling in '70s  Substance Use Topics  .  Alcohol use: Yes    Alcohol/week: 2.4 oz    Types: 4 Shots of liquor per week    Comment: social   . Drug use: No    FAMILY HISTORY: Family History  Problem Relation Age of Onset  . Hypertension Father   . Hyperlipidemia Father   . Heart disease Father   . Obesity Father     ROS: Review of Systems  Constitutional: Negative for weight loss.  Respiratory: Negative for shortness of breath.   Cardiovascular: Negative for chest pain.  Gastrointestinal: Negative for nausea and vomiting.  Musculoskeletal:       Negative muscle weakness    PHYSICAL EXAM: Blood pressure (!) 146/81, pulse 65, temperature 98.1 F (36.7 C), temperature  source Oral, height 5\' 5"  (1.651 m), weight 234 lb (106.1 kg), SpO2 98 %. Body mass index is 38.94 kg/m. Physical Exam  Constitutional: He is oriented to person, place, and time. He appears well-developed and well-nourished.  Cardiovascular: Normal rate.  Pulmonary/Chest: Effort normal.  Musculoskeletal: Normal range of motion.  Neurological: He is oriented to person, place, and time.  Skin: Skin is warm and dry.  Psychiatric: He has [name] normal mood and affect. His behavior is normal.  Vitals reviewed.   RECENT LABS AND TESTS: BMET    Component Value Date/Time   NA 141 07/10/2017 0827   K 4.4 07/10/2017 0827   CL 107 (H) 07/10/2017 0827   CO2 21 07/10/2017 0827   GLUCOSE 113 (H) 07/10/2017 0827   GLUCOSE 112 (H) 08/25/2015 1342   BUN 20 07/10/2017 0827   CREATININE 1.03 07/10/2017 0827   CALCIUM 10.4 (H) 07/10/2017 0827   GFRNONAA 74 07/10/2017 0827   GFRAA 85 07/10/2017 0827   Lab Results  Component Value Date   HGBA1C 6.1 (H) 07/10/2017   HGBA1C 6.4 (H) 04/01/2017   Lab Results  Component Value Date   INSULIN 14.0 07/10/2017   INSULIN 14.6 04/01/2017   CBC    Component Value Date/Time   WBC 5.4 04/01/2017 1010   WBC 6.6 08/25/2015 1342   RBC 4.82 04/01/2017 1010   RBC 4.69 08/25/2015 1342   HGB 14.5 04/01/2017 1010   HCT 42.7 04/01/2017 1010   PLT 271 08/25/2015 1342   MCV 89 04/01/2017 1010   MCH 30.1 04/01/2017 1010   MCH 29.9 08/25/2015 1342   MCHC 34.0 04/01/2017 1010   MCHC 33.2 08/25/2015 1342   RDW 14.1 04/01/2017 1010   LYMPHSABS 2.4 04/01/2017 1010   EOSABS 0.1 04/01/2017 1010   BASOSABS 0.0 04/01/2017 1010   Iron/TIBC/Ferritin/ %Sat No results found for: IRON, TIBC, FERRITIN, IRONPCTSAT Lipid Panel     Component Value Date/Time   CHOL 183 07/10/2017 0827   TRIG 94 07/10/2017 0827   HDL 58 07/10/2017 0827   LDLCALC 106 (H) 07/10/2017 0827   Hepatic Function Panel     Component Value Date/Time   PROT 6.3 07/10/2017 0827   ALBUMIN 4.1  07/10/2017 0827   AST 16 07/10/2017 0827   ALT 16 07/10/2017 0827   ALKPHOS 57 07/10/2017 0827   BILITOT <0.2 07/10/2017 0827      Component Value Date/Time   TSH 1.350 04/01/2017 1010  Results for Tyler [name], Tyler [name] (MRN [id number]) as of 03/31/2018 12:06  Ref. Range 07/10/2017 08:27  Vitamin D, 25-Hydroxy Latest Ref Range: 30.0 - 100.0 ng/mL 52.1    ASSESSMENT AND PLAN: Vitamin D deficiency - Plan: Vitamin D, Ergocalciferol, (DRISDOL) 50000 units CAPS capsule  Essential hypertension  Class  2 severe obesity with serious comorbidity and body mass index (BMI) of 38.0 to 38.9 in adult, unspecified obesity type (HCC)  PLAN:  Vitamin D Deficiency Tyler [name] was informed that low vitamin D levels contributes to fatigue and are associated with obesity, breast, and colon cancer. Jalin agrees to continue taking prescription Vit D @50 ,000 IU every week #4 and we will refill for 1 month. He will follow up for routine testing of vitamin D, at least 2-3 times per year. He was informed of the risk of over-replacement of vitamin D and agrees to not increase his dose unless he discusses this with [place] first. Tyler [name] agrees to follow up with our clinic in 2 weeks.  Hypertension We discussed sodium restriction, working on healthy weight loss, and [name] regular exercise program as the means to achieve improved blood pressure control. Tyler [name] agreed with this plan and agreed to follow up as directed. We will continue to monitor his blood pressure as well as his progress with the above lifestyle modifications. He will continue his medications and will watch for signs of hypotension as he continues his lifestyle modifications. Tyler [name] agrees to follow up with our clinic in 2 weeks.  Obesity Tyler [name] is currently in the action stage of change. As such, his goal is to continue with weight loss efforts He has agreed to portion control better and make smarter food choices, such as increase vegetables and decrease simple  carbohydrates  Tyler [name] has been instructed to work up to [name] goal of 150 minutes of combined cardio and strengthening exercise per week for weight loss and overall health benefits. We discussed the following Behavioral Modification Strategies today: travel eating strategies and celebration eating strategies   Tyler [name] has agreed to follow up with our clinic in 2 weeks. He was informed of the importance of frequent follow up visits to maximize his success with intensive lifestyle modifications for his multiple health conditions.   OBESITY BEHAVIORAL INTERVENTION VISIT  Today's visit was # 19 out of 22.  Starting weight: 236 lbs Starting date: 04/01/17 Today's weight : 234 lbs  Today's date: 03/31/2018 Total lbs lost to date: 2 (Patients must lose 7 lbs in the first 6 months to continue with counseling)   ASK: We discussed the diagnosis of obesity with Tyler [name] today and Tyler [name] agreed to give [place] permission to discuss obesity behavioral modification therapy today.  ASSESS: Tyler [name] has the diagnosis of obesity and his BMI today is 38.94 Tyler [name] is in the action stage of change   ADVISE: Tyler [name] was educated on the multiple health risks of obesity as well as the benefit of weight loss to improve his health. He was advised of the need for long term treatment and the importance of lifestyle modifications.  AGREE: Multiple dietary modification options and treatment options were discussed and  Tyler [name] agreed to the above obesity treatment plan.   Trude Mcburney, am acting as transcriptionist for Illa Level, PA-C I, Illa Level [facility], have reviewed this note and agree with its content

## 2018-04-09 ENCOUNTER — Ambulatory Visit (INDEPENDENT_AMBULATORY_CARE_PROVIDER_SITE_OTHER): Payer: Medicare Other | Admitting: *Deleted

## 2018-04-09 DIAGNOSIS — J309 Allergic rhinitis, unspecified: Secondary | ICD-10-CM

## 2018-04-14 ENCOUNTER — Encounter (INDEPENDENT_AMBULATORY_CARE_PROVIDER_SITE_OTHER): Payer: Self-pay | Admitting: Physician Assistant

## 2018-04-14 ENCOUNTER — Ambulatory Visit (INDEPENDENT_AMBULATORY_CARE_PROVIDER_SITE_OTHER): Payer: Medicare Other | Admitting: Physician Assistant

## 2018-04-14 VITALS — BP 113/72 | HR 63 | Temp 98.1°F | Ht 65.0 in | Wt 235.0 lb

## 2018-04-14 DIAGNOSIS — Z6839 Body mass index (BMI) 39.0-39.9, adult: Secondary | ICD-10-CM | POA: Diagnosis not present

## 2018-04-14 DIAGNOSIS — I10 Essential (primary) hypertension: Secondary | ICD-10-CM | POA: Diagnosis not present

## 2018-04-14 NOTE — Progress Notes (Signed)
Office: [phone]  /  Fax: [phone]   HPI:   Chief Complaint: OBESITY Tyler [name] is here to discuss his progress with his obesity treatment plan. He is on the portion control better and make smarter food choices, such as increase vegetables and decrease simple carbohydrates and is following his eating plan approximately 75 % of the time. He states he is walking and lifting weights for 30 minutes 7 times per week. Tyler [name] is retaining fluids, states he has not been taking his diuretic on vacation. He is motivated to get back on track but continues to eat out more.  His weight is 235 lb (106.6 kg) today and has gained 1 pound since his last visit. He has lost 1 lb since starting treatment with [place].  Hypertension Tyler [name] is a 72 y.o. male with hypertension. Tyler [name] blood pressure is stable and he denies chest pain or shortness of breath. He is on diuretic and has not been taking it as he was traveling out of town. He is encouraged to keep up with his blood pressure medications. He is working weight loss to help control his blood pressure with the goal of decreasing his risk of heart attack and stroke. Tyler [name] blood pressure is currently controlled.  ALLERGIES: No Known Allergies  MEDICATIONS: Current Outpatient Medications on File Prior to Visit  Medication Sig Dispense Refill  . acetaminophen (TYLENOL) 500 MG tablet Take 500 mg by mouth every 6 (six) hours as needed for mild pain.    Marland Kitchen amLODipine (NORVASC) 5 MG tablet Take 5 mg by mouth daily.    . Cholecalciferol (VITAMIN D3) 2000 UNITS TABS Take 2,000 Units by mouth daily.    . CHROMIUM PO Take 1 tablet by mouth every morning.    Marland Kitchen CRESTOR 5 MG tablet Take 5 mg by mouth at bedtime.  1  . dextromethorphan-guaiFENesin (MUCINEX DM) 30-600 MG 12hr tablet Take 1 tablet by mouth 2 (two) times daily as needed for cough.    . docusate sodium (COLACE) 100 MG capsule Take 200 mg by mouth at bedtime as needed for mild constipation.      . Melatonin 10 MG CAPS Take 1 capsule as needed by mouth. 30 capsule 0  . metFORMIN (GLUCOPHAGE) 500 MG tablet Take 1 tablet (500 mg total) by mouth every morning. 30 tablet 0  . multivitamin-iron-minerals-folic acid (CENTRUM) chewable tablet Chew 1 tablet by mouth daily.    . Potassium 99 MG TABS Take 99 mg by mouth daily.    . QNASL 80 MCG/ACT AERS ONE SPRAY EACH NOSTRIL TWICE DAILY FOR STUFFY NOSE OR DRAINAGE. 8.7 Inhaler 0  . triamterene-hydrochlorothiazide (MAXZIDE-25) 37.5-25 MG per tablet Take 0.5 tablets by mouth daily.     . Vitamin D, Ergocalciferol, (DRISDOL) 50000 units CAPS capsule Take 1 capsule (50,000 Units total) by mouth every 7 (seven) days. 2 capsule 0   No current facility-administered medications on file prior to visit.     PAST MEDICAL HISTORY: Past Medical History:  Diagnosis Date  . Back pain   . Heartburn   . Hyperlipidemia   . Hypertension   . Insulin resistance   . Obesity     PAST SURGICAL HISTORY: Past Surgical History:  Procedure Laterality Date  . BACK SURGERY     03/14/2004  . EYE SURGERY     bil cataract  02/2013  . LUMBAR LAMINECTOMY/DECOMPRESSION MICRODISCECTOMY N/A 05/19/2015   Procedure: Lumbar four- five diskectomy;  Surgeon: Coletta Memos, MD;  Location: [facility];  Service: Neurosurgery;  Laterality: N/A;  L45 diskectomy    SOCIAL HISTORY: Social History   Tobacco Use  . Smoking status: Former Games developermoker  . Smokeless tobacco: Never Used  . Tobacco comment: quit smokling in '70s  Substance Use Topics  . Alcohol use: Yes    Alcohol/week: 2.4 oz    Types: 4 Shots of liquor per week    Comment: social   . Drug use: No    FAMILY HISTORY: Family History  Problem Relation Age of Onset  . Hypertension Father   . Hyperlipidemia Father   . Heart disease Father   . Obesity Father     ROS: Review of Systems  Constitutional: Negative for weight loss.  Respiratory: Negative for shortness of breath.   Cardiovascular: Negative for  chest pain.    PHYSICAL EXAM: Blood pressure 113/72, pulse 63, temperature 98.1 F (36.7 C), temperature source Oral, height 5\' 5"  (1.651 m), weight 235 lb (106.6 kg), SpO2 99 %. Body mass index is 39.11 kg/m. Physical Exam  Constitutional: He is oriented to person, place, and time. He appears well-developed and well-nourished.  Cardiovascular: Normal rate.  Pulmonary/Chest: Effort normal.  Musculoskeletal: Normal range of motion.  Neurological: He is oriented to person, place, and time.  Skin: Skin is warm and dry.  Psychiatric: He has a normal mood and affect. His behavior is normal.  Vitals reviewed.   RECENT LABS AND TESTS: BMET    Component Value Date/Time   NA 141 07/10/2017 0827   K 4.4 07/10/2017 0827   CL 107 (H) 07/10/2017 0827   CO2 21 07/10/2017 0827   GLUCOSE 113 (H) 07/10/2017 0827   GLUCOSE 112 (H) 08/25/2015 1342   BUN 20 07/10/2017 0827   CREATININE 1.03 07/10/2017 0827   CALCIUM 10.4 (H) 07/10/2017 0827   GFRNONAA 74 07/10/2017 0827   GFRAA 85 07/10/2017 0827   Lab Results  Component Value Date   HGBA1C 6.1 (H) 07/10/2017   HGBA1C 6.4 (H) 04/01/2017   Lab Results  Component Value Date   INSULIN 14.0 07/10/2017   INSULIN 14.6 04/01/2017   CBC    Component Value Date/Time   WBC 5.4 04/01/2017 1010   WBC 6.6 08/25/2015 1342   RBC 4.82 04/01/2017 1010   RBC 4.69 08/25/2015 1342   HGB 14.5 04/01/2017 1010   HCT 42.7 04/01/2017 1010   PLT 271 08/25/2015 1342   MCV 89 04/01/2017 1010   MCH 30.1 04/01/2017 1010   MCH 29.9 08/25/2015 1342   MCHC 34.0 04/01/2017 1010   MCHC 33.2 08/25/2015 1342   RDW 14.1 04/01/2017 1010   LYMPHSABS 2.4 04/01/2017 1010   EOSABS 0.1 04/01/2017 1010   BASOSABS 0.0 04/01/2017 1010   Iron/TIBC/Ferritin/ %Sat No results found for: IRON, TIBC, FERRITIN, IRONPCTSAT Lipid Panel     Component Value Date/Time   CHOL 183 07/10/2017 0827   TRIG 94 07/10/2017 0827   HDL 58 07/10/2017 0827   LDLCALC 106 (H)  07/10/2017 0827   Hepatic Function Panel     Component Value Date/Time   PROT 6.3 07/10/2017 0827   ALBUMIN 4.1 07/10/2017 0827   AST 16 07/10/2017 0827   ALT 16 07/10/2017 0827   ALKPHOS 57 07/10/2017 0827   BILITOT <0.2 07/10/2017 0827      Component Value Date/Time   TSH 1.350 04/01/2017 1010    ASSESSMENT AND PLAN: No diagnosis found.  PLAN:  Hypertension We discussed sodium restriction, working on healthy weight loss, and a regular exercise program  as the means to achieve improved blood pressure control. Tyler [name] agreed with this plan and agreed to follow up as directed. We will continue to monitor his blood pressure as well as his progress with the above lifestyle modifications. He will continue his medications as prescribed and will watch for signs of hypotension as he continues his lifestyle modifications. Tyler [name] agrees to follow up with our clinic in 2 weeks.  We spent > than 50% of the 15 minute visit on the counseling as documented in the note.  Obesity Tyler [name] is currently in the action stage of change. As such, his goal is to continue with weight loss efforts He has agreed to portion control better and make smarter food choices, such as increase vegetables and decrease simple carbohydrates  Tyler [name] has been instructed to work up to a goal of 150 minutes of combined cardio and strengthening exercise per week for weight loss and overall health benefits. We discussed the following Behavioral Modification Strategies today: decrease eating out and work on meal planning and easy cooking plans   Tyler [name] has agreed to follow up with our clinic in 2 weeks. He was informed of the importance of frequent follow up visits to maximize his success with intensive lifestyle modifications for his multiple health conditions.   OBESITY BEHAVIORAL INTERVENTION VISIT  Today's visit was # 20 out of 22.  Starting weight: 236 lbs Starting date: 04/01/17 Today's weight : 235 lbs Today's date:  04/14/2018 Total lbs lost to date: 1 (Patients must lose 7 lbs in the first 6 months to continue with counseling)   ASK: We discussed the diagnosis of obesity with Tyler [name] today and Tyler [name] agreed to give [place] permission to discuss obesity behavioral modification therapy today.  ASSESS: Tyler [name] has the diagnosis of obesity and his BMI today is 39.11 Tyler [name] is in the action stage of change   ADVISE: Tyler [name] was educated on the multiple health risks of obesity as well as the benefit of weight loss to improve his health. He was advised of the need for long term treatment and the importance of lifestyle modifications.  AGREE: Multiple dietary modification options and treatment options were discussed and  Sage agreed to the above obesity treatment plan.   Trude Mcburney, am acting as transcriptionist for Tyler Level, PA-C I, Tyler [name] [facility], have reviewed this note and agree with its content

## 2018-04-30 ENCOUNTER — Ambulatory Visit (INDEPENDENT_AMBULATORY_CARE_PROVIDER_SITE_OTHER): Payer: Medicare Other | Admitting: *Deleted

## 2018-04-30 DIAGNOSIS — J309 Allergic rhinitis, unspecified: Secondary | ICD-10-CM | POA: Diagnosis not present

## 2018-05-04 ENCOUNTER — Ambulatory Visit (INDEPENDENT_AMBULATORY_CARE_PROVIDER_SITE_OTHER): Payer: Medicare Other | Admitting: Physician Assistant

## 2018-05-04 ENCOUNTER — Encounter (INDEPENDENT_AMBULATORY_CARE_PROVIDER_SITE_OTHER): Payer: Self-pay

## 2018-05-07 ENCOUNTER — Ambulatory Visit (INDEPENDENT_AMBULATORY_CARE_PROVIDER_SITE_OTHER): Payer: Medicare Other | Admitting: *Deleted

## 2018-05-07 DIAGNOSIS — J309 Allergic rhinitis, unspecified: Secondary | ICD-10-CM | POA: Diagnosis not present

## 2018-05-11 ENCOUNTER — Ambulatory Visit (INDEPENDENT_AMBULATORY_CARE_PROVIDER_SITE_OTHER): Payer: Medicare Other | Admitting: Physician Assistant

## 2018-05-11 ENCOUNTER — Encounter (INDEPENDENT_AMBULATORY_CARE_PROVIDER_SITE_OTHER): Payer: Self-pay

## 2018-05-14 ENCOUNTER — Ambulatory Visit (INDEPENDENT_AMBULATORY_CARE_PROVIDER_SITE_OTHER): Payer: Medicare Other | Admitting: *Deleted

## 2018-05-14 DIAGNOSIS — J309 Allergic rhinitis, unspecified: Secondary | ICD-10-CM | POA: Diagnosis not present

## 2018-05-18 ENCOUNTER — Ambulatory Visit (INDEPENDENT_AMBULATORY_CARE_PROVIDER_SITE_OTHER): Payer: Medicare Other | Admitting: Physician Assistant

## 2018-05-18 ENCOUNTER — Encounter (INDEPENDENT_AMBULATORY_CARE_PROVIDER_SITE_OTHER): Payer: Self-pay | Admitting: Physician Assistant

## 2018-05-18 VITALS — BP 137/71 | HR 57 | Temp 97.4°F | Ht 65.0 in | Wt 236.0 lb

## 2018-05-18 DIAGNOSIS — Z6839 Body mass index (BMI) 39.0-39.9, adult: Secondary | ICD-10-CM | POA: Diagnosis not present

## 2018-05-18 DIAGNOSIS — Z9189 Other specified personal risk factors, not elsewhere classified: Secondary | ICD-10-CM

## 2018-05-18 DIAGNOSIS — E559 Vitamin D deficiency, unspecified: Secondary | ICD-10-CM | POA: Diagnosis not present

## 2018-05-18 DIAGNOSIS — R7303 Prediabetes: Secondary | ICD-10-CM | POA: Diagnosis not present

## 2018-05-18 MED ORDER — VITAMIN D (ERGOCALCIFEROL) 1.25 MG (50000 UNIT) PO CAPS: 50000.0000 [IU] | ORAL_CAPSULE | ORAL | 0 refills | Status: DC

## 2018-05-18 MED ORDER — METFORMIN HCL 500 MG PO TABS: 500.0000 mg | ORAL_TABLET | Freq: Every morning | ORAL | 0 refills | Status: DC

## 2018-05-19 NOTE — Progress Notes (Signed)
Office: [phone]  /  Fax: [phone]   HPI:   Chief Complaint: OBESITY Demeco is here to discuss his progress with his obesity treatment plan. He is on the portion control better and make smarter food choices plan and is following his eating plan approximately 50 % of the time. He states he is doing weights and walking for 30 minutes 3 times per week. Shahzain has been traveling and has been struggling with his plan. He reports not eating all of the protein at dinner. He has been walking in the morning for exercise. He is ready to get back on track with his eating plan. His weight is 236 lb (107 kg) today and has had a weight gain of 1 pound over a period of 5 weeks since his last visit. He has lost 0 lbs since starting treatment with [place].  Pre-Diabetes Aldahir has a diagnosis of prediabetes based on his elevated Hgb A1c and was informed this puts him at greater risk of developing diabetes. He is taking metformin currently and continues to work on diet and exercise to decrease risk of diabetes. He denies nausea, vomiting, diarrhea or hypoglycemia.  At risk for diabetes Uchechukwu is at higher than average risk for developing diabetes due to his obesity and pre-diabetes. He currently denies polyuria or polydipsia.  Vitamin D deficiency Jaidon has a diagnosis of vitamin D deficiency. He is currently taking vit D and last level was not at goal. He denies nausea, vomiting or muscle weakness.  ALLERGIES: No Known Allergies  MEDICATIONS: Current Outpatient Medications on File Prior to Visit  Medication Sig Dispense Refill  . acetaminophen (TYLENOL) 500 MG tablet Take 500 mg by mouth every 6 (six) hours as needed for mild pain.    Marland Kitchen amLODipine (NORVASC) 5 MG tablet Take 5 mg by mouth daily.    . CHROMIUM PO Take 1 tablet by mouth every morning.    Marland Kitchen CRESTOR 5 MG tablet Take 5 mg by mouth at bedtime.  1  . dextromethorphan-guaiFENesin (MUCINEX DM) 30-600 MG 12hr tablet Take 1 tablet by mouth 2 (two)  times daily as needed for cough.    . docusate sodium (COLACE) 100 MG capsule Take 200 mg by mouth at bedtime as needed for mild constipation.    . Melatonin 10 MG CAPS Take 1 capsule as needed by mouth. 30 capsule 0  . multivitamin-iron-minerals-folic acid (CENTRUM) chewable tablet Chew 1 tablet by mouth daily.    . Potassium 99 MG TABS Take 99 mg by mouth daily.    . QNASL 80 MCG/ACT AERS ONE SPRAY EACH NOSTRIL TWICE DAILY FOR STUFFY NOSE OR DRAINAGE. 8.7 Inhaler 0  . triamterene-hydrochlorothiazide (MAXZIDE-25) 37.5-25 MG per tablet Take 0.5 tablets by mouth daily.     . Cholecalciferol (VITAMIN D3) 2000 UNITS TABS Take 2,000 Units by mouth daily.     No current facility-administered medications on file prior to visit.     PAST MEDICAL HISTORY: Past Medical History:  Diagnosis Date  . Back pain   . Heartburn   . Hyperlipidemia   . Hypertension   . Insulin resistance   . Obesity     PAST SURGICAL HISTORY: Past Surgical History:  Procedure Laterality Date  . BACK SURGERY     03/14/2004  . EYE SURGERY     bil cataract  02/2013  . LUMBAR LAMINECTOMY/DECOMPRESSION MICRODISCECTOMY N/A 05/19/2015   Procedure: Lumbar four- five diskectomy;  Surgeon: Coletta Memos, MD;  Location: [facility];  Service: Neurosurgery;  Laterality: N/A;  L45 diskectomy    SOCIAL HISTORY: Social History   Tobacco Use  . Smoking status: Former Games developer  . Smokeless tobacco: Never Used  . Tobacco comment: quit smokling in '70s  Substance Use Topics  . Alcohol use: Yes    Alcohol/week: 2.4 oz    Types: 4 Shots of liquor per week    Comment: social   . Drug use: No    FAMILY HISTORY: Family History  Problem Relation Age of Onset  . Hypertension Father   . Hyperlipidemia Father   . Heart disease Father   . Obesity Father     ROS: Review of Systems  Constitutional: Negative for weight loss.  Gastrointestinal: Negative for diarrhea, nausea and vomiting.  Genitourinary: Negative for frequency.    Musculoskeletal:       Negative for muscle weakness  Endo/Heme/Allergies: Negative for polydipsia.       Negative for hypoglycemia    PHYSICAL EXAM: Blood pressure 137/71, pulse (!) 57, temperature (!) 97.4 F (36.3 C), temperature source Oral, height 5\' 5"  (1.651 m), weight 236 lb (107 kg), SpO2 99 %. Body mass index is 39.27 kg/m. Physical Exam  Constitutional: He is oriented to person, place, and time. He appears well-developed and well-nourished.  Cardiovascular: Normal rate.  Pulmonary/Chest: Effort normal.  Musculoskeletal: Normal range of motion.  Neurological: He is oriented to person, place, and time.  Skin: Skin is warm and dry.  Psychiatric: He has a normal mood and affect. His behavior is normal.  Vitals reviewed.   RECENT LABS AND TESTS: BMET    Component Value Date/Time   NA 141 07/10/2017 0827   K 4.4 07/10/2017 0827   CL 107 (H) 07/10/2017 0827   CO2 21 07/10/2017 0827   GLUCOSE 113 (H) 07/10/2017 0827   GLUCOSE 112 (H) 08/25/2015 1342   BUN 20 07/10/2017 0827   CREATININE 1.03 07/10/2017 0827   CALCIUM 10.4 (H) 07/10/2017 0827   GFRNONAA 74 07/10/2017 0827   GFRAA 85 07/10/2017 0827   Lab Results  Component Value Date   HGBA1C 6.1 (H) 07/10/2017   HGBA1C 6.4 (H) 04/01/2017   Lab Results  Component Value Date   INSULIN 14.0 07/10/2017   INSULIN 14.6 04/01/2017   CBC    Component Value Date/Time   WBC 5.4 04/01/2017 1010   WBC 6.6 08/25/2015 1342   RBC 4.82 04/01/2017 1010   RBC 4.69 08/25/2015 1342   HGB 14.5 04/01/2017 1010   HCT 42.7 04/01/2017 1010   PLT 271 08/25/2015 1342   MCV 89 04/01/2017 1010   MCH 30.1 04/01/2017 1010   MCH 29.9 08/25/2015 1342   MCHC 34.0 04/01/2017 1010   MCHC 33.2 08/25/2015 1342   RDW 14.1 04/01/2017 1010   LYMPHSABS 2.4 04/01/2017 1010   EOSABS 0.1 04/01/2017 1010   BASOSABS 0.0 04/01/2017 1010   Iron/TIBC/Ferritin/ %Sat No results found for: IRON, TIBC, FERRITIN, IRONPCTSAT Lipid Panel      Component Value Date/Time   CHOL 183 07/10/2017 0827   TRIG 94 07/10/2017 0827   HDL 58 07/10/2017 0827   LDLCALC 106 (H) 07/10/2017 0827   Hepatic Function Panel     Component Value Date/Time   PROT 6.3 07/10/2017 0827   ALBUMIN 4.1 07/10/2017 0827   AST 16 07/10/2017 0827   ALT 16 07/10/2017 0827   ALKPHOS 57 07/10/2017 0827   BILITOT <0.2 07/10/2017 0827      Component Value Date/Time   TSH 1.350 04/01/2017 1010  Results for [name], Hutson A (MRN [id number]) as of 05/19/2018 16:46  Ref. Range 07/10/2017 08:27  Vitamin D, 25-Hydroxy Latest Ref Range: 30.0 - 100.0 ng/mL 52.1   ASSESSMENT AND PLAN: Prediabetes - Plan: metFORMIN (GLUCOPHAGE) 500 MG tablet  Vitamin D deficiency - Plan: Vitamin D, Ergocalciferol, (DRISDOL) 50000 units CAPS capsule  At risk for diabetes mellitus  Class 2 severe obesity with serious comorbidity and body mass index (BMI) of 39.0 to 39.9 in adult, unspecified obesity type (HCC)  PLAN:  Pre-Diabetes [name] will continue to work on weight loss, exercise, and decreasing simple carbohydrates in his diet to help decrease the risk of diabetes. We dicussed metformin including benefits and risks. He was informed that eating too many simple carbohydrates or too many calories at one sitting increases the likelihood of GI side effects. [name] requested metformin for now and a prescription was written today for 1 month refill. We will recheck insulin level at the next visit. [name] agreed to follow up with us as directed to monitor his progress.  Diabetes risk counseling [name] was given extended (15 minutes) diabetes prevention counseling today. He is 72 y.o. male and has risk factors for diabetes including obesity and pre-diabetes. We discussed intensive lifestyle modifications today with an emphasis on weight loss as well as increasing exercise and decreasing simple carbohydrates in his diet.  Vitamin D Deficiency [name] was informed that low vitamin D  levels contributes to fatigue and are associated with obesity, breast, and colon cancer. He agrees to continue to take prescription Vit D @50 ,000 IU every week #4 with no refills. We will recheck vitamin D level at the next visit and he will follow up for routine testing of vitamin D, at least 2-3 times per year. He was informed of the risk of over-replacement of vitamin D and agrees to not increase his dose unless he discusses this with us first. [name] agrees to follow up as directed.  Obesity [name] is currently in the action stage of change. As such, his goal is to continue with weight loss efforts He has agreed to follow the Category 2 plan [name] has been instructed to work up to a goal of 150 minutes of combined cardio and strengthening exercise per week for weight loss and overall health benefits. We discussed the following Behavioral Modification Strategies today: increasing lean protein intake and decreasing simple carbohydrates   [name] has agreed to follow up with our clinic in 2 weeks. He was informed of the importance of frequent follow up visits to maximize his success with intensive lifestyle modifications for his multiple health conditions.   OBESITY BEHAVIORAL INTERVENTION VISIT  Today's visit was # 21 out of 22.  Starting weight: 236 lbs Starting date: 04/01/17 Today's weight : 236 lbs Today's date: 05/18/2018 Total lbs lost to date: 0    ASK: We discussed the diagnosis of obesity with Rudy JewJames A Clagg today and [name] agreed to give us permission to discuss obesity behavioral modification therapy today.  ASSESS: [name] has the diagnosis of obesity and his BMI today is 39.11 [name] is in the action stage of change   ADVISE: [name] was educated on the multiple health risks of obesity as well as the benefit of weight loss to improve his health. He was advised of the need for long term treatment and the importance of lifestyle modifications.  AGREE: Multiple dietary  modification options and treatment options were discussed and  [name] agreed to the above obesity treatment plan.  Cristi LoronI, Joanne Murray,  am acting as transcriptionist for Alois Cliche, PA-C I, Alois Cliche, PA-C have reviewed above note and agree with its content '

## 2018-05-28 ENCOUNTER — Ambulatory Visit (INDEPENDENT_AMBULATORY_CARE_PROVIDER_SITE_OTHER): Payer: Medicare Other | Admitting: *Deleted

## 2018-05-28 DIAGNOSIS — J309 Allergic rhinitis, unspecified: Secondary | ICD-10-CM

## 2018-06-01 ENCOUNTER — Ambulatory Visit (INDEPENDENT_AMBULATORY_CARE_PROVIDER_SITE_OTHER): Payer: Medicare Other | Admitting: Physician Assistant

## 2018-06-01 ENCOUNTER — Encounter (INDEPENDENT_AMBULATORY_CARE_PROVIDER_SITE_OTHER): Payer: Self-pay | Admitting: Physician Assistant

## 2018-06-01 VITALS — BP 165/90 | HR 53 | Temp 98.6°F | Ht 65.0 in | Wt 237.0 lb

## 2018-06-01 DIAGNOSIS — Z6839 Body mass index (BMI) 39.0-39.9, adult: Secondary | ICD-10-CM | POA: Diagnosis not present

## 2018-06-01 DIAGNOSIS — E559 Vitamin D deficiency, unspecified: Secondary | ICD-10-CM | POA: Diagnosis not present

## 2018-06-01 NOTE — Progress Notes (Signed)
Office: [phone]  /  Fax: [phone]   HPI:   Chief Complaint: OBESITY Tyler [name] is here to discuss his progress with his obesity treatment plan. He is on the Category 2 plan and is following his eating plan approximately 85 % of the time. He states he is exercising 0 minutes 0 times per week. Tyler [name] reports that he had trouble getting in all of the protein, especially at dinner. He also reports eating out periodically. He is ready to get back on track.  His weight is 237 lb (107.5 kg) today and has gained 1 pound since his last visit. He has lost 0 lbs since starting treatment with us.  Vitamin D Deficiency Tyler [name] has a diagnosis of vitamin D deficiency. He is on prescription Vit D, last level not at goal. He denies nausea, vomiting or muscle weakness.  ALLERGIES: No Known Allergies  MEDICATIONS: Current Outpatient Medications on File Prior to Visit  Medication Sig Dispense Refill  . acetaminophen (TYLENOL) 500 MG tablet Take 500 mg by mouth every 6 (six) hours as needed for mild pain.    Marland Kitchen. amLODipine (NORVASC) 5 MG tablet Take 5 mg by mouth daily.    . Cholecalciferol (VITAMIN D3) 2000 UNITS TABS Take 2,000 Units by mouth daily.    . CHROMIUM PO Take 1 tablet by mouth every morning.    Marland Kitchen. CRESTOR 5 MG tablet Take 5 mg by mouth at bedtime.  1  . dextromethorphan-guaiFENesin (MUCINEX DM) 30-600 MG 12hr tablet Take 1 tablet by mouth 2 (two) times daily as needed for cough.    . docusate sodium (COLACE) 100 MG capsule Take 200 mg by mouth at bedtime as needed for mild constipation.    . Melatonin 10 MG CAPS Take 1 capsule as needed by mouth. 30 capsule 0  . metFORMIN (GLUCOPHAGE) 500 MG tablet Take 1 tablet (500 mg total) by mouth every morning. 30 tablet 0  . multivitamin-iron-minerals-folic acid (CENTRUM) chewable tablet Chew 1 tablet by mouth daily.    . Potassium 99 MG TABS Take 99 mg by mouth daily.    . QNASL 80 MCG/ACT AERS ONE SPRAY EACH NOSTRIL TWICE DAILY FOR STUFFY NOSE OR  DRAINAGE. 8.7 Inhaler 0  . triamterene-hydrochlorothiazide (MAXZIDE-25) 37.5-25 MG per tablet Take 0.5 tablets by mouth daily.     . Vitamin D, Ergocalciferol, (DRISDOL) 50000 units CAPS capsule Take 1 capsule (50,000 Units total) by mouth every 7 (seven) days. 4 capsule 0   No current facility-administered medications on file prior to visit.     PAST MEDICAL HISTORY: Past Medical History:  Diagnosis Date  . Back pain   . Heartburn   . Hyperlipidemia   . Hypertension   . Insulin resistance   . Obesity     PAST SURGICAL HISTORY: Past Surgical History:  Procedure Laterality Date  . BACK SURGERY     03/14/2004  . EYE SURGERY     bil cataract  02/2013  . LUMBAR LAMINECTOMY/DECOMPRESSION MICRODISCECTOMY N/A 05/19/2015   Procedure: Lumbar four- five diskectomy;  Surgeon: Coletta MemosKyle Cabbell, MD;  Location: [facility];  Service: Neurosurgery;  Laterality: N/A;  L45 diskectomy    SOCIAL HISTORY: Social History   Tobacco Use  . Smoking status: Former Games developermoker  . Smokeless tobacco: Never Used  . Tobacco comment: quit smokling in '70s  Substance Use Topics  . Alcohol use: Yes    Alcohol/week: 4.0 standard drinks    Types: 4 Shots of liquor per week    Comment: social   .  Drug use: No    FAMILY HISTORY: Family History  Problem Relation Age of Onset  . Hypertension Father   . Hyperlipidemia Father   . Heart disease Father   . Obesity Father     ROS: Review of Systems  Constitutional: Negative for weight loss.  Gastrointestinal: Negative for nausea and vomiting.  Musculoskeletal:       Negative muscle weakness    PHYSICAL EXAM: Blood pressure (!) 165/90, pulse (!) 53, temperature 98.6 F (37 C), temperature source Oral, height 5\' 5"  (1.651 m), weight 237 lb (107.5 kg), SpO2 97 %. Body mass index is 39.44 kg/m. Physical Exam  Constitutional: He is oriented to person, place, and time. He appears well-developed and well-nourished.  Cardiovascular: Normal rate and regular  rhythm.  Pulmonary/Chest: Effort normal.  Musculoskeletal: Normal range of motion.  Neurological: He is oriented to person, place, and time.  Skin: Skin is warm and dry.  Psychiatric: He has a normal mood and affect. His behavior is normal.  Vitals reviewed.   RECENT LABS AND TESTS: BMET    Component Value Date/Time   NA 141 07/10/2017 0827   K 4.4 07/10/2017 0827   CL 107 (H) 07/10/2017 0827   CO2 21 07/10/2017 0827   GLUCOSE 113 (H) 07/10/2017 0827   GLUCOSE 112 (H) 08/25/2015 1342   BUN 20 07/10/2017 0827   CREATININE 1.03 07/10/2017 0827   CALCIUM 10.4 (H) 07/10/2017 0827   GFRNONAA 74 07/10/2017 0827   GFRAA 85 07/10/2017 0827   Lab Results  Component Value Date   HGBA1C 6.1 (H) 07/10/2017   HGBA1C 6.4 (H) 04/01/2017   Lab Results  Component Value Date   INSULIN 14.0 07/10/2017   INSULIN 14.6 04/01/2017   CBC    Component Value Date/Time   WBC 5.4 04/01/2017 1010   WBC 6.6 08/25/2015 1342   RBC 4.82 04/01/2017 1010   RBC 4.69 08/25/2015 1342   HGB 14.5 04/01/2017 1010   HCT 42.7 04/01/2017 1010   PLT 271 08/25/2015 1342   MCV 89 04/01/2017 1010   MCH 30.1 04/01/2017 1010   MCH 29.9 08/25/2015 1342   MCHC 34.0 04/01/2017 1010   MCHC 33.2 08/25/2015 1342   RDW 14.1 04/01/2017 1010   LYMPHSABS 2.4 04/01/2017 1010   EOSABS 0.1 04/01/2017 1010   BASOSABS 0.0 04/01/2017 1010   Iron/TIBC/Ferritin/ %Sat No results found for: IRON, TIBC, FERRITIN, IRONPCTSAT Lipid Panel     Component Value Date/Time   CHOL 183 07/10/2017 0827   TRIG 94 07/10/2017 0827   HDL 58 07/10/2017 0827   LDLCALC 106 (H) 07/10/2017 0827   Hepatic Function Panel     Component Value Date/Time   PROT 6.3 07/10/2017 0827   ALBUMIN 4.1 07/10/2017 0827   AST 16 07/10/2017 0827   ALT 16 07/10/2017 0827   ALKPHOS 57 07/10/2017 0827   BILITOT <0.2 07/10/2017 0827      Component Value Date/Time   TSH 1.350 04/01/2017 1010  Results for KEILAND, PICKERING (MRN [id number]) as of  06/01/2018 12:48  Ref. Range 07/10/2017 08:27  Vitamin D, 25-Hydroxy Latest Ref Range: 30.0 - 100.0 ng/mL 52.1    ASSESSMENT AND PLAN: Vitamin D deficiency  Class 2 severe obesity with serious comorbidity and body mass index (BMI) of 39.0 to 39.9 in adult, unspecified obesity type (HCC)  PLAN:  Vitamin D Deficiency Tyler [name] was informed that low vitamin D levels contributes to fatigue and are associated with obesity, breast, and colon cancer. Goro agrees to  continue taking prescription Vit D @50 ,000 IU every week and will follow up for routine testing of vitamin D, at least 2-3 times per year. He was informed of the risk of over-replacement of vitamin D and agrees to not increase his dose unless he discusses this with us first. Tyler [name] agrees to follow up with our clinic in 3 weeks.  We spent > than 50% of the 15 minute visit on the counseling as documented in the note.  Obesity Tyler [name] is currently in the action stage of change. As such, his goal is to continue with weight loss efforts He has agreed to follow the Category 2 plan Tyler [name] has been instructed to work up to a goal of 150 minutes of combined cardio and strengthening exercise per week for weight loss and overall health benefits. We discussed the following Behavioral Modification Strategies today: increasing lean protein intake and no skipping meals   Tyler [name] has agreed to follow up with our clinic in 3 weeks. He was informed of the importance of frequent follow up visits to maximize his success with intensive lifestyle modifications for his multiple health conditions.   OBESITY BEHAVIORAL INTERVENTION VISIT  Today's visit was # 22 out of 22.  Starting weight: 236 lbs Starting date: 04/01/17 Today's weight : 237 lbs Today's date: 06/01/2018 Total lbs lost to date: 0    ASK: We discussed the diagnosis of obesity with Rudy JewJames A Haring today and Tyler [name] agreed to give us permission to discuss obesity behavioral modification  therapy today.  ASSESS: Tyler [name] has the diagnosis of obesity and his BMI today is 39.44 Tyler [name] is in the action stage of change   ADVISE: Tyler [name] was educated on the multiple health risks of obesity as well as the benefit of weight loss to improve his health. He was advised of the need for long term treatment and the importance of lifestyle modifications.  AGREE: Multiple dietary modification options and treatment options were discussed and  Tyler [name] agreed to the above obesity treatment plan.  Trude McburneyI, Sharon Martin, am acting as transcriptionist for Alois Clicheracey Aralynn Brake, PA-C I, Alois Clicheracey Reshaun Briseno, PA-C have reviewed above note and agree with its content

## 2018-06-11 ENCOUNTER — Ambulatory Visit (INDEPENDENT_AMBULATORY_CARE_PROVIDER_SITE_OTHER): Payer: Medicare Other | Admitting: *Deleted

## 2018-06-11 DIAGNOSIS — J309 Allergic rhinitis, unspecified: Secondary | ICD-10-CM

## 2018-06-17 DIAGNOSIS — J3089 Other allergic rhinitis: Secondary | ICD-10-CM

## 2018-06-17 NOTE — Progress Notes (Signed)
Vials exp 06-18-19 

## 2018-06-18 DIAGNOSIS — J301 Allergic rhinitis due to pollen: Secondary | ICD-10-CM

## 2018-06-19 DIAGNOSIS — J3081 Allergic rhinitis due to animal (cat) (dog) hair and dander: Secondary | ICD-10-CM

## 2018-06-23 ENCOUNTER — Encounter (INDEPENDENT_AMBULATORY_CARE_PROVIDER_SITE_OTHER): Payer: Self-pay

## 2018-06-23 ENCOUNTER — Ambulatory Visit (INDEPENDENT_AMBULATORY_CARE_PROVIDER_SITE_OTHER): Payer: Self-pay | Admitting: Physician Assistant

## 2018-06-25 ENCOUNTER — Ambulatory Visit (INDEPENDENT_AMBULATORY_CARE_PROVIDER_SITE_OTHER): Payer: Medicare Other | Admitting: *Deleted

## 2018-06-25 DIAGNOSIS — J309 Allergic rhinitis, unspecified: Secondary | ICD-10-CM | POA: Diagnosis not present

## 2018-07-10 ENCOUNTER — Ambulatory Visit (INDEPENDENT_AMBULATORY_CARE_PROVIDER_SITE_OTHER): Payer: Medicare Other

## 2018-07-10 DIAGNOSIS — J309 Allergic rhinitis, unspecified: Secondary | ICD-10-CM | POA: Diagnosis not present

## 2018-07-24 ENCOUNTER — Ambulatory Visit (INDEPENDENT_AMBULATORY_CARE_PROVIDER_SITE_OTHER): Payer: Medicare Other

## 2018-07-24 DIAGNOSIS — J309 Allergic rhinitis, unspecified: Secondary | ICD-10-CM

## 2018-08-07 ENCOUNTER — Ambulatory Visit (INDEPENDENT_AMBULATORY_CARE_PROVIDER_SITE_OTHER): Payer: Medicare Other | Admitting: *Deleted

## 2018-08-07 DIAGNOSIS — J309 Allergic rhinitis, unspecified: Secondary | ICD-10-CM

## 2018-08-14 ENCOUNTER — Ambulatory Visit (INDEPENDENT_AMBULATORY_CARE_PROVIDER_SITE_OTHER): Payer: Medicare Other

## 2018-08-14 DIAGNOSIS — J309 Allergic rhinitis, unspecified: Secondary | ICD-10-CM | POA: Diagnosis not present

## 2018-09-03 ENCOUNTER — Ambulatory Visit (INDEPENDENT_AMBULATORY_CARE_PROVIDER_SITE_OTHER): Payer: Medicare Other | Admitting: *Deleted

## 2018-09-03 DIAGNOSIS — J309 Allergic rhinitis, unspecified: Secondary | ICD-10-CM

## 2018-09-10 ENCOUNTER — Ambulatory Visit (INDEPENDENT_AMBULATORY_CARE_PROVIDER_SITE_OTHER): Payer: Medicare Other | Admitting: *Deleted

## 2018-09-10 DIAGNOSIS — J309 Allergic rhinitis, unspecified: Secondary | ICD-10-CM

## 2018-10-08 ENCOUNTER — Ambulatory Visit (INDEPENDENT_AMBULATORY_CARE_PROVIDER_SITE_OTHER): Payer: Medicare Other | Admitting: *Deleted

## 2018-10-08 DIAGNOSIS — J309 Allergic rhinitis, unspecified: Secondary | ICD-10-CM

## 2018-10-20 ENCOUNTER — Ambulatory Visit (INDEPENDENT_AMBULATORY_CARE_PROVIDER_SITE_OTHER): Payer: Medicare Other | Admitting: *Deleted

## 2018-10-20 DIAGNOSIS — J309 Allergic rhinitis, unspecified: Secondary | ICD-10-CM | POA: Diagnosis not present

## 2018-11-18 ENCOUNTER — Other Ambulatory Visit: Payer: Self-pay | Admitting: Internal Medicine

## 2018-11-18 ENCOUNTER — Ambulatory Visit
Admission: RE | Admit: 2018-11-18 | Discharge: 2018-11-18 | Disposition: A | Discharge status: Home / Self Care | Payer: Medicare Other | Source: Ambulatory Visit | Attending: Internal Medicine | Admitting: Internal Medicine

## 2018-11-18 ENCOUNTER — Ambulatory Visit (INDEPENDENT_AMBULATORY_CARE_PROVIDER_SITE_OTHER): Payer: Medicare Other | Admitting: *Deleted

## 2018-11-18 DIAGNOSIS — J309 Allergic rhinitis, unspecified: Secondary | ICD-10-CM

## 2018-11-18 DIAGNOSIS — R05 Cough: Secondary | ICD-10-CM

## 2018-11-18 DIAGNOSIS — M25551 Pain in right hip: Secondary | ICD-10-CM

## 2018-11-18 DIAGNOSIS — R059 Cough, unspecified: Secondary | ICD-10-CM

## 2018-12-01 ENCOUNTER — Telehealth: Payer: Self-pay

## 2018-12-01 ENCOUNTER — Ambulatory Visit (INDEPENDENT_AMBULATORY_CARE_PROVIDER_SITE_OTHER): Payer: Medicare Other | Admitting: *Deleted

## 2018-12-01 DIAGNOSIS — J309 Allergic rhinitis, unspecified: Secondary | ICD-10-CM | POA: Diagnosis not present

## 2018-12-01 NOTE — Telephone Encounter (Signed)
I received a fax asking for a refill on lisinopril-hctz 20-25 mg, I do not see this in the pt's chart or on med list, please advise??

## 2018-12-02 NOTE — Telephone Encounter (Signed)
He was changed from [facility] to Lisinopril HCT at his last office visit. It is in [employer]. Please refill.

## 2018-12-03 ENCOUNTER — Other Ambulatory Visit: Payer: Self-pay

## 2018-12-03 DIAGNOSIS — I10 Essential (primary) hypertension: Secondary | ICD-10-CM

## 2018-12-03 MED ORDER — LISINOPRIL-HYDROCHLOROTHIAZIDE 20-25 MG PO TABS: 1.0000 | ORAL_TABLET | Freq: Every day | ORAL | 1 refills | Status: DC

## 2018-12-03 NOTE — Telephone Encounter (Signed)
Lisinopril-hctz sent to pharmacy

## 2018-12-09 DIAGNOSIS — J301 Allergic rhinitis due to pollen: Secondary | ICD-10-CM

## 2018-12-09 NOTE — Progress Notes (Signed)
VIALS EXP 12-10-2019 

## 2018-12-10 DIAGNOSIS — J3089 Other allergic rhinitis: Secondary | ICD-10-CM

## 2018-12-11 DIAGNOSIS — J3081 Allergic rhinitis due to animal (cat) (dog) hair and dander: Secondary | ICD-10-CM | POA: Diagnosis not present

## 2018-12-15 ENCOUNTER — Ambulatory Visit (INDEPENDENT_AMBULATORY_CARE_PROVIDER_SITE_OTHER): Payer: Medicare Other | Admitting: *Deleted

## 2018-12-15 DIAGNOSIS — J309 Allergic rhinitis, unspecified: Secondary | ICD-10-CM | POA: Diagnosis not present

## 2019-01-01 ENCOUNTER — Ambulatory Visit (INDEPENDENT_AMBULATORY_CARE_PROVIDER_SITE_OTHER): Payer: Medicare Other

## 2019-01-01 DIAGNOSIS — J309 Allergic rhinitis, unspecified: Secondary | ICD-10-CM | POA: Diagnosis not present

## 2019-01-01 NOTE — Progress Notes (Signed)
Subjective:  Primary Physician:  Rogers Blocker, MD  Patient ID: Tyler [name], male    DOB: 09/21/1946, 73 y.o.   MRN: [id number]  Chief Complaint  Patient presents with   Hypertension   Follow-up    6 mth fu, lab results,last EKG 03/2018     HPI: Tyler [name]  is a 73 y.o. male  with history of hypertension, prediabetes, morbid obesity and strong family history of premature coronary artery disease and sudden cardiac death in his brother at age 8 years presents for f/u hypertension and hyperlipidemia.   Nuclear stress test on 05/22/2018 essentially revealed no evidence of ischemia with EF of 49%, low risk and echocardiogram revealed normal LV systolic function with grade 1 diastolic dysfunction.  He now presents here for a six-month office visit, states that he is been doing well and essentially remains asymptomatic except for chronic dyspnea.  No leg edema, no PND or orthopnea.  States that his blood pressure is well controlled.  Past Medical History:  Diagnosis Date   Back pain    Heartburn    Hyperlipidemia    Hypertension    Insulin resistance    Obesity     Past Surgical History:  Procedure Laterality Date   BACK SURGERY     03/14/2004   EYE SURGERY     bil cataract  02/2013   LUMBAR LAMINECTOMY/DECOMPRESSION MICRODISCECTOMY N/A 05/19/2015   Procedure: Lumbar four- five diskectomy;  Surgeon: Ashok Pall, MD;  Location: [facility];  Service: Neurosurgery;  Laterality: N/A;  L45 diskectomy    Social History   Socioeconomic History   Marital status: Married    Spouse name: Baldo Ash   Number of children: 2   Years of education: Not on file   Highest education level: Not on file  Occupational History   Occupation: Medical illustrator  Social Needs   Financial resource strain: Not on file   Food insecurity:    Worry: Not on file    Inability: Not on file   Transportation needs:    Medical: Not on file    Non-medical: Not on file    Tobacco Use   Smoking status: Former Smoker   Smokeless tobacco: Never Used   Tobacco comment: quit smokling in '70s  Substance and Sexual Activity   Alcohol use: Yes    Alcohol/week: 4.0 standard drinks    Types: 4 Shots of liquor per week    Comment: social    Drug use: No   Sexual activity: Not on file  Lifestyle   Physical activity:    Days per week: Not on file    Minutes per session: Not on file   Stress: Not on file  Relationships   Social connections:    Talks on phone: Not on file    Gets together: Not on file    Attends religious service: Not on file    Active member of club or organization: Not on file    Attends meetings of clubs or organizations: Not on file    Relationship status: Not on file   Intimate partner violence:    Fear of current or ex partner: Not on file    Emotionally abused: Not on file    Physically abused: Not on file    Forced sexual activity: Not on file  Other Topics Concern   Not on file  Social History Narrative   Not on file    Current Outpatient Medications on File Prior  to Visit  Medication Sig Dispense Refill   acetaminophen (TYLENOL) 500 MG tablet Take 500 mg by mouth every 6 (six) hours as needed for mild pain.     amLODipine (NORVASC) 5 MG tablet Take 5 mg by mouth daily.     Cholecalciferol (VITAMIN D3) 2000 UNITS TABS Take 2,000 Units by mouth daily.     dextromethorphan-guaiFENesin (MUCINEX DM) 30-600 MG 12hr tablet Take 1 tablet by mouth 2 (two) times daily as needed for cough.     docusate sodium (COLACE) 100 MG capsule Take 200 mg by mouth at bedtime as needed for mild constipation.     metFORMIN (GLUCOPHAGE) 500 MG tablet Take 1 tablet (500 mg total) by mouth every morning. 30 tablet 0   QNASL 80 MCG/ACT AERS ONE SPRAY EACH NOSTRIL TWICE DAILY FOR STUFFY NOSE OR DRAINAGE. 8.7 Inhaler 0   triamterene-hydrochlorothiazide (MAXZIDE-25) 37.5-25 MG per tablet Take 0.5 tablets by mouth daily.      Vitamin D,  Ergocalciferol, (DRISDOL) 50000 units CAPS capsule Take 1 capsule (50,000 Units total) by mouth every 7 (seven) days. 4 capsule 0   CHROMIUM PO Take 1 tablet by mouth every morning.     Melatonin 10 MG CAPS Take 1 capsule as needed by mouth. (Patient not taking: Reported on 01/04/2019) 30 capsule 0   multivitamin-iron-minerals-folic acid (CENTRUM) chewable tablet Chew 1 tablet by mouth daily.     Potassium 99 MG TABS Take 99 mg by mouth daily.     No current facility-administered medications on file prior to visit.     Review of Systems  Constitutional: Negative for malaise/fatigue and weight loss.  Respiratory: Positive for shortness of breath (Decreased exercise tolerance). Negative for cough and hemoptysis.   Cardiovascular: Negative for chest pain, palpitations, claudication and leg swelling.  Gastrointestinal: Negative for abdominal pain, blood in stool, constipation, heartburn and vomiting.  Genitourinary: Negative for dysuria.  Musculoskeletal: Positive for back pain (chronic) and joint pain (hips). Negative for myalgias.  Neurological: Negative for dizziness, focal weakness and headaches.  Endo/Heme/Allergies: Does not bruise/bleed easily.  Psychiatric/Behavioral: Negative for depression. The patient is not nervous/anxious.   All other systems reviewed and are negative.      Objective:  Blood pressure 139/74, pulse (!) 58, height 5' 6"  (1.676 m), weight 242 lb 12.8 oz (110.1 kg), SpO2 97 %. Body mass index is 39.19 kg/m.  Physical Exam  Constitutional: He appears well-developed and well-nourished. No distress.  Moderately obese  HENT:  Head: Atraumatic.  Eyes: Conjunctivae are normal.  Neck: Neck supple. No JVD present. No thyromegaly present.  Cardiovascular: Normal rate, regular rhythm, normal heart sounds and intact distal pulses. Exam reveals no gallop.  No murmur heard. Pulmonary/Chest: Effort normal and breath sounds normal.  Abdominal: Soft. Bowel sounds are  normal.  Musculoskeletal: Normal range of motion.        General: No edema.  Neurological: He is alert.  Skin: Skin is warm and dry.  Psychiatric: He has a normal mood and affect.    CARDIAC STUDIES:   Echocardiogram 06/05/2018: Left ventricle cavity is normal in size. Moderate concentric hypertrophy of the left ventricle. Normal global wall motion. Doppler evidence of grade I (impaired) diastolic dysfunction, normal LAP. Calculated EF 55%. Left atrial cavity is mildly dilated. No significant valvular abnormality. Inadequate tricuspid regurgitation jet to estimate pulmonary artery pressure. Normal right atrial pressure.  Lexiscan myoview stress test 05/22/2018: 1. Lexiscan stress test was performed. Exercise capacity was not assessed. Stress symptoms included dizziness. Resting  and peak effect blood pressure elevated, reaching 200/78 mmHg. The stress electrocardiogram showed sinus tachycardia, normal stress conduction, no stress arrhythmias and normal stress repolarization. However, stress EKG is non diagnostic for ischemia as it is a pharmacologic stress. 2. The overall quality of the study is good. Left ventricular cavity is noted to be normal on the rest and stress studies. Gated SPECT images reveal normal myocardial thickening and wall motion. The left ventricular ejection fraction was calculated to be 49%, although visually appears normal. Small area of decreased perfusion uptake in basal inferior myocardium on both rest and stress SPECT images may represent diaphragmatic attenuation. 3. Low risk study. Echocardiogram 06/05/2018: Left ventricle cavity is normal in size. Moderate concentric hypertrophy of the left ventricle. Normal global wall motion. Doppler evidence of grade I (impaired) diastolic dysfunction, normal LAP. Calculated EF 55%. Left atrial cavity is mildly dilated. No significant valvular abnormality. Inadequate tricuspid regurgitation jet to estimate pulmonary artery pressure.  Normal right atrial pressure.  Recent Labs:    04/21/2018: Cholesterol 217, HDL 59, triglycerides 153, LDL 131. Creatinine 1.03, EGFR 85, potassium 4.1, CMP normal. Hemoglobin A1c 6.2%. TSH normal.   Assessment & Recommendations:   Essential hypertension - Plan: EKG 12-Lead, lisinopril-hydrochlorothiazide (PRINZIDE,ZESTORETIC) 20-25 MG tablet  Hypercholesteremia - Plan: rosuvastatin (CRESTOR) 20 MG tablet, Obtain medical records   EKG 03/60/2020: Sinus bradycardia at the rate of 57 bpm, left atrial enlargement, otherwise normal EKG. No significant change from  EKG 05/07/2018.   PCP EKG new T wave abnormality now not present.    Recommendation:   Patient is an AAM with history of hypertension, prediabetes, morbid obesity and strong family history of premature coronary artery disease and sudden cardiac death in his brother at age 83 years, evaluated by me for abnormal EKG.  He was previously on 5 mg Crestor which had increased to 20 mg, he is still taking 20 mg.  I do not have recent labs but I have refilled his prescription.  Blood pressure is now well controlled on lisinopril HCT combination.  So his risk factors from medical standpoint are controlled however he needs lifestyle modification.  I discussed with him regarding obesity, African-American heritage, his age greater than 76, hypertension and hyperlipidemia along with diabetes would be high risk for cardiovascular events.  He appears to be motivated.  As his remained stable and remains asymptomatic, Except for chronic dyspnea on exertion which is related to obesity hypoventilation,  I'll see him back on a p.r.n. basis.  Adrian Prows, MD, [facility] 01/04/2019, 12:45 PM [facility] [place] Pager: [phone] Office: [phone] If no answer Cell [phone]

## 2019-01-04 ENCOUNTER — Ambulatory Visit (INDEPENDENT_AMBULATORY_CARE_PROVIDER_SITE_OTHER): Payer: Medicare Other | Admitting: Cardiology

## 2019-01-04 ENCOUNTER — Other Ambulatory Visit: Payer: Self-pay

## 2019-01-04 ENCOUNTER — Encounter: Payer: Self-pay | Admitting: Cardiology

## 2019-01-04 VITALS — BP 139/74 | HR 58 | Ht 66.0 in | Wt 242.8 lb

## 2019-01-04 DIAGNOSIS — I10 Essential (primary) hypertension: Secondary | ICD-10-CM | POA: Diagnosis not present

## 2019-01-04 DIAGNOSIS — E7800 Pure hypercholesterolemia, unspecified: Secondary | ICD-10-CM

## 2019-01-04 MED ORDER — LISINOPRIL-HYDROCHLOROTHIAZIDE 20-25 MG PO TABS: 1.0000 | ORAL_TABLET | Freq: Every day | ORAL | 3 refills | Status: DC

## 2019-01-04 MED ORDER — ROSUVASTATIN CALCIUM 20 MG PO TABS: 20.0000 mg | ORAL_TABLET | Freq: Every day | ORAL | 3 refills | Status: DC

## 2019-01-12 ENCOUNTER — Ambulatory Visit (INDEPENDENT_AMBULATORY_CARE_PROVIDER_SITE_OTHER): Payer: Medicare Other | Admitting: *Deleted

## 2019-01-12 DIAGNOSIS — J309 Allergic rhinitis, unspecified: Secondary | ICD-10-CM | POA: Diagnosis not present

## 2019-01-21 ENCOUNTER — Other Ambulatory Visit: Payer: Self-pay | Admitting: Cardiology

## 2019-01-27 ENCOUNTER — Ambulatory Visit (INDEPENDENT_AMBULATORY_CARE_PROVIDER_SITE_OTHER): Payer: Medicare Other | Admitting: *Deleted

## 2019-01-27 DIAGNOSIS — J309 Allergic rhinitis, unspecified: Secondary | ICD-10-CM | POA: Diagnosis not present

## 2019-02-09 ENCOUNTER — Ambulatory Visit (INDEPENDENT_AMBULATORY_CARE_PROVIDER_SITE_OTHER): Payer: Medicare Other | Admitting: *Deleted

## 2019-02-09 DIAGNOSIS — J309 Allergic rhinitis, unspecified: Secondary | ICD-10-CM | POA: Diagnosis not present

## 2019-02-16 ENCOUNTER — Ambulatory Visit (INDEPENDENT_AMBULATORY_CARE_PROVIDER_SITE_OTHER): Payer: Medicare Other | Admitting: *Deleted

## 2019-02-16 DIAGNOSIS — J309 Allergic rhinitis, unspecified: Secondary | ICD-10-CM

## 2019-02-23 ENCOUNTER — Ambulatory Visit (INDEPENDENT_AMBULATORY_CARE_PROVIDER_SITE_OTHER): Payer: Medicare Other | Admitting: Physician Assistant

## 2019-02-23 ENCOUNTER — Encounter (INDEPENDENT_AMBULATORY_CARE_PROVIDER_SITE_OTHER): Payer: Self-pay | Admitting: Physician Assistant

## 2019-02-23 ENCOUNTER — Other Ambulatory Visit: Payer: Self-pay

## 2019-02-23 DIAGNOSIS — R7303 Prediabetes: Secondary | ICD-10-CM

## 2019-02-23 DIAGNOSIS — Z6839 Body mass index (BMI) 39.0-39.9, adult: Secondary | ICD-10-CM | POA: Diagnosis not present

## 2019-02-23 DIAGNOSIS — E7849 Other hyperlipidemia: Secondary | ICD-10-CM | POA: Diagnosis not present

## 2019-02-23 MED ORDER — METFORMIN HCL 500 MG PO TABS: 500.0000 mg | ORAL_TABLET | Freq: Every morning | ORAL | 0 refills | Status: DC

## 2019-02-24 NOTE — Progress Notes (Signed)
Office: [phone]  /  Fax: [phone] TeleHealth Visit:  Tyler [name] has verbally consented to this TeleHealth visit today. The patient is located at home, the provider is located at the [facility] office. The participants in this visit include the listed provider, patient, and the patient's wife, Tyler [name]. The visit was conducted today via FaceTime.  HPI:   Chief Complaint: OBESITY Tyler [name] is here to discuss his progress with his obesity treatment plan. He is on the Category 2 plan and is following his eating plan approximately 90% of the time. He states he is exercising 0 minutes 0 times per week. Tyler [name] reports that his weight today at home was 244 lbs. He has not been fully on the plan for the last few months due to [place] mother who recently passed. He is ready to get back on track. We were unable to weigh the patient today for this TeleHealth visit. He states his weight today was 244 lbs. He has lost 0 lbs since starting treatment with us.  Pre-Diabetes Tyler [name] has a diagnosis of prediabetes based on his elevated Hgb A1c and was informed this puts him at greater risk of developing diabetes. He is not taking metformin currently; he discontinued it on his own. He continues to work on diet and exercise to decrease risk of diabetes. He denies nausea or hypoglycemia.  Hyperlipidemia Tyler [name] has hyperlipidemia and has been trying to improve his cholesterol levels with intensive lifestyle modification including a low saturated fat diet, exercise and weight loss. He is on Crestor and denies any chest pain.  ASSESSMENT AND PLAN:  Prediabetes - Plan: metFORMIN (GLUCOPHAGE) 500 MG tablet  Other hyperlipidemia  Class 2 severe obesity with serious comorbidity and body mass index (BMI) of 39.0 to 39.9 in adult, unspecified obesity type (HCC)  PLAN:  Pre-Diabetes Tyler [name] will continue to work on weight loss, exercise,  and decreasing simple carbohydrates in his diet to help decrease the risk of diabetes. We dicussed metformin including benefits and risks. He was informed that eating too many simple carbohydrates or too many calories at one sitting increases the likelihood of GI side effects. Tyler [name] will restart metformin #30 with 0 refills and agrees to follow-up with our clinic in 2 weeks.  Hyperlipidemia Tyler [name] was informed of the American Heart Association Guidelines emphasizing intensive lifestyle modifications as the first line treatment for hyperlipidemia. We discussed many lifestyle modifications today in depth, and Tyler [name] will continue to work on decreasing saturated fats such as fatty red meat, butter and many fried foods. He will continue Crestor, increase vegetables and lean protein in his diet, and continue to work on exercise and weight loss efforts.  Obesity Tyler [name] is currently in the action stage of change. As such, his goal is to continue with weight loss efforts. He has agreed to follow the Category 2 plan. Tyler [name] has been instructed to work up to a goal of 150 minutes of combined cardio and strengthening exercise per week for weight loss and overall health benefits. We discussed the following Behavioral Modification Strategies today: work on meal planning, easy cooking plans, and keeping healthy foods in the home.  Tyler [name] has agreed to follow-up with our clinic in 2 weeks. He was informed of the importance of frequent follow-up visits to maximize his success with intensive lifestyle modifications for his multiple health conditions.  ALLERGIES: No Known Allergies  MEDICATIONS: Current Outpatient Medications on File Prior to Visit  Medication Sig Dispense Refill  . acetaminophen (TYLENOL) 500 MG tablet Take 500 mg by mouth every 6 (six) hours as needed for mild pain.    Marland Kitchen amLODipine (NORVASC) 10 MG tablet TAKE 1 TABLET BY MOUTH EVERY DAY 90 tablet 1  . amLODipine (NORVASC) 5 MG tablet Take 5 mg by  mouth daily.    . Cholecalciferol (VITAMIN D3) 2000 UNITS TABS Take 2,000 Units by mouth daily.    . CHROMIUM PO Take 1 tablet by mouth every morning.    Marland Kitchen dextromethorphan-guaiFENesin (MUCINEX DM) 30-600 MG 12hr tablet Take 1 tablet by mouth 2 (two) times daily as needed for cough.    . docusate sodium (COLACE) 100 MG capsule Take 200 mg by mouth at bedtime as needed for mild constipation.    Marland Kitchen lisinopril-hydrochlorothiazide (PRINZIDE,ZESTORETIC) 20-25 MG tablet Take 1 tablet by mouth daily. 90 tablet 3  . Melatonin 10 MG CAPS Take 1 capsule as needed by mouth. (Patient not taking: Reported on 01/04/2019) 30 capsule 0  . multivitamin-iron-minerals-folic acid (CENTRUM) chewable tablet Chew 1 tablet by mouth daily.    . Potassium 99 MG TABS Take 99 mg by mouth daily.    . QNASL 80 MCG/ACT AERS ONE SPRAY EACH NOSTRIL TWICE DAILY FOR STUFFY NOSE OR DRAINAGE. 8.7 Inhaler 0  . rosuvastatin (CRESTOR) 20 MG tablet Take 1 tablet (20 mg total) by mouth daily. 90 tablet 3  . triamterene-hydrochlorothiazide (MAXZIDE-25) 37.5-25 MG per tablet Take 0.5 tablets by mouth daily.     . Vitamin D, Ergocalciferol, (DRISDOL) 50000 units CAPS capsule Take 1 capsule (50,000 Units total) by mouth every 7 (seven) days. 4 capsule 0   No current facility-administered medications on file prior to visit.     PAST MEDICAL HISTORY: Past Medical History:  Diagnosis Date  . Back pain   . Heartburn   . Hyperlipidemia   . Hypertension   . Insulin resistance   . Obesity     PAST SURGICAL HISTORY: Past Surgical History:  Procedure Laterality Date  . BACK SURGERY     03/14/2004  . EYE SURGERY     bil cataract  02/2013  . LUMBAR LAMINECTOMY/DECOMPRESSION MICRODISCECTOMY N/A 05/19/2015   Procedure: Lumbar four- five diskectomy;  Surgeon: Coletta Memos, MD;  Location: [facility];  Service: Neurosurgery;  Laterality: N/A;  L45 diskectomy    SOCIAL HISTORY: Social History   Tobacco Use  . Smoking status: Former Games developer   . Smokeless tobacco: Never Used  . Tobacco comment: quit smokling in '70s  Substance Use Topics  . Alcohol use: Yes    Alcohol/week: 4.0 standard drinks    Types: 4 Shots of liquor per week    Comment: social   . Drug use: No    FAMILY HISTORY: Family History  Problem Relation Age of Onset  . Hypertension Father   . Heart failure Mother   . Hypertension Sister   . Heart attack Brother    ROS: Review of Systems  Cardiovascular: Negative for chest pain.   PHYSICAL EXAM: Pt in no acute distress  RECENT LABS AND TESTS: BMET    Component Value Date/Time   NA 141 07/10/2017 0827   K 4.4 07/10/2017 0827   CL 107 (H) 07/10/2017 0827   CO2 21 07/10/2017 0827   GLUCOSE 113 (H) 07/10/2017 0827   GLUCOSE 112 (H) 08/25/2015 1342   BUN 20 07/10/2017 0827   CREATININE 1.03 07/10/2017 0827   CALCIUM 10.4 (H) 07/10/2017 0827   GFRNONAA 74 07/10/2017  0827   GFRAA 85 07/10/2017 0827   Lab Results  Component Value Date   HGBA1C 6.1 (H) 07/10/2017   HGBA1C 6.4 (H) 04/01/2017   Lab Results  Component Value Date   INSULIN 14.0 07/10/2017   INSULIN 14.6 04/01/2017   CBC    Component Value Date/Time   WBC 5.4 04/01/2017 1010   WBC 6.6 08/25/2015 1342   RBC 4.82 04/01/2017 1010   RBC 4.69 08/25/2015 1342   HGB 14.5 04/01/2017 1010   HCT 42.7 04/01/2017 1010   PLT 271 08/25/2015 1342   MCV 89 04/01/2017 1010   MCH 30.1 04/01/2017 1010   MCH 29.9 08/25/2015 1342   MCHC 34.0 04/01/2017 1010   MCHC 33.2 08/25/2015 1342   RDW 14.1 04/01/2017 1010   LYMPHSABS 2.4 04/01/2017 1010   EOSABS 0.1 04/01/2017 1010   BASOSABS 0.0 04/01/2017 1010   Iron/TIBC/Ferritin/ %Sat No results found for: IRON, TIBC, FERRITIN, IRONPCTSAT Lipid Panel     Component Value Date/Time   CHOL 183 07/10/2017 0827   TRIG 94 07/10/2017 0827   HDL 58 07/10/2017 0827   LDLCALC 106 (H) 07/10/2017 0827   Hepatic Function Panel     Component Value Date/Time   PROT 6.3 07/10/2017 0827   ALBUMIN  4.1 07/10/2017 0827   AST 16 07/10/2017 0827   ALT 16 07/10/2017 0827   ALKPHOS 57 07/10/2017 0827   BILITOT <0.2 07/10/2017 0827      Component Value Date/Time   TSH 1.350 04/01/2017 1010   Results for KALEEB, WEAVER (MRN [id number]) as of 02/24/2019 09:24  Ref. Range 07/10/2017 08:27  Vitamin D, 25-Hydroxy Latest Ref Range: 30.0 - 100.0 ng/mL 52.1    I, Marianna Payment, am acting as Energy manager for [employer], PA-C I, Alois Cliche, PA-C have reviewed above note and agree with its content

## 2019-02-25 NOTE — Addendum Note (Signed)
Addended by: Len Blalock on: 02/25/2019 03:12 PM   Modules accepted: Orders

## 2019-03-02 ENCOUNTER — Ambulatory Visit (INDEPENDENT_AMBULATORY_CARE_PROVIDER_SITE_OTHER): Payer: Medicare Other

## 2019-03-02 DIAGNOSIS — J309 Allergic rhinitis, unspecified: Secondary | ICD-10-CM

## 2019-03-05 LAB — LIPID PANEL WITH LDL/HDL RATIO
Cholesterol, Total: 201 mg/dL — ABNORMAL HIGH (ref 100–199)
HDL: 48 mg/dL (ref >39)
LDL Calculated: 130 mg/dL — ABNORMAL HIGH (ref 0–99)
LDl/HDL Ratio: 2.7 ratio (ref 0.0–3.6)
Triglycerides: 113 mg/dL (ref 0–149)
VLDL Cholesterol Cal: 23 mg/dL (ref 5–40)

## 2019-03-05 LAB — COMPREHENSIVE METABOLIC PANEL WITH GFR
ALT: 22 IU/L (ref 0–44)
AST: 14 IU/L (ref 0–40)
Albumin/Globulin Ratio: 1.8 (ref 1.2–2.2)
Albumin: 4.4 g/dL (ref 3.8–4.8)
Alkaline Phosphatase: 68 IU/L (ref 39–117)
BUN/Creatinine Ratio: 24 (ref 10–24)
BUN: 24 mg/dL (ref 8–27)
Bilirubin Total: <0.2 mg/dL (ref 0.0–1.2)
CO2: 20 mmol/L (ref 20–29)
Calcium: 10 mg/dL (ref 8.6–10.2)
Chloride: 103 mmol/L (ref 96–106)
Creatinine, Ser: 0.99 mg/dL (ref 0.76–1.27)
GFR calc Af Amer: 89 mL/min/1.73 (ref >59)
GFR calc non Af Amer: 77 mL/min/1.73 (ref >59)
Globulin, Total: 2.5 g/dL (ref 1.5–4.5)
Glucose: 109 mg/dL — ABNORMAL HIGH (ref 65–99)
Potassium: 4 mmol/L (ref 3.5–5.2)
Sodium: 138 mmol/L (ref 134–144)
Total Protein: 6.9 g/dL (ref 6.0–8.5)

## 2019-03-05 LAB — HEMOGLOBIN A1C
Est. average glucose Bld gHb Est-mCnc: 140 mg/dL
Hgb A1c MFr Bld: 6.5 % — ABNORMAL HIGH (ref 4.8–5.6)

## 2019-03-05 LAB — INSULIN, RANDOM: INSULIN: 27.8 u[IU]/mL — ABNORMAL HIGH (ref 2.6–24.9)

## 2019-03-05 LAB — VITAMIN D 25 HYDROXY (VIT D DEFICIENCY, FRACTURES): Vit D, 25-Hydroxy: 35.1 ng/mL (ref 30.0–100.0)

## 2019-03-10 ENCOUNTER — Ambulatory Visit (INDEPENDENT_AMBULATORY_CARE_PROVIDER_SITE_OTHER): Payer: Medicare Other | Admitting: *Deleted

## 2019-03-10 ENCOUNTER — Other Ambulatory Visit: Payer: Self-pay

## 2019-03-10 ENCOUNTER — Ambulatory Visit (INDEPENDENT_AMBULATORY_CARE_PROVIDER_SITE_OTHER): Payer: Medicare Other | Admitting: Physician Assistant

## 2019-03-10 ENCOUNTER — Encounter (INDEPENDENT_AMBULATORY_CARE_PROVIDER_SITE_OTHER): Payer: Self-pay | Admitting: Physician Assistant

## 2019-03-10 DIAGNOSIS — E559 Vitamin D deficiency, unspecified: Secondary | ICD-10-CM | POA: Diagnosis not present

## 2019-03-10 DIAGNOSIS — R7303 Prediabetes: Secondary | ICD-10-CM

## 2019-03-10 DIAGNOSIS — E7800 Pure hypercholesterolemia, unspecified: Secondary | ICD-10-CM | POA: Diagnosis not present

## 2019-03-10 DIAGNOSIS — Z6839 Body mass index (BMI) 39.0-39.9, adult: Secondary | ICD-10-CM

## 2019-03-10 DIAGNOSIS — J309 Allergic rhinitis, unspecified: Secondary | ICD-10-CM | POA: Diagnosis not present

## 2019-03-10 MED ORDER — VITAMIN D (ERGOCALCIFEROL) 1.25 MG (50000 UNIT) PO CAPS: 50000.0000 [IU] | ORAL_CAPSULE | ORAL | 0 refills | Status: DC

## 2019-03-10 MED ORDER — ROSUVASTATIN CALCIUM 40 MG PO TABS: 20.0000 mg | ORAL_TABLET | Freq: Every day | ORAL | 0 refills | Status: AC

## 2019-03-10 MED ORDER — METFORMIN HCL 500 MG PO TABS: 500.0000 mg | ORAL_TABLET | Freq: Two times a day (BID) | ORAL | 0 refills | Status: DC

## 2019-03-11 NOTE — Progress Notes (Signed)
Office: [phone]  /  Fax: [phone] TeleHealth Visit:  Tyler [name] has verbally consented to this TeleHealth visit today. The patient is located at home, the provider is located at the [facility] office. The participants in this visit include the listed provider, patient, and the patient's wife, [name]. The visit was conducted today via FaceTime.  HPI:   Chief Complaint: OBESITY Tyler [name] is here to discuss his progress with his obesity treatment plan. He is on the Category 2 plan and is following his eating plan approximately 90% of the time. He states he is doing yard work 1.5 hours 3 times per week. Anir reports that he was doing really well with the plan for the first week and dropped 3 lbs. He did some extra snacking the last week and gained it back.  We were unable to weigh the patient today for this TeleHealth visit. He feels as if he has maintained his weight (weight 244 lbs today) since his last visit. He has lost 0 lbs since starting treatment with [place].  Vitamin D deficiency Tyler [name] has a diagnosis of Vitamin D deficiency. He is currently taking prescription Vit D and denies nausea, vomiting or muscle weakness.  Diabetes II Tyler [name] has a new diagnosis of diabetes type II with an A1c of 6.5. Tyler [name] does not report checking his blood sugars. He has been working on intensive lifestyle modifications including diet, exercise, and weight loss to help control his blood glucose levels. No nausea, vomiting, or diarrhea. He does report some polyphagia.  Hyperlipidemia Tyler [name] has hyperlipidemia and has been trying to improve his cholesterol levels with intensive lifestyle modification including a low saturated fat diet, exercise and weight loss. He is on Crestor and denies any chest pain.  ASSESSMENT AND PLAN:  Vitamin D deficiency - Plan: Vitamin D, Ergocalciferol, (DRISDOL) 1.25 MG (50000 UT) CAPS capsule  Prediabetes - Plan: metFORMIN (GLUCOPHAGE) 500 MG  tablet  Hypercholesteremia - Plan: rosuvastatin (CRESTOR) 40 MG tablet  Class 2 severe obesity with serious comorbidity and body mass index (BMI) of 39.0 to 39.9 in adult, unspecified obesity type (HCC)  PLAN:  Vitamin D Deficiency Tobi was informed that low Vitamin D levels contributes to fatigue and are associated with obesity, breast, and colon cancer. He agrees to continue to take prescription Vit D @ 50,000 IU every week #4 with 0 refills and will follow-up for routine testing of Vitamin D, at least 2-3 times per year. He was informed of the risk of over-replacement of Vitamin D and agrees to not increase his dose unless he discusses this with [place] first. Tyler [name] agrees to follow-up with our clinic in 2 weeks.  Diabetes II Tyler [name] has been given extensive diabetes education by myself today including ideal fasting and post-prandial blood glucose readings, individual ideal HgA1c goals  and hypoglycemia prevention. We discussed the importance of good blood sugar control to decrease the likelihood of diabetic complications such as nephropathy, neuropathy, limb loss, blindness, coronary artery disease, and death. We discussed the importance of intensive lifestyle modification including diet, exercise and weight loss as the first line treatment for diabetes. Maclane will increase his metformin dose to 500 mg 1 PO BID #60 with 0 refills and agrees to follow-up with our clinic in 2 weeks.  Hyperlipidemia Tyler [name] was informed of the American Heart Association Guidelines emphasizing intensive lifestyle modifications as the first line treatment for hyperlipidemia. We discussed many lifestyle modifications today in depth, and Camila will continue to work on decreasing saturated  fats such as fatty red meat, butter and many fried foods. Tyler [name] will increase his Crestor dose to 40 mg 1 PO QHS #30 with 0 refills and agrees to follow-up with our clinic in 2 weeks. He will also increase vegetables and lean protein in his  diet and continue to work on exercise and weight loss efforts.  Obesity Tyler [name] is currently in the action stage of change. As such, his goal is to continue with weight loss efforts. He has agreed to follow the Category 2 plan. Tyler [name] has been instructed to work up to a goal of 150 minutes of combined cardio and strengthening exercise per week for weight loss and overall health benefits. We discussed the following Behavioral Modification Strategies today: decreasing simple carbohydrates, work on meal planning, easy cooking plans, and better snacking choices.  Tyler [name] has agreed to follow-up with our clinic in 2 weeks. He was informed of the importance of frequent follow-up visits to maximize his success with intensive lifestyle modifications for his multiple health conditions.  ALLERGIES: No Known Allergies  MEDICATIONS: Current Outpatient Medications on File Prior to Visit  Medication Sig Dispense Refill   acetaminophen (TYLENOL) 500 MG tablet Take 500 mg by mouth every 6 (six) hours as needed for mild pain.     amLODipine (NORVASC) 10 MG tablet TAKE 1 TABLET BY MOUTH EVERY DAY 90 tablet 1   amLODipine (NORVASC) 5 MG tablet Take 5 mg by mouth daily.     Cholecalciferol (VITAMIN D3) 2000 UNITS TABS Take 2,000 Units by mouth daily.     CHROMIUM PO Take 1 tablet by mouth every morning.     dextromethorphan-guaiFENesin (MUCINEX DM) 30-600 MG 12hr tablet Take 1 tablet by mouth 2 (two) times daily as needed for cough.     docusate sodium (COLACE) 100 MG capsule Take 200 mg by mouth at bedtime as needed for mild constipation.     lisinopril-hydrochlorothiazide (PRINZIDE,ZESTORETIC) 20-25 MG tablet Take 1 tablet by mouth daily. 90 tablet 3   Melatonin 10 MG CAPS Take 1 capsule as needed by mouth. (Patient not taking: Reported on 01/04/2019) 30 capsule 0   multivitamin-iron-minerals-folic acid (CENTRUM) chewable tablet Chew 1 tablet by mouth daily.     Potassium 99 MG TABS Take 99 mg by mouth  daily.     QNASL 80 MCG/ACT AERS ONE SPRAY EACH NOSTRIL TWICE DAILY FOR STUFFY NOSE OR DRAINAGE. 8.7 Inhaler 0   triamterene-hydrochlorothiazide (MAXZIDE-25) 37.5-25 MG per tablet Take 0.5 tablets by mouth daily.      No current facility-administered medications on file prior to visit.     PAST MEDICAL HISTORY: Past Medical History:  Diagnosis Date   Back pain    Heartburn    Hyperlipidemia    Hypertension    Insulin resistance    Obesity     PAST SURGICAL HISTORY: Past Surgical History:  Procedure Laterality Date   BACK SURGERY     03/14/2004   EYE SURGERY     bil cataract  02/2013   LUMBAR LAMINECTOMY/DECOMPRESSION MICRODISCECTOMY N/A 05/19/2015   Procedure: Lumbar four- five diskectomy;  Surgeon: Coletta MemosKyle Cabbell, MD;  Location: [facility];  Service: Neurosurgery;  Laterality: N/A;  L45 diskectomy    SOCIAL HISTORY: Social History   Tobacco Use   Smoking status: Former Smoker   Smokeless tobacco: Never Used   Tobacco comment: quit smokling in '70s  Substance Use Topics   Alcohol use: Yes    Alcohol/week: 4.0 standard drinks    Types: 4 Shots  of liquor per week    Comment: social    Drug use: No    FAMILY HISTORY: Family History  Problem Relation Age of Onset   Hypertension Father    Heart failure Mother    Hypertension Sister    Heart attack Brother    ROS: Review of Systems  Cardiovascular: Negative for chest pain.  Gastrointestinal: Negative for diarrhea, nausea and vomiting.  Musculoskeletal:       Negative for muscle weakness.  Endo/Heme/Allergies:       Positive for some polyphagia.   PHYSICAL EXAM: Pt in no acute distress  RECENT LABS AND TESTS: BMET    Component Value Date/Time   NA 138 03/04/2019 0959   K 4.0 03/04/2019 0959   CL 103 03/04/2019 0959   CO2 20 03/04/2019 0959   GLUCOSE 109 (H) 03/04/2019 0959   GLUCOSE 112 (H) 08/25/2015 1342   BUN 24 03/04/2019 0959   CREATININE 0.99 03/04/2019 0959   CALCIUM 10.0  03/04/2019 0959   GFRNONAA 77 03/04/2019 0959   GFRAA 89 03/04/2019 0959   Lab Results  Component Value Date   HGBA1C 6.5 (H) 03/04/2019   HGBA1C 6.1 (H) 07/10/2017   HGBA1C 6.4 (H) 04/01/2017   Lab Results  Component Value Date   INSULIN 27.8 (H) 03/04/2019   INSULIN 14.0 07/10/2017   INSULIN 14.6 04/01/2017   CBC    Component Value Date/Time   WBC 5.4 04/01/2017 1010   WBC 6.6 08/25/2015 1342   RBC 4.82 04/01/2017 1010   RBC 4.69 08/25/2015 1342   HGB 14.5 04/01/2017 1010   HCT 42.7 04/01/2017 1010   PLT 271 08/25/2015 1342   MCV 89 04/01/2017 1010   MCH 30.1 04/01/2017 1010   MCH 29.9 08/25/2015 1342   MCHC 34.0 04/01/2017 1010   MCHC 33.2 08/25/2015 1342   RDW 14.1 04/01/2017 1010   LYMPHSABS 2.4 04/01/2017 1010   EOSABS 0.1 04/01/2017 1010   BASOSABS 0.0 04/01/2017 1010   Iron/TIBC/Ferritin/ %Sat No results found for: IRON, TIBC, FERRITIN, IRONPCTSAT Lipid Panel     Component Value Date/Time   CHOL 201 (H) 03/04/2019 0959   TRIG 113 03/04/2019 0959   HDL 48 03/04/2019 0959   LDLCALC 130 (H) 03/04/2019 0959   Hepatic Function Panel     Component Value Date/Time   PROT 6.9 03/04/2019 0959   ALBUMIN 4.4 03/04/2019 0959   AST 14 03/04/2019 0959   ALT 22 03/04/2019 0959   ALKPHOS 68 03/04/2019 0959   BILITOT <0.2 03/04/2019 0959      Component Value Date/Time   TSH 1.350 04/01/2017 1010   Results for JOSHWA, HEMRIC (MRN [id number]) as of 03/11/2019 08:46  Ref. Range 03/04/2019 09:59  Vitamin D, 25-Hydroxy Latest Ref Range: 30.0 - 100.0 ng/mL 35.1    I, Marianna Payment, am acting as Energy manager for [employer], PA-C I, Alois Cliche, PA-C have reviewed above note and agree with its content

## 2019-03-19 ENCOUNTER — Ambulatory Visit (INDEPENDENT_AMBULATORY_CARE_PROVIDER_SITE_OTHER): Payer: Medicare Other | Admitting: *Deleted

## 2019-03-19 DIAGNOSIS — J309 Allergic rhinitis, unspecified: Secondary | ICD-10-CM | POA: Diagnosis not present

## 2019-03-23 ENCOUNTER — Other Ambulatory Visit (INDEPENDENT_AMBULATORY_CARE_PROVIDER_SITE_OTHER): Payer: Self-pay | Admitting: Physician Assistant

## 2019-03-23 DIAGNOSIS — R7303 Prediabetes: Secondary | ICD-10-CM

## 2019-03-24 ENCOUNTER — Encounter (INDEPENDENT_AMBULATORY_CARE_PROVIDER_SITE_OTHER): Payer: Self-pay | Admitting: Physician Assistant

## 2019-03-24 ENCOUNTER — Ambulatory Visit (INDEPENDENT_AMBULATORY_CARE_PROVIDER_SITE_OTHER): Payer: Medicare Other | Admitting: Physician Assistant

## 2019-03-24 ENCOUNTER — Other Ambulatory Visit: Payer: Self-pay

## 2019-03-24 DIAGNOSIS — E119 Type 2 diabetes mellitus without complications: Secondary | ICD-10-CM

## 2019-03-24 DIAGNOSIS — Z6839 Body mass index (BMI) 39.0-39.9, adult: Secondary | ICD-10-CM

## 2019-03-24 DIAGNOSIS — E559 Vitamin D deficiency, unspecified: Secondary | ICD-10-CM

## 2019-03-24 MED ORDER — VITAMIN D (ERGOCALCIFEROL) 1.25 MG (50000 UNIT) PO CAPS: 50000.0000 [IU] | ORAL_CAPSULE | ORAL | 0 refills | Status: DC

## 2019-03-24 NOTE — Progress Notes (Signed)
Office: [phone]  /  Fax: [phone] TeleHealth Visit:  Tyler [name] has verbally consented to this TeleHealth visit today. The patient is located at home, the provider is located at the [facility] office. The participants in this visit include the listed provider and patient (patient's wife, Tyler [name], may walk by). The visit was conducted today via FaceTime.  HPI:   Chief Complaint: OBESITY Tyler [name] is here to discuss his progress with his obesity treatment plan. He is on the Category 2 plan and is following his eating plan approximately 95% of the time. He states he is exercising on treadmill, elliptical, and lifting weights 30 minutes 2 times per week. Tyler [name] reports he is doing a better job following the plan. He is overeating his snack calories currently. He wants to switch his dinner and lunch meals. We were unable to weigh the patient today for this TeleHealth visit. He feels as if he has lost 1 lb since his last visit. He has lost 0 lbs since starting treatment with Tyler [name].  Vitamin D deficiency Tyler [name] has a diagnosis of Vitamin D deficiency. He is currently taking prescription Vit D and denies nausea, vomiting or muscle weakness.  Diabetes II Tyler [name] has a diagnosis of diabetes type II and is on metformin. Tyler [name] does not report checking his blood sugars. Last A1c was 6.5 on 03/04/2019. He has been working on intensive lifestyle modifications including diet, exercise, and weight loss to help control his blood glucose levels. No nausea, vomiting, or diarrhea. No polyphagia.  ASSESSMENT AND PLAN:  Vitamin D deficiency - Plan: Vitamin D, Ergocalciferol, (DRISDOL) 1.25 MG (50000 UT) CAPS capsule  Type 2 diabetes mellitus without complication, without long-term current use of insulin (HCC)  Class 2 severe obesity with serious comorbidity and body mass index (BMI) of 39.0 to 39.9 in adult, unspecified obesity type (HCC)  PLAN:  Vitamin D Deficiency Tyler [name] was  informed that low Vitamin D levels contributes to fatigue and are associated with obesity, breast, and colon cancer. He agrees to continue to take prescription Vit D @ 50,000 IU every week #4 with 0 refills and will follow-up for routine testing of Vitamin D, at least 2-3 times per year. He was informed of the risk of over-replacement of Vitamin D and agrees to not increase his dose unless he discusses this with Tyler [name] first. Tyler [name] agrees to follow-up with our clinic in 2 weeks.  Diabetes II Tyler [name] has been given extensive diabetes education by myself today including ideal fasting and post-prandial blood glucose readings, individual ideal HgA1c goals  and hypoglycemia prevention. We discussed the importance of good blood sugar control to decrease the likelihood of diabetic complications such as nephropathy, neuropathy, limb loss, blindness, coronary artery disease, and death. We discussed the importance of intensive lifestyle modification including diet, exercise and weight loss as the first line treatment for diabetes. Tyler [name] agrees to continue metformin and will follow-up at the agreed upon time.  Obesity Tyler [name] is currently in the action stage of change. As such, his goal is to continue with weight loss efforts. He has agreed to follow the Category 2 plan or journal 1200 calories + 90 grams of protein daily. Tyler [name] has been instructed to work up to a goal of 150 minutes of combined cardio and strengthening exercise per week for weight loss and overall health benefits. We discussed the following Behavioral Modification Strategies today: work on meal planning, easy cooking plans, and keeping healthy foods in the home.  Tyler [name] has agreed  to follow-up with our clinic in 2 weeks. He was informed of the importance of frequent follow-up visits to maximize his success with intensive lifestyle modifications for his multiple health conditions.  ALLERGIES: No Known Allergies  MEDICATIONS: Current Outpatient  Medications on File Prior to Visit  Medication Sig Dispense Refill  . acetaminophen (TYLENOL) 500 MG tablet Take 500 mg by mouth every 6 (six) hours as needed for mild pain.    Marland Kitchen amLODipine (NORVASC) 10 MG tablet TAKE 1 TABLET BY MOUTH EVERY DAY 90 tablet 1  . CHROMIUM PO Take 1 tablet by mouth every morning.    Marland Kitchen dextromethorphan-guaiFENesin (MUCINEX DM) 30-600 MG 12hr tablet Take 1 tablet by mouth 2 (two) times daily as needed for cough.    . docusate sodium (COLACE) 100 MG capsule Take 200 mg by mouth at bedtime as needed for mild constipation.    Marland Kitchen lisinopril-hydrochlorothiazide (PRINZIDE,ZESTORETIC) 20-25 MG tablet Take 1 tablet by mouth daily. 90 tablet 3  . Melatonin 10 MG CAPS Take 1 capsule as needed by mouth. 30 capsule 0  . metFORMIN (GLUCOPHAGE) 500 MG tablet Take 1 tablet (500 mg total) by mouth 2 (two) times daily with a meal. 60 tablet 0  . multivitamin-iron-minerals-folic acid (CENTRUM) chewable tablet Chew 1 tablet by mouth daily.    . Potassium 99 MG TABS Take 99 mg by mouth daily.    . QNASL 80 MCG/ACT AERS ONE SPRAY EACH NOSTRIL TWICE DAILY FOR STUFFY NOSE OR DRAINAGE. 8.7 Inhaler 0  . rosuvastatin (CRESTOR) 40 MG tablet Take 0.5 tablets (20 mg total) by mouth daily. 30 tablet 0  . triamterene-hydrochlorothiazide (MAXZIDE-25) 37.5-25 MG per tablet Take 0.5 tablets by mouth daily.     Marland Kitchen amLODipine (NORVASC) 5 MG tablet Take 5 mg by mouth daily.    . Cholecalciferol (VITAMIN D3) 2000 UNITS TABS Take 2,000 Units by mouth daily.     No current facility-administered medications on file prior to visit.     PAST MEDICAL HISTORY: Past Medical History:  Diagnosis Date  . Back pain   . Heartburn   . Hyperlipidemia   . Hypertension   . Insulin resistance   . Obesity     PAST SURGICAL HISTORY: Past Surgical History:  Procedure Laterality Date  . BACK SURGERY     03/14/2004  . EYE SURGERY     bil cataract  02/2013  . LUMBAR LAMINECTOMY/DECOMPRESSION MICRODISCECTOMY N/A  05/19/2015   Procedure: Lumbar four- five diskectomy;  Surgeon: Coletta Memos, MD;  Location: [facility];  Service: Neurosurgery;  Laterality: N/A;  L45 diskectomy    SOCIAL HISTORY: Social History   Tobacco Use  . Smoking status: Former Games developer  . Smokeless tobacco: Never Used  . Tobacco comment: quit smokling in '70s  Substance Use Topics  . Alcohol use: Yes    Alcohol/week: 4.0 standard drinks    Types: 4 Shots of liquor per week    Comment: social   . Drug use: No    FAMILY HISTORY: Family History  Problem Relation Age of Onset  . Hypertension Father   . Heart failure Mother   . Hypertension Sister   . Heart attack Brother    ROS: Review of Systems  Gastrointestinal: Negative for diarrhea, nausea and vomiting.  Musculoskeletal:       Negative for muscle weakness.  Endo/Heme/Allergies:       Negative for polyphagia.   PHYSICAL EXAM: Pt in no acute distress  RECENT LABS AND TESTS: BMET  Component Value Date/Time   NA 138 03/04/2019 0959   K 4.0 03/04/2019 0959   CL 103 03/04/2019 0959   CO2 20 03/04/2019 0959   GLUCOSE 109 (H) 03/04/2019 0959   GLUCOSE 112 (H) 08/25/2015 1342   BUN 24 03/04/2019 0959   CREATININE 0.99 03/04/2019 0959   CALCIUM 10.0 03/04/2019 0959   GFRNONAA 77 03/04/2019 0959   GFRAA 89 03/04/2019 0959   Lab Results  Component Value Date   HGBA1C 6.5 (H) 03/04/2019   HGBA1C 6.1 (H) 07/10/2017   HGBA1C 6.4 (H) 04/01/2017   Lab Results  Component Value Date   INSULIN 27.8 (H) 03/04/2019   INSULIN 14.0 07/10/2017   INSULIN 14.6 04/01/2017   CBC    Component Value Date/Time   WBC 5.4 04/01/2017 1010   WBC 6.6 08/25/2015 1342   RBC 4.82 04/01/2017 1010   RBC 4.69 08/25/2015 1342   HGB 14.5 04/01/2017 1010   HCT 42.7 04/01/2017 1010   PLT 271 08/25/2015 1342   MCV 89 04/01/2017 1010   MCH 30.1 04/01/2017 1010   MCH 29.9 08/25/2015 1342   MCHC 34.0 04/01/2017 1010   MCHC 33.2 08/25/2015 1342   RDW 14.1 04/01/2017 1010    LYMPHSABS 2.4 04/01/2017 1010   EOSABS 0.1 04/01/2017 1010   BASOSABS 0.0 04/01/2017 1010   Iron/TIBC/Ferritin/ %Sat No results found for: IRON, TIBC, FERRITIN, IRONPCTSAT Lipid Panel     Component Value Date/Time   CHOL 201 (H) 03/04/2019 0959   TRIG 113 03/04/2019 0959   HDL 48 03/04/2019 0959   LDLCALC 130 (H) 03/04/2019 0959   Hepatic Function Panel     Component Value Date/Time   PROT 6.9 03/04/2019 0959   ALBUMIN 4.4 03/04/2019 0959   AST 14 03/04/2019 0959   ALT 22 03/04/2019 0959   ALKPHOS 68 03/04/2019 0959   BILITOT <0.2 03/04/2019 0959      Component Value Date/Time   TSH 1.350 04/01/2017 1010   Results for ZEDEKIAH, HINDERMAN (MRN [id number]) as of 03/24/2019 15:56  Ref. Range 03/04/2019 09:59  Vitamin D, 25-Hydroxy Latest Ref Range: 30.0 - 100.0 ng/mL 35.1    I, Marianna Payment, am acting as Energy manager for [employer], PA-C

## 2019-03-30 ENCOUNTER — Other Ambulatory Visit (INDEPENDENT_AMBULATORY_CARE_PROVIDER_SITE_OTHER): Payer: Self-pay | Admitting: Physician Assistant

## 2019-03-30 DIAGNOSIS — E559 Vitamin D deficiency, unspecified: Secondary | ICD-10-CM

## 2019-04-01 ENCOUNTER — Ambulatory Visit (INDEPENDENT_AMBULATORY_CARE_PROVIDER_SITE_OTHER): Payer: Medicare Other | Admitting: *Deleted

## 2019-04-01 ENCOUNTER — Other Ambulatory Visit (INDEPENDENT_AMBULATORY_CARE_PROVIDER_SITE_OTHER): Payer: Self-pay | Admitting: Physician Assistant

## 2019-04-01 DIAGNOSIS — J309 Allergic rhinitis, unspecified: Secondary | ICD-10-CM | POA: Diagnosis not present

## 2019-04-01 DIAGNOSIS — R7303 Prediabetes: Secondary | ICD-10-CM

## 2019-04-08 ENCOUNTER — Encounter (INDEPENDENT_AMBULATORY_CARE_PROVIDER_SITE_OTHER): Payer: Self-pay | Admitting: Bariatrics

## 2019-04-08 ENCOUNTER — Ambulatory Visit (INDEPENDENT_AMBULATORY_CARE_PROVIDER_SITE_OTHER): Payer: Medicare Other | Admitting: Bariatrics

## 2019-04-08 ENCOUNTER — Other Ambulatory Visit: Payer: Self-pay

## 2019-04-08 DIAGNOSIS — E119 Type 2 diabetes mellitus without complications: Secondary | ICD-10-CM | POA: Diagnosis not present

## 2019-04-08 DIAGNOSIS — Z6839 Body mass index (BMI) 39.0-39.9, adult: Secondary | ICD-10-CM | POA: Diagnosis not present

## 2019-04-08 DIAGNOSIS — I10 Essential (primary) hypertension: Secondary | ICD-10-CM

## 2019-04-09 ENCOUNTER — Other Ambulatory Visit (INDEPENDENT_AMBULATORY_CARE_PROVIDER_SITE_OTHER): Payer: Self-pay | Admitting: Physician Assistant

## 2019-04-09 DIAGNOSIS — E7800 Pure hypercholesterolemia, unspecified: Secondary | ICD-10-CM

## 2019-04-12 NOTE — Progress Notes (Signed)
Office: [phone]  /  Fax: [phone] TeleHealth Visit:  Tyler [name] has verbally consented to this TeleHealth visit today. The patient is located at home, the provider is located at the [facility] office. The participants in this visit include the listed provider and patient and any and all parties involved. The visit was conducted today via FaceTime.  HPI:   Chief Complaint: OBESITY Tyler [name] is here to discuss his progress with his obesity treatment plan. He is on the Category 2 plan and is following his eating plan approximately 100 % of the time. He states he is exercising with the Elliptical, walking on the treadmill and biking 20 to 25 minutes 3 times per week. Abdiel normally sees Abby Potash, PA-C. He states that he has lost 4 pounds and is doing well. We were unable to weigh the patient today for this TeleHealth visit. He feels as if he has lost weight since his last visit. He has gained 3 lbs since starting treatment with [place].  Diabetes II (well controlled) Kree has a diagnosis of diabetes type II. Miqueas is taking metformin without any side effects. He denies any hypoglycemic episodes. Last A1c was at 6.5 and last insulin level was at 27.8 He has been working on intensive lifestyle modifications including diet, exercise, and weight loss to help control his blood glucose levels.  Hypertension Tyler [name] is a 73 y.o. male with hypertension. He takes Norvasc, Zestoretic and Maxzide. Tyler [name] denies chest pain or shortness of breath on exertion. He is working weight loss to help control his blood pressure with the goal of decreasing his risk of heart attack and stroke. Jamess blood pressure is reasonably well controlled at 148/72.  ASSESSMENT AND PLAN:  Type 2 diabetes mellitus without complication, without long-term current use of insulin (HCC)  Essential hypertension  Class 2 severe obesity with serious comorbidity and body mass  index (BMI) of 39.0 to 39.9 in adult, unspecified obesity type ([place])  PLAN:  Diabetes II (well controlled) Ori has been given extensive diabetes education by myself today including ideal fasting and post-prandial blood glucose readings, individual ideal Hgb A1c goals and hypoglycemia prevention. We discussed the importance of good blood sugar control to decrease the likelihood of diabetic complications such as nephropathy, neuropathy, limb loss, blindness, coronary artery disease, and death. We discussed the importance of intensive lifestyle modification including diet, exercise and weight loss as the first line treatment for diabetes. Rishon agrees to continue his diabetes medications and will follow up at the agreed upon time.  Hypertension We discussed sodium restriction, working on healthy weight loss, and a regular exercise program as the means to achieve improved blood pressure control. Tyler [name] agreed with this plan and agreed to follow up as directed. We will continue to monitor his blood pressure as well as his progress with the above lifestyle modifications. He will continue his medications as prescribed and will watch for signs of hypotension as he continues his lifestyle modifications.  Obesity Tyler [name] is currently in the action stage of change. As such, his goal is to continue with weight loss efforts He has agreed to follow the Category 2 plan Tyler [name] will continue his exercise regimen for weight loss and overall health benefits. We discussed the following Behavioral Modification Strategies today: increase H2O intake, no skipping meals, keeping healthy foods in the home, increasing lean protein intake, decreasing simple carbohydrates, increasing vegetables, decrease eating out and work on meal planning and intentional eating  Tyler [name]  has agreed to follow up with our clinic in 2 weeks. He was informed of the importance of frequent follow up visits to maximize his success with intensive  lifestyle modifications for his multiple health conditions.  ALLERGIES: No Known Allergies  MEDICATIONS: Current Outpatient Medications on File Prior to Visit  Medication Sig Dispense Refill   acetaminophen (TYLENOL) 500 MG tablet Take 500 mg by mouth every 6 (six) hours as needed for mild pain.     amLODipine (NORVASC) 10 MG tablet TAKE 1 TABLET BY MOUTH EVERY DAY 90 tablet 1   amLODipine (NORVASC) 5 MG tablet Take 5 mg by mouth daily.     Cholecalciferol (VITAMIN D3) 2000 UNITS TABS Take 2,000 Units by mouth daily.     CHROMIUM PO Take 1 tablet by mouth every morning.     dextromethorphan-guaiFENesin (MUCINEX DM) 30-600 MG 12hr tablet Take 1 tablet by mouth 2 (two) times daily as needed for cough.     docusate sodium (COLACE) 100 MG capsule Take 200 mg by mouth at bedtime as needed for mild constipation.     lisinopril-hydrochlorothiazide (PRINZIDE,ZESTORETIC) 20-25 MG tablet Take 1 tablet by mouth daily. 90 tablet 3   Melatonin 10 MG CAPS Take 1 capsule as needed by mouth. 30 capsule 0   metFORMIN (GLUCOPHAGE) 500 MG tablet Take 1 tablet (500 mg total) by mouth 2 (two) times daily with a meal. 60 tablet 0   multivitamin-iron-minerals-folic acid (CENTRUM) chewable tablet Chew 1 tablet by mouth daily.     Potassium 99 MG TABS Take 99 mg by mouth daily.     QNASL 80 MCG/ACT AERS ONE SPRAY EACH NOSTRIL TWICE DAILY FOR STUFFY NOSE OR DRAINAGE. 8.7 Inhaler 0   rosuvastatin (CRESTOR) 40 MG tablet Take 0.5 tablets (20 mg total) by mouth daily. 30 tablet 0   triamterene-hydrochlorothiazide (MAXZIDE-25) 37.5-25 MG per tablet Take 0.5 tablets by mouth daily.      Vitamin D, Ergocalciferol, (DRISDOL) 1.25 MG (50000 UT) CAPS capsule Take 1 capsule (50,000 Units total) by mouth every 7 (seven) days. 4 capsule 0   No current facility-administered medications on file prior to visit.     PAST MEDICAL HISTORY: Past Medical History:  Diagnosis Date   Back pain    Heartburn     Hyperlipidemia    Hypertension    Insulin resistance    Obesity     PAST SURGICAL HISTORY: Past Surgical History:  Procedure Laterality Date   BACK SURGERY     03/14/2004   EYE SURGERY     bil cataract  02/2013   LUMBAR LAMINECTOMY/DECOMPRESSION MICRODISCECTOMY N/A 05/19/2015   Procedure: Lumbar four- five diskectomy;  Surgeon: Coletta MemosKyle Cabbell, MD;  Location: [facility];  Service: Neurosurgery;  Laterality: N/A;  L45 diskectomy    SOCIAL HISTORY: Social History   Tobacco Use   Smoking status: Former Smoker   Smokeless tobacco: Never Used   Tobacco comment: quit smokling in '70s  Substance Use Topics   Alcohol use: Yes    Alcohol/week: 4.0 standard drinks    Types: 4 Shots of liquor per week    Comment: social    Drug use: No    FAMILY HISTORY: Family History  Problem Relation Age of Onset   Hypertension Father    Heart failure Mother    Hypertension Sister    Heart attack Brother     ROS: Review of Systems  Constitutional: Positive for weight loss.  Respiratory: Negative for shortness of breath (on exertion).  Cardiovascular: Negative for chest pain.  Gastrointestinal: Negative for diarrhea, nausea and vomiting.  Endo/Heme/Allergies:       Negative for hypoglycemia    PHYSICAL EXAM: Pt in no acute distress  RECENT LABS AND TESTS: BMET    Component Value Date/Time   NA 138 03/04/2019 0959   K 4.0 03/04/2019 0959   CL 103 03/04/2019 0959   CO2 20 03/04/2019 0959   GLUCOSE 109 (H) 03/04/2019 0959   GLUCOSE 112 (H) 08/25/2015 1342   BUN 24 03/04/2019 0959   CREATININE 0.99 03/04/2019 0959   CALCIUM 10.0 03/04/2019 0959   GFRNONAA 77 03/04/2019 0959   GFRAA 89 03/04/2019 0959   Lab Results  Component Value Date   HGBA1C 6.5 (H) 03/04/2019   HGBA1C 6.1 (H) 07/10/2017   HGBA1C 6.4 (H) 04/01/2017   Lab Results  Component Value Date   INSULIN 27.8 (H) 03/04/2019   INSULIN 14.0 07/10/2017   INSULIN 14.6 04/01/2017   CBC      Component Value Date/Time   WBC 5.4 04/01/2017 1010   WBC 6.6 08/25/2015 1342   RBC 4.82 04/01/2017 1010   RBC 4.69 08/25/2015 1342   HGB 14.5 04/01/2017 1010   HCT 42.7 04/01/2017 1010   PLT 271 08/25/2015 1342   MCV 89 04/01/2017 1010   MCH 30.1 04/01/2017 1010   MCH 29.9 08/25/2015 1342   MCHC 34.0 04/01/2017 1010   MCHC 33.2 08/25/2015 1342   RDW 14.1 04/01/2017 1010   LYMPHSABS 2.4 04/01/2017 1010   EOSABS 0.1 04/01/2017 1010   BASOSABS 0.0 04/01/2017 1010   Iron/TIBC/Ferritin/ %Sat No results found for: IRON, TIBC, FERRITIN, IRONPCTSAT Lipid Panel     Component Value Date/Time   CHOL 201 (H) 03/04/2019 0959   TRIG 113 03/04/2019 0959   HDL 48 03/04/2019 0959   LDLCALC 130 (H) 03/04/2019 0959   Hepatic Function Panel     Component Value Date/Time   PROT 6.9 03/04/2019 0959   ALBUMIN 4.4 03/04/2019 0959   AST 14 03/04/2019 0959   ALT 22 03/04/2019 0959   ALKPHOS 68 03/04/2019 0959   BILITOT <0.2 03/04/2019 0959      Component Value Date/Time   TSH 1.350 04/01/2017 1010     Ref. Range 03/04/2019 09:59  Vitamin D, 25-Hydroxy Latest Ref Range: 30.0 - 100.0 ng/mL 35.1    I, [place] CraneJoanne Murray, am acting as Energy managertranscriptionist for [employer] Corporationngel A. Manson PasseyBrown, DO  I have reviewed the above documentation for accuracy and completeness, and I agree with the above. -Corinna CapraAngel Brown, DO

## 2019-04-16 ENCOUNTER — Ambulatory Visit (INDEPENDENT_AMBULATORY_CARE_PROVIDER_SITE_OTHER): Payer: Medicare Other | Admitting: *Deleted

## 2019-04-16 DIAGNOSIS — J309 Allergic rhinitis, unspecified: Secondary | ICD-10-CM

## 2019-04-21 DIAGNOSIS — J301 Allergic rhinitis due to pollen: Secondary | ICD-10-CM | POA: Diagnosis not present

## 2019-04-21 NOTE — Progress Notes (Signed)
VIALS EXP 04-20-2020 

## 2019-04-22 DIAGNOSIS — J3089 Other allergic rhinitis: Secondary | ICD-10-CM | POA: Diagnosis not present

## 2019-04-26 DIAGNOSIS — J3089 Other allergic rhinitis: Secondary | ICD-10-CM | POA: Diagnosis not present

## 2019-04-27 ENCOUNTER — Ambulatory Visit (INDEPENDENT_AMBULATORY_CARE_PROVIDER_SITE_OTHER): Payer: Medicare Other | Admitting: Physician Assistant

## 2019-04-27 ENCOUNTER — Other Ambulatory Visit: Payer: Self-pay

## 2019-04-27 ENCOUNTER — Encounter (INDEPENDENT_AMBULATORY_CARE_PROVIDER_SITE_OTHER): Payer: Self-pay | Admitting: Physician Assistant

## 2019-04-27 DIAGNOSIS — R7303 Prediabetes: Secondary | ICD-10-CM

## 2019-04-27 DIAGNOSIS — E559 Vitamin D deficiency, unspecified: Secondary | ICD-10-CM | POA: Diagnosis not present

## 2019-04-27 DIAGNOSIS — Z6839 Body mass index (BMI) 39.0-39.9, adult: Secondary | ICD-10-CM

## 2019-04-27 MED ORDER — VITAMIN D (ERGOCALCIFEROL) 1.25 MG (50000 UNIT) PO CAPS: 50000.0000 [IU] | ORAL_CAPSULE | ORAL | 0 refills | Status: DC

## 2019-04-27 MED ORDER — METFORMIN HCL 500 MG PO TABS: 500.0000 mg | ORAL_TABLET | Freq: Two times a day (BID) | ORAL | 0 refills | Status: DC

## 2019-04-27 NOTE — Progress Notes (Signed)
Office: [phone]  /  Fax: [phone] TeleHealth Visit:  Tyler [name] has verbally consented to this TeleHealth visit today. The patient is located at home, the provider is located at the [facility]. The participants in this visit include the listed provider and patient. The visit was conducted today via FaceTime.  HPI:   Chief Complaint: OBESITY Tyler [name] is here to discuss his progress with his obesity treatment plan. He is on the Category 2 plan and is following his eating plan approximately 99% of the time. He states he is doing yard work.  Tyler [name] states his weight today is 238 lbs. He reports following the plan closely, although sometimes he goes over his snack calories. He wants to start working out more.  We were unable to weigh the patient today for this TeleHealth visit. He feels as if he has lost weight since his last visit. He has lost 0 lbs since starting treatment with us.  Vitamin D deficiency Tyler [name] has a diagnosis of Vitamin D deficiency. He is currently taking weekly prescription Vit D and denies nausea, vomiting or muscle weakness.  Diabetes II Tyler [name] has a diagnosis of diabetes type II. Tyler [name] does not report checking his blood sugars. Last A1c was 6.5 on 03/04/2019. He has been working on intensive lifestyle modifications including diet, exercise, and weight loss to help control his blood glucose levels. No nausea, vomiting, or diarrhea.  ASSESSMENT AND PLAN:  Vitamin D deficiency - Plan: Vitamin D, Ergocalciferol, (DRISDOL) 1.25 MG (50000 UT) CAPS capsule  Prediabetes - Plan: metFORMIN (GLUCOPHAGE) 500 MG tablet  Class 2 severe obesity with serious comorbidity and body mass index (BMI) of 39.0 to 39.9 in adult, unspecified obesity type (HCC)  PLAN:  Vitamin D Deficiency Tyler [name] was informed that low Vitamin D levels contributes to fatigue and are associated with obesity, breast, and colon cancer. He agrees to continue to take  prescription Vit D @ 50,000 IU every week #4 with 0 refills and will follow-up for routine testing of Vitamin D, at least 2-3 times per year. He was informed of the risk of over-replacement of Vitamin D and agrees to not increase his dose unless he discusses this with us first. Tyler [name] agrees to follow-up with our clinic in 2 weeks.  Diabetes II Tyler [name] has been given extensive diabetes education by myself today including ideal fasting and post-prandial blood glucose readings, individual ideal HgA1c goals  and hypoglycemia prevention. We discussed the importance of good blood sugar control to decrease the likelihood of diabetic complications such as nephropathy, neuropathy, limb loss, blindness, coronary artery disease, and death. We discussed the importance of intensive lifestyle modification including diet, exercise and weight loss as the first line treatment for diabetes. Tyler [name] was given a refill on his metformin #60 with 0 refills and agrees to follow-up with our clinic in 2 weeks.  Obesity Tyler [name] is currently in the action stage of change. As such, his goal is to continue with weight loss efforts. He has agreed to follow the Category 2 plan. Tyler [name] has been instructed to work up to a goal of 150 minutes of combined cardio and strengthening exercise per week for weight loss and overall health benefits. We discussed the following Behavioral Modification Strategies today: work on meal planning, easy cooking plans, and keeping healthy foods in the home.  Tyler [name] has agreed to follow-up with our clinic in 2 weeks. He was informed of the importance of frequent follow-up visits to maximize his success with intensive  lifestyle modifications for his multiple health conditions.  ALLERGIES: No Known Allergies  MEDICATIONS: Current Outpatient Medications on File Prior to Visit  Medication Sig Dispense Refill   acetaminophen (TYLENOL) 500 MG tablet Take 500 mg by mouth every 6 (six) hours as needed for mild  pain.     amLODipine (NORVASC) 10 MG tablet TAKE 1 TABLET BY MOUTH EVERY DAY 90 tablet 1   amLODipine (NORVASC) 5 MG tablet Take 5 mg by mouth daily.     Cholecalciferol (VITAMIN D3) 2000 UNITS TABS Take 2,000 Units by mouth daily.     CHROMIUM PO Take 1 tablet by mouth every morning.     dextromethorphan-guaiFENesin (MUCINEX DM) 30-600 MG 12hr tablet Take 1 tablet by mouth 2 (two) times daily as needed for cough.     docusate sodium (COLACE) 100 MG capsule Take 200 mg by mouth at bedtime as needed for mild constipation.     lisinopril-hydrochlorothiazide (PRINZIDE,ZESTORETIC) 20-25 MG tablet Take 1 tablet by mouth daily. 90 tablet 3   Melatonin 10 MG CAPS Take 1 capsule as needed by mouth. 30 capsule 0   multivitamin-iron-minerals-folic acid (CENTRUM) chewable tablet Chew 1 tablet by mouth daily.     Potassium 99 MG TABS Take 99 mg by mouth daily.     QNASL 80 MCG/ACT AERS ONE SPRAY EACH NOSTRIL TWICE DAILY FOR STUFFY NOSE OR DRAINAGE. 8.7 Inhaler 0   rosuvastatin (CRESTOR) 40 MG tablet Take 0.5 tablets (20 mg total) by mouth daily. 30 tablet 0   triamterene-hydrochlorothiazide (MAXZIDE-25) 37.5-25 MG per tablet Take 0.5 tablets by mouth daily.      No current facility-administered medications on file prior to visit.     PAST MEDICAL HISTORY: Past Medical History:  Diagnosis Date   Back pain    Heartburn    Hyperlipidemia    Hypertension    Insulin resistance    Obesity     PAST SURGICAL HISTORY: Past Surgical History:  Procedure Laterality Date   BACK SURGERY     03/14/2004   EYE SURGERY     bil cataract  02/2013   LUMBAR LAMINECTOMY/DECOMPRESSION MICRODISCECTOMY N/A 05/19/2015   Procedure: Lumbar four- five diskectomy;  Surgeon: Ashok Pall, MD;  Location: [place] [facility];  Service: Neurosurgery;  Laterality: N/A;  L45 diskectomy    SOCIAL HISTORY: Social History   Tobacco Use   Smoking status: Former Smoker   Smokeless tobacco: Never Used    Tobacco comment: quit smokling in '70s  Substance Use Topics   Alcohol use: Yes    Alcohol/week: 4.0 standard drinks    Types: 4 Shots of liquor per week    Comment: social    Drug use: No    FAMILY HISTORY: Family History  Problem Relation Age of Onset   Hypertension Father    Heart failure Mother    Hypertension Sister    Heart attack Brother    ROS: Review of Systems  Gastrointestinal: Negative for diarrhea, nausea and vomiting.  Musculoskeletal:       Negative for muscle weakness.   PHYSICAL EXAM: Pt in no acute distress  RECENT LABS AND TESTS: BMET    Component Value Date/Time   NA 138 03/04/2019 0959   K 4.0 03/04/2019 0959   CL 103 03/04/2019 0959   CO2 20 03/04/2019 0959   GLUCOSE 109 (H) 03/04/2019 0959   GLUCOSE 112 (H) 08/25/2015 1342   BUN 24 03/04/2019 0959   CREATININE 0.99 03/04/2019 0959   CALCIUM 10.0 03/04/2019 0959  GFRNONAA 77 03/04/2019 0959   GFRAA 89 03/04/2019 0959   Lab Results  Component Value Date   HGBA1C 6.5 (H) 03/04/2019   HGBA1C 6.1 (H) 07/10/2017   HGBA1C 6.4 (H) 04/01/2017   Lab Results  Component Value Date   INSULIN 27.8 (H) 03/04/2019   INSULIN 14.0 07/10/2017   INSULIN 14.6 04/01/2017   CBC    Component Value Date/Time   WBC 5.4 04/01/2017 1010   WBC 6.6 08/25/2015 1342   RBC 4.82 04/01/2017 1010   RBC 4.69 08/25/2015 1342   HGB 14.5 04/01/2017 1010   HCT 42.7 04/01/2017 1010   PLT 271 08/25/2015 1342   MCV 89 04/01/2017 1010   MCH 30.1 04/01/2017 1010   MCH 29.9 08/25/2015 1342   MCHC 34.0 04/01/2017 1010   MCHC 33.2 08/25/2015 1342   RDW 14.1 04/01/2017 1010   LYMPHSABS 2.4 04/01/2017 1010   EOSABS 0.1 04/01/2017 1010   BASOSABS 0.0 04/01/2017 1010   Iron/TIBC/Ferritin/ %Sat No results found for: IRON, TIBC, FERRITIN, IRONPCTSAT Lipid Panel     Component Value Date/Time   CHOL 201 (H) 03/04/2019 0959   TRIG 113 03/04/2019 0959   HDL 48 03/04/2019 0959   LDLCALC 130 (H) 03/04/2019 0959    Hepatic Function Panel     Component Value Date/Time   PROT 6.9 03/04/2019 0959   ALBUMIN 4.4 03/04/2019 0959   AST 14 03/04/2019 0959   ALT 22 03/04/2019 0959   ALKPHOS 68 03/04/2019 0959   BILITOT <0.2 03/04/2019 0959      Component Value Date/Time   TSH 1.350 04/01/2017 1010   Results for [name], Johnnathan A (MRN [id number]) as of 04/27/2019 10:32  Ref. Range 03/04/2019 09:59  Vitamin D, 25-Hydroxy Latest Ref Range: 30.0 - 100.0 ng/mL 35.1    I, Marianna Paymentenise Haag, am acting as Energy managertranscriptionist for [employer]y Aguilar, PA-C I, Alois Clicheracey Aguilar, PA-C have reviewed above note and agree with its content

## 2019-05-01 ENCOUNTER — Other Ambulatory Visit (INDEPENDENT_AMBULATORY_CARE_PROVIDER_SITE_OTHER): Payer: Self-pay | Admitting: Physician Assistant

## 2019-05-01 DIAGNOSIS — E7800 Pure hypercholesterolemia, unspecified: Secondary | ICD-10-CM

## 2019-05-03 ENCOUNTER — Telehealth (INDEPENDENT_AMBULATORY_CARE_PROVIDER_SITE_OTHER): Payer: Medicare Other | Admitting: Physician Assistant

## 2019-05-10 ENCOUNTER — Telehealth (INDEPENDENT_AMBULATORY_CARE_PROVIDER_SITE_OTHER): Payer: Medicare Other | Admitting: Physician Assistant

## 2019-05-10 ENCOUNTER — Other Ambulatory Visit: Payer: Self-pay

## 2019-05-10 DIAGNOSIS — E119 Type 2 diabetes mellitus without complications: Secondary | ICD-10-CM | POA: Diagnosis not present

## 2019-05-10 DIAGNOSIS — Z6837 Body mass index (BMI) 37.0-37.9, adult: Secondary | ICD-10-CM

## 2019-05-11 NOTE — Progress Notes (Signed)
Office: [phone]  /  Fax: [phone] TeleHealth Visit:  Tyler [name] has verbally consented to this TeleHealth visit today. The patient is located at home, the provider is located at the [facility] office. The participants in this visit include the listed provider and patient. The visit was conducted today via FaceTime.  HPI:   Chief Complaint: OBESITY Tyler [name] is here to discuss his progress with his obesity treatment plan. He is on the Category 2 plan and is following his eating plan approximately 90% of the time. He states he is doing yard work. Tyler [name] states his weight is 235 lbs. He reports that he has been more mobile since getting a cortisone shot in his hip. He is doing well following the plan. We were unable to weigh the patient today for this TeleHealth visit. He feels as if he has maintained his weight since his last visit. He has lost 0 lbs since starting treatment with [place].  Diabetes II Tyler [name] has a diagnosis of diabetes type II. Tyler [name] does not report checking his blood sugars. Last A1c was 6.5 on 03/04/2019. He has been working on intensive lifestyle modifications including diet, exercise, and weight loss to help control his blood glucose levels. No nausea, vomiting, diarrhea, or polyphagia.  ASSESSMENT AND PLAN:  Type 2 diabetes mellitus without complication, without long-term current use of insulin (HCC)  Class 2 severe obesity with serious comorbidity and body mass index (BMI) of 37.0 to 37.9 in adult, unspecified obesity type (Meta)  PLAN:  Diabetes II Tyler [name] has been given extensive diabetes education by myself today including ideal fasting and post-prandial blood glucose readings, individual ideal HgA1c goals  and hypoglycemia prevention. We discussed the importance of good blood sugar control to decrease the likelihood of diabetic complications such as nephropathy, neuropathy, limb loss, blindness, coronary artery disease, and death. We discussed  the importance of intensive lifestyle modification including diet, exercise and weight loss as the first line treatment for diabetes. Eziah agrees to continue his diabetes medications and will follow-up at the agreed upon time.  Obesity Tyler [name] is currently in the action stage of change. As such, his goal is to continue with weight loss efforts. He has agreed to follow the Category 2 plan. Tyler [name] has been instructed to work up to a goal of 150 minutes of combined cardio and strengthening exercise per week for weight loss and overall health benefits. We discussed the following Behavioral Modification Strategies today: work on meal planning and easy cooking plans, and keeping healthy foods in the home.  Tyler [name] has agreed to follow-up with our clinic in 2 weeks. He was informed of the importance of frequent follow-up visits to maximize his success with intensive lifestyle modifications for his multiple health conditions.  ALLERGIES: No Known Allergies  MEDICATIONS: Current Outpatient Medications on File Prior to Visit  Medication Sig Dispense Refill   acetaminophen (TYLENOL) 500 MG tablet Take 500 mg by mouth every 6 (six) hours as needed for mild pain.     amLODipine (NORVASC) 10 MG tablet TAKE 1 TABLET BY MOUTH EVERY DAY 90 tablet 1   amLODipine (NORVASC) 5 MG tablet Take 5 mg by mouth daily.     Cholecalciferol (VITAMIN D3) 2000 UNITS TABS Take 2,000 Units by mouth daily.     CHROMIUM PO Take 1 tablet by mouth every morning.     dextromethorphan-guaiFENesin (MUCINEX DM) 30-600 MG 12hr tablet Take 1 tablet by mouth 2 (two) times daily as needed for cough.  docusate sodium (COLACE) 100 MG capsule Take 200 mg by mouth at bedtime as needed for mild constipation.     lisinopril-hydrochlorothiazide (PRINZIDE,ZESTORETIC) 20-25 MG tablet Take 1 tablet by mouth daily. 90 tablet 3   Melatonin 10 MG CAPS Take 1 capsule as needed by mouth. 30 capsule 0   metFORMIN (GLUCOPHAGE) 500 MG tablet  Take 1 tablet (500 mg total) by mouth 2 (two) times daily with a meal. 60 tablet 0   multivitamin-iron-minerals-folic acid (CENTRUM) chewable tablet Chew 1 tablet by mouth daily.     Potassium 99 MG TABS Take 99 mg by mouth daily.     QNASL 80 MCG/ACT AERS ONE SPRAY EACH NOSTRIL TWICE DAILY FOR STUFFY NOSE OR DRAINAGE. 8.7 Inhaler 0   rosuvastatin (CRESTOR) 40 MG tablet Take 0.5 tablets (20 mg total) by mouth daily. 30 tablet 0   triamterene-hydrochlorothiazide (MAXZIDE-25) 37.5-25 MG per tablet Take 0.5 tablets by mouth daily.      Vitamin D, Ergocalciferol, (DRISDOL) 1.25 MG (50000 UT) CAPS capsule Take 1 capsule (50,000 Units total) by mouth every 7 (seven) days. 4 capsule 0   No current facility-administered medications on file prior to visit.     PAST MEDICAL HISTORY: Past Medical History:  Diagnosis Date   Back pain    Heartburn    Hyperlipidemia    Hypertension    Insulin resistance    Obesity     PAST SURGICAL HISTORY: Past Surgical History:  Procedure Laterality Date   BACK SURGERY     03/14/2004   EYE SURGERY     bil cataract  02/2013   LUMBAR LAMINECTOMY/DECOMPRESSION MICRODISCECTOMY N/A 05/19/2015   Procedure: Lumbar four- five diskectomy;  Surgeon: Coletta MemosKyle Cabbell, MD;  Location: [facility];  Service: Neurosurgery;  Laterality: N/A;  L45 diskectomy    SOCIAL HISTORY: Social History   Tobacco Use   Smoking status: Former Smoker   Smokeless tobacco: Never Used   Tobacco comment: quit smokling in '70s  Substance Use Topics   Alcohol use: Yes    Alcohol/week: 4.0 standard drinks    Types: 4 Shots of liquor per week    Comment: social    Drug use: No    FAMILY HISTORY: Family History  Problem Relation Age of Onset   Hypertension Father    Heart failure Mother    Hypertension Sister    Heart attack Brother    ROS: Review of Systems  Gastrointestinal: Negative for diarrhea, nausea and vomiting.  Endo/Heme/Allergies:       Negative  for polyphagia.   PHYSICAL EXAM: Pt in no acute distress  RECENT LABS AND TESTS: BMET    Component Value Date/Time   NA 138 03/04/2019 0959   K 4.0 03/04/2019 0959   CL 103 03/04/2019 0959   CO2 20 03/04/2019 0959   GLUCOSE 109 (H) 03/04/2019 0959   GLUCOSE 112 (H) 08/25/2015 1342   BUN 24 03/04/2019 0959   CREATININE 0.99 03/04/2019 0959   CALCIUM 10.0 03/04/2019 0959   GFRNONAA 77 03/04/2019 0959   GFRAA 89 03/04/2019 0959   Lab Results  Component Value Date   HGBA1C 6.5 (H) 03/04/2019   HGBA1C 6.1 (H) 07/10/2017   HGBA1C 6.4 (H) 04/01/2017   Lab Results  Component Value Date   INSULIN 27.8 (H) 03/04/2019   INSULIN 14.0 07/10/2017   INSULIN 14.6 04/01/2017   CBC    Component Value Date/Time   WBC 5.4 04/01/2017 1010   WBC 6.6 08/25/2015 1342   RBC 4.82  04/01/2017 1010   RBC 4.69 08/25/2015 1342   HGB 14.5 04/01/2017 1010   HCT 42.7 04/01/2017 1010   PLT 271 08/25/2015 1342   MCV 89 04/01/2017 1010   MCH 30.1 04/01/2017 1010   MCH 29.9 08/25/2015 1342   MCHC 34.0 04/01/2017 1010   MCHC 33.2 08/25/2015 1342   RDW 14.1 04/01/2017 1010   LYMPHSABS 2.4 04/01/2017 1010   EOSABS 0.1 04/01/2017 1010   BASOSABS 0.0 04/01/2017 1010   Iron/TIBC/Ferritin/ %Sat No results found for: IRON, TIBC, FERRITIN, IRONPCTSAT Lipid Panel     Component Value Date/Time   CHOL 201 (H) 03/04/2019 0959   TRIG 113 03/04/2019 0959   HDL 48 03/04/2019 0959   LDLCALC 130 (H) 03/04/2019 0959   Hepatic Function Panel     Component Value Date/Time   PROT 6.9 03/04/2019 0959   ALBUMIN 4.4 03/04/2019 0959   AST 14 03/04/2019 0959   ALT 22 03/04/2019 0959   ALKPHOS 68 03/04/2019 0959   BILITOT <0.2 03/04/2019 0959      Component Value Date/Time   TSH 1.350 04/01/2017 1010   Results for [name], Asaf A (MRN [id number]) as of 05/11/2019 14:48  Ref. Range 03/04/2019 09:59  Vitamin D, 25-Hydroxy Latest Ref Range: 30.0 - 100.0 ng/mL 35.1   I, Marianna Paymentenise Haag, am acting as  Energy managertranscriptionist for [employer] Aguilar, PA-C I, Alois Clicheracey Aguilar, PA-C have reviewed above note and agree with its content

## 2019-05-14 ENCOUNTER — Ambulatory Visit (INDEPENDENT_AMBULATORY_CARE_PROVIDER_SITE_OTHER): Payer: Medicare Other | Admitting: *Deleted

## 2019-05-14 DIAGNOSIS — J309 Allergic rhinitis, unspecified: Secondary | ICD-10-CM

## 2019-05-24 ENCOUNTER — Other Ambulatory Visit: Payer: Self-pay

## 2019-05-24 ENCOUNTER — Encounter (INDEPENDENT_AMBULATORY_CARE_PROVIDER_SITE_OTHER): Payer: Self-pay | Admitting: Physician Assistant

## 2019-05-24 ENCOUNTER — Telehealth (INDEPENDENT_AMBULATORY_CARE_PROVIDER_SITE_OTHER): Payer: Medicare Other | Admitting: Physician Assistant

## 2019-05-24 DIAGNOSIS — E559 Vitamin D deficiency, unspecified: Secondary | ICD-10-CM | POA: Diagnosis not present

## 2019-05-24 DIAGNOSIS — E119 Type 2 diabetes mellitus without complications: Secondary | ICD-10-CM | POA: Diagnosis not present

## 2019-05-24 DIAGNOSIS — Z6839 Body mass index (BMI) 39.0-39.9, adult: Secondary | ICD-10-CM

## 2019-05-24 MED ORDER — VITAMIN D (ERGOCALCIFEROL) 1.25 MG (50000 UNIT) PO CAPS: 50000.0000 [IU] | ORAL_CAPSULE | ORAL | 0 refills | Status: DC

## 2019-05-24 NOTE — Progress Notes (Signed)
Office: [phone]  /  Fax: [phone] TeleHealth Visit:  Tyler [name] has verbally consented to this TeleHealth visit today. The patient is located at home, the provider is located at the [facility]. The participants in this visit include the listed provider, patient, and the patient's wife, Tyler [name]. The visit was conducted today via FaceTime.  HPI:   Chief Complaint: OBESITY Tyler [name] is here to discuss his progress with his obesity treatment plan. He is on the Category 2 plan and is following his eating plan approximately 95% of the time. He states he is doing yard work a few times a week. Tyler [name] reports his weight to be 233 lbs. He is doing well following the plan and is doing extra yard work for exercise.  We were unable to weigh the patient today for this TeleHealth visit. He feels as if he has lost weight since his last visit. He has lost 0 lbs since starting treatment with us.  Vitamin D deficiency Tyler [name] has a diagnosis of Vitamin D deficiency. He is currently taking prescription Vit D and denies nausea, vomiting or muscle weakness.  Diabetes II Tyler [name] has a diagnosis of diabetes type II and is on metformin. Tyler [name] does not report checking his blood sugars. Last A1c was 6.5 on 03/04/2019. He has been working on intensive lifestyle modifications including diet, exercise, and weight loss to help control his blood glucose levels. No nausea, vomiting, or diarrhea.  ASSESSMENT AND PLAN:  Type 2 diabetes mellitus without complication, without long-term current use of insulin (HCC)  Vitamin D deficiency - Plan: Vitamin D, Ergocalciferol, (DRISDOL) 1.25 MG (50000 UT) CAPS capsule  Class 2 severe obesity with serious comorbidity and body mass index (BMI) of 39.0 to 39.9 in adult, unspecified obesity type (HCC)  PLAN:  Vitamin D Deficiency Tyler [name] was informed that low Vitamin D levels contributes to fatigue and are associated with obesity, breast, and colon  cancer. He agrees to continue to take prescription Vit D @ 50,000 IU every week #4 with 0 refills and will follow-up for routine testing of Vitamin D, at least 2-3 times per year. He was informed of the risk of over-replacement of Vitamin D and agrees to not increase his dose unless he discusses this with us first. Tyler [name] agrees to follow-up with our clinic in 2 weeks.  Diabetes II Tyler [name] has been given extensive diabetes education by myself today including ideal fasting and post-prandial blood glucose readings, individual ideal HgA1c goals  and hypoglycemia prevention. We discussed the importance of good blood sugar control to decrease the likelihood of diabetic complications such as nephropathy, neuropathy, limb loss, blindness, coronary artery disease, and death. We discussed the importance of intensive lifestyle modification including diet, exercise and weight loss as the first line treatment for diabetes. Tyler [name] agrees to continue his diabetes medications and will follow-up at the agreed upon time.  Obesity Tyler [name] is currently in the action stage of change. As such, his goal is to continue with weight loss efforts. He has agreed to follow the Category 2 plan. Tyler [name] has been instructed to work up to a goal of 150 minutes of combined cardio and strengthening exercise per week for weight loss and overall health benefits. We discussed the following Behavioral Modification Strategies today: work on meal planning and easy cooking plans, and keeping healthy foods in the home.  Tyler [name] has agreed to follow-up with our clinic in 2 weeks. He was informed of the importance of frequent follow-up visits to maximize  his success with intensive lifestyle modifications for his multiple health conditions.  ALLERGIES: No Known Allergies  MEDICATIONS: Current Outpatient Medications on File Prior to Visit  Medication Sig Dispense Refill   acetaminophen (TYLENOL) 500 MG tablet Take 500 mg by mouth every 6 (six) hours  as needed for mild pain.     amLODipine (NORVASC) 10 MG tablet TAKE 1 TABLET BY MOUTH EVERY DAY 90 tablet 1   amLODipine (NORVASC) 5 MG tablet Take 5 mg by mouth daily.     Cholecalciferol (VITAMIN D3) 2000 UNITS TABS Take 2,000 Units by mouth daily.     CHROMIUM PO Take 1 tablet by mouth every morning.     dextromethorphan-guaiFENesin (MUCINEX DM) 30-600 MG 12hr tablet Take 1 tablet by mouth 2 (two) times daily as needed for cough.     docusate sodium (COLACE) 100 MG capsule Take 200 mg by mouth at bedtime as needed for mild constipation.     lisinopril-hydrochlorothiazide (PRINZIDE,ZESTORETIC) 20-25 MG tablet Take 1 tablet by mouth daily. 90 tablet 3   Melatonin 10 MG CAPS Take 1 capsule as needed by mouth. 30 capsule 0   metFORMIN (GLUCOPHAGE) 500 MG tablet Take 1 tablet (500 mg total) by mouth 2 (two) times daily with a meal. 60 tablet 0   multivitamin-iron-minerals-folic acid (CENTRUM) chewable tablet Chew 1 tablet by mouth daily.     Potassium 99 MG TABS Take 99 mg by mouth daily.     QNASL 80 MCG/ACT AERS ONE SPRAY EACH NOSTRIL TWICE DAILY FOR STUFFY NOSE OR DRAINAGE. 8.7 Inhaler 0   rosuvastatin (CRESTOR) 40 MG tablet Take 0.5 tablets (20 mg total) by mouth daily. 30 tablet 0   triamterene-hydrochlorothiazide (MAXZIDE-25) 37.5-25 MG per tablet Take 0.5 tablets by mouth daily.      No current facility-administered medications on file prior to visit.     PAST MEDICAL HISTORY: Past Medical History:  Diagnosis Date   Back pain    Heartburn    Hyperlipidemia    Hypertension    Insulin resistance    Obesity     PAST SURGICAL HISTORY: Past Surgical History:  Procedure Laterality Date   BACK SURGERY     03/14/2004   EYE SURGERY     bil cataract  02/2013   LUMBAR LAMINECTOMY/DECOMPRESSION MICRODISCECTOMY N/A 05/19/2015   Procedure: Lumbar four- five diskectomy;  Surgeon: Tyler Pall, MD;  Location: [facility];  Service: Neurosurgery;  Laterality: N/A;  L45  diskectomy    SOCIAL HISTORY: Social History   Tobacco Use   Smoking status: Former Smoker   Smokeless tobacco: Never Used   Tobacco comment: quit smokling in '70s  Substance Use Topics   Alcohol use: Yes    Alcohol/week: 4.0 standard drinks    Types: 4 Shots of liquor per week    Comment: social    Drug use: No    FAMILY HISTORY: Family History  Problem Relation Age of Onset   Hypertension Father    Heart failure Mother    Hypertension Sister    Heart attack Brother    ROS: Review of Systems  Gastrointestinal: Negative for diarrhea, nausea and vomiting.  Musculoskeletal:       Negative for muscle weakness.   PHYSICAL EXAM: Pt in no acute distress  RECENT LABS AND TESTS: BMET    Component Value Date/Time   NA 138 03/04/2019 0959   K 4.0 03/04/2019 0959   CL 103 03/04/2019 0959   CO2 20 03/04/2019 0959   GLUCOSE 109 (  H) 03/04/2019 0959   GLUCOSE 112 (H) 08/25/2015 1342   BUN 24 03/04/2019 0959   CREATININE 0.99 03/04/2019 0959   CALCIUM 10.0 03/04/2019 0959   GFRNONAA 77 03/04/2019 0959   GFRAA 89 03/04/2019 0959   Lab Results  Component Value Date   HGBA1C 6.5 (H) 03/04/2019   HGBA1C 6.1 (H) 07/10/2017   HGBA1C 6.4 (H) 04/01/2017   Lab Results  Component Value Date   INSULIN 27.8 (H) 03/04/2019   INSULIN 14.0 07/10/2017   INSULIN 14.6 04/01/2017   CBC    Component Value Date/Time   WBC 5.4 04/01/2017 1010   WBC 6.6 08/25/2015 1342   RBC 4.82 04/01/2017 1010   RBC 4.69 08/25/2015 1342   HGB 14.5 04/01/2017 1010   HCT 42.7 04/01/2017 1010   PLT 271 08/25/2015 1342   MCV 89 04/01/2017 1010   MCH 30.1 04/01/2017 1010   MCH 29.9 08/25/2015 1342   MCHC 34.0 04/01/2017 1010   MCHC 33.2 08/25/2015 1342   RDW 14.1 04/01/2017 1010   LYMPHSABS 2.4 04/01/2017 1010   EOSABS 0.1 04/01/2017 1010   BASOSABS 0.0 04/01/2017 1010   Iron/TIBC/Ferritin/ %Sat No results found for: IRON, TIBC, FERRITIN, IRONPCTSAT Lipid Panel     Component  Value Date/Time   CHOL 201 (H) 03/04/2019 0959   TRIG 113 03/04/2019 0959   HDL 48 03/04/2019 0959   LDLCALC 130 (H) 03/04/2019 0959   Hepatic Function Panel     Component Value Date/Time   PROT 6.9 03/04/2019 0959   ALBUMIN 4.4 03/04/2019 0959   AST 14 03/04/2019 0959   ALT 22 03/04/2019 0959   ALKPHOS 68 03/04/2019 0959   BILITOT <0.2 03/04/2019 0959      Component Value Date/Time   TSH 1.350 04/01/2017 1010   Results for [name], Ihan A (MRN [id number]) as of 05/24/2019 15:16  Ref. Range 03/04/2019 09:59  Vitamin D, 25-Hydroxy Latest Ref Range: 30.0 - 100.0 ng/mL 35.1   I, Tyler Paymentenise Haag, am acting as Energy managertranscriptionist for [employer] Agu[employer], PA-C I, Alois Clicheracey Aguilar, PA-C have reviewed above note and agree with its content

## 2019-05-26 ENCOUNTER — Ambulatory Visit (INDEPENDENT_AMBULATORY_CARE_PROVIDER_SITE_OTHER): Payer: Medicare Other | Admitting: *Deleted

## 2019-05-26 DIAGNOSIS — J309 Allergic rhinitis, unspecified: Secondary | ICD-10-CM

## 2019-06-07 ENCOUNTER — Encounter (INDEPENDENT_AMBULATORY_CARE_PROVIDER_SITE_OTHER): Payer: Self-pay | Admitting: Physician Assistant

## 2019-06-07 ENCOUNTER — Other Ambulatory Visit: Payer: Self-pay

## 2019-06-07 ENCOUNTER — Telehealth (INDEPENDENT_AMBULATORY_CARE_PROVIDER_SITE_OTHER): Payer: Medicare Other | Admitting: Physician Assistant

## 2019-06-07 DIAGNOSIS — E119 Type 2 diabetes mellitus without complications: Secondary | ICD-10-CM | POA: Diagnosis not present

## 2019-06-07 DIAGNOSIS — Z6839 Body mass index (BMI) 39.0-39.9, adult: Secondary | ICD-10-CM | POA: Diagnosis not present

## 2019-06-09 NOTE — Progress Notes (Signed)
Office: [phone]  /  Fax: [phone] TeleHealth Visit:  Tyler [name] has verbally consented to this TeleHealth visit today. The patient is located at home, the provider is located at the [facility] office. The participants in this visit include the listed provider and patient and patient's wife. The visit was conducted today via face time.  HPI:   Chief Complaint: OBESITY Tyler [name] is here to discuss his progress with his obesity treatment plan. He is on the Category 2 plan and is following his eating plan approximately 90 % of the time. He states he is doing yard work 3 times per week. Tyler [name] reports his most recent weight is 234 lbs. He reports eating fried fish twice and McDonald's once. He wants to start walking for exercise.  We were unable to weigh the patient today for this TeleHealth visit. He feels as if he has maintained his weight since his last visit. He has lost 0 lbs since starting treatment with us.  Diabetes II Tyler [name] has a diagnosis of diabetes type II. Tyler [name] is on metformin and denies nausea, vomiting, or diarrhea. Last A1c was 6.5. He denies hypoglycemia. He has been working on intensive lifestyle modifications including diet, exercise, and weight loss to help control his blood glucose levels.  ASSESSMENT AND PLAN:  Type 2 diabetes mellitus without complication, without long-term current use of insulin (HCC)  Class 2 severe obesity with serious comorbidity and body mass index (BMI) of 39.0 to 39.9 in adult, unspecified obesity type (HCC)  PLAN:  Diabetes II Tyler [name] has been given extensive diabetes education by myself today including ideal fasting and post-prandial blood glucose readings, individual ideal Hgb A1c goals and hypoglycemia prevention. We discussed the importance of good blood sugar control to decrease the likelihood of diabetic complications such as nephropathy, neuropathy, limb loss, blindness, coronary artery disease, and death. We  discussed the importance of intensive lifestyle modification including diet, exercise and weight loss as the first line treatment for diabetes. Tyler [name] agrees to continue taking metformin, and he agrees to follow up with our clinic in 2 weeks.  I spent > than 50% of the 15 minute visit on counseling as documented in the note.  Obesity Tyler [name] is currently in the action stage of change. As such, his goal is to continue with weight loss efforts He has agreed to change to keep a food journal with 1200 calories and 85 grams of protein daily Tyler [name] has been instructed to work up to a goal of 150 minutes of combined cardio and strengthening exercise per week for weight loss and overall health benefits. We discussed the following Behavioral Modification Strategies today: work on meal planning and easy cooking plans and planning for success   Tyler [name] has agreed to follow up with our clinic in 2 weeks. He was informed of the importance of frequent follow up visits to maximize his success with intensive lifestyle modifications for his multiple health conditions.  ALLERGIES: No Known Allergies  MEDICATIONS: Current Outpatient Medications on File Prior to Visit  Medication Sig Dispense Refill  . acetaminophen (TYLENOL) 500 MG tablet Take 500 mg by mouth every 6 (six) hours as needed for mild pain.    Marland Kitchen. amLODipine (NORVASC) 10 MG tablet TAKE 1 TABLET BY MOUTH EVERY DAY 90 tablet 1  . amLODipine (NORVASC) 5 MG tablet Take 5 mg by mouth daily.    . Cholecalciferol (VITAMIN D3) 2000 UNITS TABS Take 2,000 Units by mouth daily.    . CHROMIUM PO  Take 1 tablet by mouth every morning.    Marland Kitchen dextromethorphan-guaiFENesin (MUCINEX DM) 30-600 MG 12hr tablet Take 1 tablet by mouth 2 (two) times daily as needed for cough.    . docusate sodium (COLACE) 100 MG capsule Take 200 mg by mouth at bedtime as needed for mild constipation.    Marland Kitchen lisinopril-hydrochlorothiazide (PRINZIDE,ZESTORETIC) 20-25 MG tablet Take 1 tablet by  mouth daily. 90 tablet 3  . Melatonin 10 MG CAPS Take 1 capsule as needed by mouth. 30 capsule 0  . metFORMIN (GLUCOPHAGE) 500 MG tablet Take 1 tablet (500 mg total) by mouth 2 (two) times daily with a meal. 60 tablet 0  . multivitamin-iron-minerals-folic acid (CENTRUM) chewable tablet Chew 1 tablet by mouth daily.    . Potassium 99 MG TABS Take 99 mg by mouth daily.    . QNASL 80 MCG/ACT AERS ONE SPRAY EACH NOSTRIL TWICE DAILY FOR STUFFY NOSE OR DRAINAGE. 8.7 Inhaler 0  . rosuvastatin (CRESTOR) 40 MG tablet Take 0.5 tablets (20 mg total) by mouth daily. 30 tablet 0  . triamterene-hydrochlorothiazide (MAXZIDE-25) 37.5-25 MG per tablet Take 0.5 tablets by mouth daily.     . Vitamin D, Ergocalciferol, (DRISDOL) 1.25 MG (50000 UT) CAPS capsule Take 1 capsule (50,000 Units total) by mouth every 7 (seven) days. 4 capsule 0   No current facility-administered medications on file prior to visit.     PAST MEDICAL HISTORY: Past Medical History:  Diagnosis Date  . Back pain   . Heartburn   . Hyperlipidemia   . Hypertension   . Insulin resistance   . Obesity     PAST SURGICAL HISTORY: Past Surgical History:  Procedure Laterality Date  . BACK SURGERY     03/14/2004  . EYE SURGERY     bil cataract  02/2013  . LUMBAR LAMINECTOMY/DECOMPRESSION MICRODISCECTOMY N/A 05/19/2015   Procedure: Lumbar four- five diskectomy;  Surgeon: Ashok Pall, MD;  Location: [facility];  Service: Neurosurgery;  Laterality: N/A;  L45 diskectomy    SOCIAL HISTORY: Social History   Tobacco Use  . Smoking status: Former Research scientist (life sciences)  . Smokeless tobacco: Never Used  . Tobacco comment: quit smokling in '70s  Substance Use Topics  . Alcohol use: Yes    Alcohol/week: 4.0 standard drinks    Types: 4 Shots of liquor per week    Comment: social   . Drug use: No    FAMILY HISTORY: Family History  Problem Relation Age of Onset  . Hypertension Father   . Heart failure Mother   . Hypertension Sister   . Heart attack  Brother     ROS: Review of Systems  Constitutional: Negative for weight loss.  Gastrointestinal: Negative for diarrhea, nausea and vomiting.  Endo/Heme/Allergies:       Negative hypoglycemia    PHYSICAL EXAM: Pt in no acute distress  RECENT LABS AND TESTS: BMET    Component Value Date/Time   NA 138 03/04/2019 0959   K 4.0 03/04/2019 0959   CL 103 03/04/2019 0959   CO2 20 03/04/2019 0959   GLUCOSE 109 (H) 03/04/2019 0959   GLUCOSE 112 (H) 08/25/2015 1342   BUN 24 03/04/2019 0959   CREATININE 0.99 03/04/2019 0959   CALCIUM 10.0 03/04/2019 0959   GFRNONAA 77 03/04/2019 0959   GFRAA 89 03/04/2019 0959   Lab Results  Component Value Date   HGBA1C 6.5 (H) 03/04/2019   HGBA1C 6.1 (H) 07/10/2017   HGBA1C 6.4 (H) 04/01/2017   Lab Results  Component Value  Date   INSULIN 27.8 (H) 03/04/2019   INSULIN 14.0 07/10/2017   INSULIN 14.6 04/01/2017   CBC    Component Value Date/Time   WBC 5.4 04/01/2017 1010   WBC 6.6 08/25/2015 1342   RBC 4.82 04/01/2017 1010   RBC 4.69 08/25/2015 1342   HGB 14.5 04/01/2017 1010   HCT 42.7 04/01/2017 1010   PLT 271 08/25/2015 1342   MCV 89 04/01/2017 1010   MCH 30.1 04/01/2017 1010   MCH 29.9 08/25/2015 1342   MCHC 34.0 04/01/2017 1010   MCHC 33.2 08/25/2015 1342   RDW 14.1 04/01/2017 1010   LYMPHSABS 2.4 04/01/2017 1010   EOSABS 0.1 04/01/2017 1010   BASOSABS 0.0 04/01/2017 1010   Iron/TIBC/Ferritin/ %Sat No results found for: IRON, TIBC, FERRITIN, IRONPCTSAT Lipid Panel     Component Value Date/Time   CHOL 201 (H) 03/04/2019 0959   TRIG 113 03/04/2019 0959   HDL 48 03/04/2019 0959   LDLCALC 130 (H) 03/04/2019 0959   Hepatic Function Panel     Component Value Date/Time   PROT 6.9 03/04/2019 0959   ALBUMIN 4.4 03/04/2019 0959   AST 14 03/04/2019 0959   ALT 22 03/04/2019 0959   ALKPHOS 68 03/04/2019 0959   BILITOT <0.2 03/04/2019 0959      Component Value Date/Time   TSH 1.350 04/01/2017 1010      I, Burt KnackSharon  Starlette Thurow, am acting as transcriptionist for Alois Clicheracey Aguilar, PA-C I, Alois Clicheracey Aguilar, PA-C have reviewed above note and agree with its content

## 2019-06-11 ENCOUNTER — Ambulatory Visit (INDEPENDENT_AMBULATORY_CARE_PROVIDER_SITE_OTHER): Payer: Medicare Other

## 2019-06-11 DIAGNOSIS — J309 Allergic rhinitis, unspecified: Secondary | ICD-10-CM

## 2019-06-20 ENCOUNTER — Other Ambulatory Visit (INDEPENDENT_AMBULATORY_CARE_PROVIDER_SITE_OTHER): Payer: Self-pay | Admitting: Physician Assistant

## 2019-06-20 DIAGNOSIS — R7303 Prediabetes: Secondary | ICD-10-CM

## 2019-06-22 ENCOUNTER — Encounter (INDEPENDENT_AMBULATORY_CARE_PROVIDER_SITE_OTHER): Payer: Self-pay | Admitting: Physician Assistant

## 2019-06-22 ENCOUNTER — Telehealth (INDEPENDENT_AMBULATORY_CARE_PROVIDER_SITE_OTHER): Payer: Medicare Other | Admitting: Physician Assistant

## 2019-06-22 ENCOUNTER — Other Ambulatory Visit: Payer: Self-pay

## 2019-06-22 DIAGNOSIS — E119 Type 2 diabetes mellitus without complications: Secondary | ICD-10-CM | POA: Diagnosis not present

## 2019-06-22 DIAGNOSIS — R7303 Prediabetes: Secondary | ICD-10-CM

## 2019-06-22 DIAGNOSIS — E559 Vitamin D deficiency, unspecified: Secondary | ICD-10-CM | POA: Diagnosis not present

## 2019-06-22 DIAGNOSIS — Z6839 Body mass index (BMI) 39.0-39.9, adult: Secondary | ICD-10-CM

## 2019-06-22 MED ORDER — VITAMIN D (ERGOCALCIFEROL) 1.25 MG (50000 UNIT) PO CAPS: 50000.0000 [IU] | ORAL_CAPSULE | ORAL | 0 refills | Status: DC

## 2019-06-22 MED ORDER — METFORMIN HCL 500 MG PO TABS: 500.0000 mg | ORAL_TABLET | Freq: Two times a day (BID) | ORAL | 0 refills | Status: DC

## 2019-06-23 NOTE — Progress Notes (Signed)
Office: [phone]  /  Fax: [phone] TeleHealth Visit:  Tyler [name] has verbally consented to this TeleHealth visit today. The patient is located at home, the provider is located at the [facility] office. The participants in this visit include the listed provider, patient and wife, and any and all parties involved. The visit was conducted today via FaceTime.  HPI:   Chief Complaint: OBESITY Tyler [name] is here to discuss his progress with his obesity treatment plan. He is on the Category 2 plan and is following his eating plan approximately 90 % of the time. He states he is exercising 0 minutes 0 times per week. Tyler [name] reports most recent weight of 230 pounds (06/22/19). He reports following the plan closely. Tyler [name] has not been journaling as discussed, but he has been following the Category 2 plan. We were unable to weigh the patient today for this TeleHealth visit. He feels as if he has lost weight since his last visit. He has lost 0 lbs since starting treatment with [place].  Diabetes II Tyler [name] has a diagnosis of diabetes type II. Tyler [name] denies nausea, vomiting or diarrhea on metformin. He denies polyphagia. Last A1c was at 6.5 He has been working on intensive lifestyle modifications including diet, exercise, and weight loss to help control his blood glucose levels.  Vitamin D deficiency Tyler [name] has a diagnosis of vitamin D deficiency. He is currently taking vit D and denies nausea, vomiting or muscle weakness.  ASSESSMENT AND PLAN:  Type 2 diabetes mellitus without complication, without long-term current use of insulin (HCC)  Vitamin D deficiency - Plan: Vitamin D, Ergocalciferol, (DRISDOL) 1.25 MG (50000 UT) CAPS capsule  Class 2 severe obesity with serious comorbidity and body mass index (BMI) of 39.0 to 39.9 in adult, unspecified obesity type (HCC)  Prediabetes - Plan: metFORMIN (GLUCOPHAGE) 500 MG tablet  PLAN:  Diabetes II Tyler [name] has been given extensive  diabetes education by myself today including ideal fasting and post-prandial blood glucose readings, individual ideal Hgb A1c goals and hypoglycemia prevention. We discussed the importance of good blood sugar control to decrease the likelihood of diabetic complications such as nephropathy, neuropathy, limb loss, blindness, coronary artery disease, and death. We discussed the importance of intensive lifestyle modification including diet, exercise and weight loss as the first line treatment for diabetes. Kathan agrees to continue metformin 500 mg two times daily #60 with no refills and follow up at the agreed upon time.  Vitamin D Deficiency Tyler [name] was informed that low vitamin D levels contributes to fatigue and are associated with obesity, breast, and colon cancer. He agrees to continue to take prescription Vit D @50 ,000 IU every week #4 with no refills and will follow up for routine testing of vitamin D, at least 2-3 times per year. He was informed of the risk of over-replacement of vitamin D and agrees to not increase his dose unless he discusses this with [place] first. Keita agrees to follow up with our clinic in 2 weeks.  Obesity Tyler [name] is currently in the action stage of change. As such, his goal is to continue with weight loss efforts He has agreed to keep a food journal with 1200 calories and 85 grams of protein daily Tyler [name] has been instructed to work up to a goal of 150 minutes of combined cardio and strengthening exercise per week for weight loss and overall health benefits. We discussed the following Behavioral Modification Strategies today: keeping healthy foods in the home and work on meal planning and  easy cooking plans  Tyler FearingJames has agreed to follow up with our clinic in 2 weeks. He was informed of the importance of frequent follow up visits to maximize his success with intensive lifestyle modifications for his multiple health conditions.  ALLERGIES: No Known Allergies  MEDICATIONS: Current  Outpatient Medications on File Prior to Visit  Medication Sig Dispense Refill   acetaminophen (TYLENOL) 500 MG tablet Take 500 mg by mouth every 6 (six) hours as needed for mild pain.     amLODipine (NORVASC) 10 MG tablet TAKE 1 TABLET BY MOUTH EVERY DAY 90 tablet 1   amLODipine (NORVASC) 5 MG tablet Take 5 mg by mouth daily.     Cholecalciferol (VITAMIN D3) 2000 UNITS TABS Take 2,000 Units by mouth daily.     CHROMIUM PO Take 1 tablet by mouth every morning.     dextromethorphan-guaiFENesin (MUCINEX DM) 30-600 MG 12hr tablet Take 1 tablet by mouth 2 (two) times daily as needed for cough.     docusate sodium (COLACE) 100 MG capsule Take 200 mg by mouth at bedtime as needed for mild constipation.     lisinopril-hydrochlorothiazide (PRINZIDE,ZESTORETIC) 20-25 MG tablet Take 1 tablet by mouth daily. 90 tablet 3   Melatonin 10 MG CAPS Take 1 capsule as needed by mouth. 30 capsule 0   multivitamin-iron-minerals-folic acid (CENTRUM) chewable tablet Chew 1 tablet by mouth daily.     Potassium 99 MG TABS Take 99 mg by mouth daily.     QNASL 80 MCG/ACT AERS ONE SPRAY EACH NOSTRIL TWICE DAILY FOR STUFFY NOSE OR DRAINAGE. 8.7 Inhaler 0   rosuvastatin (CRESTOR) 40 MG tablet Take 0.5 tablets (20 mg total) by mouth daily. 30 tablet 0   triamterene-hydrochlorothiazide (MAXZIDE-25) 37.5-25 MG per tablet Take 0.5 tablets by mouth daily.      No current facility-administered medications on file prior to visit.     PAST MEDICAL HISTORY: Past Medical History:  Diagnosis Date   Back pain    Heartburn    Hyperlipidemia    Hypertension    Insulin resistance    Obesity     PAST SURGICAL HISTORY: Past Surgical History:  Procedure Laterality Date   BACK SURGERY     03/14/2004   EYE SURGERY     bil cataract  02/2013   LUMBAR LAMINECTOMY/DECOMPRESSION MICRODISCECTOMY N/A 05/19/2015   Procedure: Lumbar four- five diskectomy;  Surgeon: Coletta MemosKyle Cabbell, MD;  Location: [facility];  Service:  Neurosurgery;  Laterality: N/A;  L45 diskectomy    SOCIAL HISTORY: Social History   Tobacco Use   Smoking status: Former Smoker   Smokeless tobacco: Never Used   Tobacco comment: quit smokling in '70s  Substance Use Topics   Alcohol use: Yes    Alcohol/week: 4.0 standard drinks    Types: 4 Shots of liquor per week    Comment: social    Drug use: No    FAMILY HISTORY: Family History  Problem Relation Age of Onset   Hypertension Father    Heart failure Mother    Hypertension Sister    Heart attack Brother     ROS: Review of Systems  Constitutional: Positive for weight loss.  Gastrointestinal: Negative for diarrhea, nausea and vomiting.  Musculoskeletal:       Negative for muscle weakness  Endo/Heme/Allergies:       Negative for polyphagia    PHYSICAL EXAM: Pt in no acute distress  RECENT LABS AND TESTS: BMET    Component Value Date/Time   NA 138 03/04/2019  0959   K 4.0 03/04/2019 0959   CL 103 03/04/2019 0959   CO2 20 03/04/2019 0959   GLUCOSE 109 (H) 03/04/2019 0959   GLUCOSE 112 (H) 08/25/2015 1342   BUN 24 03/04/2019 0959   CREATININE 0.99 03/04/2019 0959   CALCIUM 10.0 03/04/2019 0959   GFRNONAA 77 03/04/2019 0959   GFRAA 89 03/04/2019 0959   Lab Results  Component Value Date   HGBA1C 6.5 (H) 03/04/2019   HGBA1C 6.1 (H) 07/10/2017   HGBA1C 6.4 (H) 04/01/2017   Lab Results  Component Value Date   INSULIN 27.8 (H) 03/04/2019   INSULIN 14.0 07/10/2017   INSULIN 14.6 04/01/2017   CBC    Component Value Date/Time   WBC 5.4 04/01/2017 1010   WBC 6.6 08/25/2015 1342   RBC 4.82 04/01/2017 1010   RBC 4.69 08/25/2015 1342   HGB 14.5 04/01/2017 1010   HCT 42.7 04/01/2017 1010   PLT 271 08/25/2015 1342   MCV 89 04/01/2017 1010   MCH 30.1 04/01/2017 1010   MCH 29.9 08/25/2015 1342   MCHC 34.0 04/01/2017 1010   MCHC 33.2 08/25/2015 1342   RDW 14.1 04/01/2017 1010   LYMPHSABS 2.4 04/01/2017 1010   EOSABS 0.1 04/01/2017 1010   BASOSABS  0.0 04/01/2017 1010   Iron/TIBC/Ferritin/ %Sat No results found for: IRON, TIBC, FERRITIN, IRONPCTSAT Lipid Panel     Component Value Date/Time   CHOL 201 (H) 03/04/2019 0959   TRIG 113 03/04/2019 0959   HDL 48 03/04/2019 0959   LDLCALC 130 (H) 03/04/2019 0959   Hepatic Function Panel     Component Value Date/Time   PROT 6.9 03/04/2019 0959   ALBUMIN 4.4 03/04/2019 0959   AST 14 03/04/2019 0959   ALT 22 03/04/2019 0959   ALKPHOS 68 03/04/2019 0959   BILITOT <0.2 03/04/2019 0959      Component Value Date/Time   TSH 1.350 04/01/2017 1010     Ref. Range 03/04/2019 09:59  Vitamin D, 25-Hydroxy Latest Ref Range: 30.0 - 100.0 ng/mL 35.1    I, Doreene Nest, am acting as Location manager for Abby Potash, PA-C I, Abby Potash, PA-C have reviewed above note and agree with its content

## 2019-06-29 ENCOUNTER — Ambulatory Visit (INDEPENDENT_AMBULATORY_CARE_PROVIDER_SITE_OTHER): Payer: Medicare Other | Admitting: *Deleted

## 2019-06-29 DIAGNOSIS — J309 Allergic rhinitis, unspecified: Secondary | ICD-10-CM | POA: Diagnosis not present

## 2019-07-05 ENCOUNTER — Encounter (INDEPENDENT_AMBULATORY_CARE_PROVIDER_SITE_OTHER): Payer: Self-pay | Admitting: Physician Assistant

## 2019-07-05 ENCOUNTER — Telehealth (INDEPENDENT_AMBULATORY_CARE_PROVIDER_SITE_OTHER): Payer: Medicare Other | Admitting: Physician Assistant

## 2019-07-05 ENCOUNTER — Ambulatory Visit (INDEPENDENT_AMBULATORY_CARE_PROVIDER_SITE_OTHER): Payer: Medicare Other

## 2019-07-05 ENCOUNTER — Other Ambulatory Visit: Payer: Self-pay

## 2019-07-05 DIAGNOSIS — J309 Allergic rhinitis, unspecified: Secondary | ICD-10-CM

## 2019-07-05 DIAGNOSIS — Z6839 Body mass index (BMI) 39.0-39.9, adult: Secondary | ICD-10-CM

## 2019-07-05 DIAGNOSIS — E559 Vitamin D deficiency, unspecified: Secondary | ICD-10-CM | POA: Diagnosis not present

## 2019-07-07 NOTE — Progress Notes (Signed)
Office: [phone]  /  Fax: [phone] TeleHealth Visit:  Tyler [name] has verbally consented to this TeleHealth visit today. The patient is located at home, the provider is located at the [facility] office. The participants in this visit include the listed provider, patient, wife and any and all parties involved. The visit was conducted today via FaceTime.  HPI:   Chief Complaint: OBESITY Tyler [name] is here to discuss his progress with his obesity treatment plan. He is on the Category 2 plan and is following his eating plan approximately 90 % of the time. He states he is doing yard work for exercise. [name] most recent weight was 228 pounds (07/05/19). [name] reports following the plan closely and he is doing well. We were unable to weigh the patient today for this TeleHealth visit. He feels as if he has lost weight since his last visit. He has lost 0 lbs since starting treatment with [place].  Vitamin D deficiency Tyler [name] has a diagnosis of vitamin D deficiency. Tyler [name] is currently taking vit D and he denies nausea, vomiting or muscle weakness.  ASSESSMENT AND PLAN:  Vitamin D deficiency  Class 2 severe obesity with serious comorbidity and body mass index (BMI) of 39.0 to 39.9 in adult, unspecified obesity type (HCC)  PLAN:  Vitamin D Deficiency Tyler [name] was informed that low vitamin D levels contributes to fatigue and are associated with obesity, breast, and colon cancer. He agrees to continue to take prescription Vit D @50 ,000 IU every week and will follow up for routine testing of vitamin D, at least 2-3 times per year. He was informed of the risk of over-replacement of vitamin D and agrees to not increase his dose unless he discusses this with [place] first.  Obesity Tyler [name] is currently in the action stage of change. As such, his goal is to continue with weight loss efforts He has agreed to follow the Category 2 plan Tyler [name] has been instructed to work up to a goal of 150  minutes of combined cardio and strengthening exercise per week for weight loss and overall health benefits. We discussed the following Behavioral Modification Strategies today: keeping healthy foods in the home and work on meal planning and easy cooking plans   Tyler [name] has agreed to follow up with our clinic in 2 weeks. He was informed of the importance of frequent follow up visits to maximize his success with intensive lifestyle modifications for his multiple health conditions.  ALLERGIES: No Known Allergies  MEDICATIONS: Current Outpatient Medications on File Prior to Visit  Medication Sig Dispense Refill   acetaminophen (TYLENOL) 500 MG tablet Take 500 mg by mouth every 6 (six) hours as needed for mild pain.     amLODipine (NORVASC) 10 MG tablet TAKE 1 TABLET BY MOUTH EVERY DAY 90 tablet 1   amLODipine (NORVASC) 5 MG tablet Take 5 mg by mouth daily.     Cholecalciferol (VITAMIN D3) 2000 UNITS TABS Take 2,000 Units by mouth daily.     CHROMIUM PO Take 1 tablet by mouth every morning.     dextromethorphan-guaiFENesin (MUCINEX DM) 30-600 MG 12hr tablet Take 1 tablet by mouth 2 (two) times daily as needed for cough.     docusate sodium (COLACE) 100 MG capsule Take 200 mg by mouth at bedtime as needed for mild constipation.     lisinopril-hydrochlorothiazide (PRINZIDE,ZESTORETIC) 20-25 MG tablet Take 1 tablet by mouth daily. 90 tablet 3   Melatonin 10 MG CAPS Take 1 capsule as needed by mouth.  30 capsule 0   metFORMIN (GLUCOPHAGE) 500 MG tablet Take 1 tablet (500 mg total) by mouth 2 (two) times daily with a meal. 60 tablet 0   multivitamin-iron-minerals-folic acid (CENTRUM) chewable tablet Chew 1 tablet by mouth daily.     Potassium 99 MG TABS Take 99 mg by mouth daily.     QNASL 80 MCG/ACT AERS ONE SPRAY EACH NOSTRIL TWICE DAILY FOR STUFFY NOSE OR DRAINAGE. 8.7 Inhaler 0   rosuvastatin (CRESTOR) 40 MG tablet Take 0.5 tablets (20 mg total) by mouth daily. 30 tablet 0    triamterene-hydrochlorothiazide (MAXZIDE-25) 37.5-25 MG per tablet Take 0.5 tablets by mouth daily.      Vitamin D, Ergocalciferol, (DRISDOL) 1.25 MG (50000 UT) CAPS capsule Take 1 capsule (50,000 Units total) by mouth every 7 (seven) days. 4 capsule 0   No current facility-administered medications on file prior to visit.     PAST MEDICAL HISTORY: Past Medical History:  Diagnosis Date   Back pain    Heartburn    Hyperlipidemia    Hypertension    Insulin resistance    Obesity     PAST SURGICAL HISTORY: Past Surgical History:  Procedure Laterality Date   BACK SURGERY     03/14/2004   EYE SURGERY     bil cataract  02/2013   LUMBAR LAMINECTOMY/DECOMPRESSION MICRODISCECTOMY N/A 05/19/2015   Procedure: Lumbar four- five diskectomy;  Surgeon: Ashok Pall, MD;  Location: [facility];  Service: Neurosurgery;  Laterality: N/A;  L45 diskectomy    SOCIAL HISTORY: Social History   Tobacco Use   Smoking status: Former Smoker   Smokeless tobacco: Never Used   Tobacco comment: quit smokling in '70s  Substance Use Topics   Alcohol use: Yes    Alcohol/week: 4.0 standard drinks    Types: 4 Shots of liquor per week    Comment: social    Drug use: No    FAMILY HISTORY: Family History  Problem Relation Age of Onset   Hypertension Father    Heart failure Mother    Hypertension Sister    Heart attack Brother     ROS: Review of Systems  Constitutional: Positive for weight loss.  Gastrointestinal: Negative for nausea and vomiting.  Musculoskeletal:       Negative for muscle weakness    PHYSICAL EXAM: Pt in no acute distress  RECENT LABS AND TESTS: BMET    Component Value Date/Time   NA 138 03/04/2019 0959   K 4.0 03/04/2019 0959   CL 103 03/04/2019 0959   CO2 20 03/04/2019 0959   GLUCOSE 109 (H) 03/04/2019 0959   GLUCOSE 112 (H) 08/25/2015 1342   BUN 24 03/04/2019 0959   CREATININE 0.99 03/04/2019 0959   CALCIUM 10.0 03/04/2019 0959   GFRNONAA 77  03/04/2019 0959   GFRAA 89 03/04/2019 0959   Lab Results  Component Value Date   HGBA1C 6.5 (H) 03/04/2019   HGBA1C 6.1 (H) 07/10/2017   HGBA1C 6.4 (H) 04/01/2017   Lab Results  Component Value Date   INSULIN 27.8 (H) 03/04/2019   INSULIN 14.0 07/10/2017   INSULIN 14.6 04/01/2017   CBC    Component Value Date/Time   WBC 5.4 04/01/2017 1010   WBC 6.6 08/25/2015 1342   RBC 4.82 04/01/2017 1010   RBC 4.69 08/25/2015 1342   HGB 14.5 04/01/2017 1010   HCT 42.7 04/01/2017 1010   PLT 271 08/25/2015 1342   MCV 89 04/01/2017 1010   MCH 30.1 04/01/2017 1010   MCH  29.9 08/25/2015 1342   MCHC 34.0 04/01/2017 1010   MCHC 33.2 08/25/2015 1342   RDW 14.1 04/01/2017 1010   LYMPHSABS 2.4 04/01/2017 1010   EOSABS 0.1 04/01/2017 1010   BASOSABS 0.0 04/01/2017 1010   Iron/TIBC/Ferritin/ %Sat No results found for: IRON, TIBC, FERRITIN, IRONPCTSAT Lipid Panel     Component Value Date/Time   CHOL 201 (H) 03/04/2019 0959   TRIG 113 03/04/2019 0959   HDL 48 03/04/2019 0959   LDLCALC 130 (H) 03/04/2019 0959   Hepatic Function Panel     Component Value Date/Time   PROT 6.9 03/04/2019 0959   ALBUMIN 4.4 03/04/2019 0959   AST 14 03/04/2019 0959   ALT 22 03/04/2019 0959   ALKPHOS 68 03/04/2019 0959   BILITOT <0.2 03/04/2019 0959      Component Value Date/Time   TSH 1.350 04/01/2017 1010     Ref. Range 03/04/2019 09:59  Vitamin D, 25-Hydroxy Latest Ref Range: 30.0 - 100.0 ng/mL 35.1    I, [place] CraneJoanne Murray, am acting as Energy managertranscriptionist for Alois Clicheracey Aguilar, PA-C I, Alois Clicheracey Aguilar, PA-C have reviewed above note and agree with its content

## 2019-07-13 ENCOUNTER — Ambulatory Visit (INDEPENDENT_AMBULATORY_CARE_PROVIDER_SITE_OTHER): Payer: Medicare Other | Admitting: *Deleted

## 2019-07-13 DIAGNOSIS — J309 Allergic rhinitis, unspecified: Secondary | ICD-10-CM

## 2019-07-19 ENCOUNTER — Encounter (INDEPENDENT_AMBULATORY_CARE_PROVIDER_SITE_OTHER): Payer: Self-pay | Admitting: Physician Assistant

## 2019-07-19 ENCOUNTER — Encounter: Payer: Self-pay | Admitting: Physician Assistant

## 2019-07-19 ENCOUNTER — Other Ambulatory Visit: Payer: Self-pay

## 2019-07-19 ENCOUNTER — Ambulatory Visit (INDEPENDENT_AMBULATORY_CARE_PROVIDER_SITE_OTHER): Payer: Medicare Other | Admitting: Physician Assistant

## 2019-07-19 VITALS — BP 125/72 | HR 58 | Temp 98.4°F | Ht 65.0 in | Wt 231.0 lb

## 2019-07-19 DIAGNOSIS — Z6838 Body mass index (BMI) 38.0-38.9, adult: Secondary | ICD-10-CM | POA: Diagnosis not present

## 2019-07-19 DIAGNOSIS — E7849 Other hyperlipidemia: Secondary | ICD-10-CM | POA: Diagnosis not present

## 2019-07-19 DIAGNOSIS — E559 Vitamin D deficiency, unspecified: Secondary | ICD-10-CM

## 2019-07-19 DIAGNOSIS — E119 Type 2 diabetes mellitus without complications: Secondary | ICD-10-CM

## 2019-07-19 DIAGNOSIS — R7303 Prediabetes: Secondary | ICD-10-CM

## 2019-07-19 DIAGNOSIS — R0602 Shortness of breath: Secondary | ICD-10-CM

## 2019-07-19 MED ORDER — METFORMIN HCL 500 MG PO TABS: 500.0000 mg | ORAL_TABLET | Freq: Two times a day (BID) | ORAL | 0 refills | Status: DC

## 2019-07-20 LAB — VITAMIN D 25 HYDROXY (VIT D DEFICIENCY, FRACTURES): Vit D, 25-Hydroxy: 51.1 ng/mL (ref 30.0–100.0)

## 2019-07-20 LAB — LIPID PANEL WITH LDL/HDL RATIO
Cholesterol, Total: 182 mg/dL (ref 100–199)
HDL: 54 mg/dL (ref >39)
LDL Chol Calc (NIH): 109 mg/dL — ABNORMAL HIGH (ref 0–99)
LDL/HDL Ratio: 2 ratio (ref 0.0–3.6)
Triglycerides: 103 mg/dL (ref 0–149)
VLDL Cholesterol Cal: 19 mg/dL (ref 5–40)

## 2019-07-20 LAB — HEMOGLOBIN A1C
Est. average glucose Bld gHb Est-mCnc: 137 mg/dL
Hgb A1c MFr Bld: 6.4 % — ABNORMAL HIGH (ref 4.8–5.6)

## 2019-07-20 LAB — COMPREHENSIVE METABOLIC PANEL
ALT: 20 IU/L (ref 0–44)
AST: 13 IU/L (ref 0–40)
Albumin/Globulin Ratio: 2 (ref 1.2–2.2)
Albumin: 4.3 g/dL (ref 3.7–4.7)
Alkaline Phosphatase: 73 IU/L (ref 39–117)
BUN/Creatinine Ratio: 15 (ref 10–24)
BUN: 17 mg/dL (ref 8–27)
Bilirubin Total: <0.2 mg/dL (ref 0.0–1.2)
CO2: 24 mmol/L (ref 20–29)
Calcium: 10 mg/dL (ref 8.6–10.2)
Chloride: 105 mmol/L (ref 96–106)
Creatinine, Ser: 1.16 mg/dL (ref 0.76–1.27)
GFR calc Af Amer: 73 mL/min/{1.73_m2} (ref >59)
GFR calc non Af Amer: 63 mL/min/{1.73_m2} (ref >59)
Globulin, Total: 2.1 g/dL (ref 1.5–4.5)
Glucose: 102 mg/dL — ABNORMAL HIGH (ref 65–99)
Potassium: 4.3 mmol/L (ref 3.5–5.2)
Sodium: 142 mmol/L (ref 134–144)
Total Protein: 6.4 g/dL (ref 6.0–8.5)

## 2019-07-20 LAB — INSULIN, RANDOM: INSULIN: 14.8 u[IU]/mL (ref 2.6–24.9)

## 2019-07-20 NOTE — Progress Notes (Signed)
Office: [phone]  /  Fax: [phone]   HPI:   Chief Complaint: OBESITY Tyler Tyler [name] is here to discuss his progress with his obesity treatment plan. He is on the  follow the Category 2 plan and is following his eating plan approximately 90 % of the time. He states he is exercising 0 minutes 0 times per week. Tyler Tyler [name] reports that they have been eating out more for lunch and he has been over drinking his calories. He wants to start working out regularly.  His weight is 231 lb (104.8 kg) today and has had a weight loss of 6 pounds over a period of 8 months since his last in office visit. He has lost 5 lbs since starting treatment with [place].  Diabetes II Tyler [name] has a diagnosis of diabetes type II. Tyler Tyler [name] is currently on Metformin and denies any hypoglycemic episodes. Last A1c was 6.4.  He has been working on intensive lifestyle modifications including diet, Tyler [name], and weight loss to help control his blood glucose levels.  Tyler Tyler [name] Tyler Tyler [name] notes increasing shortness of breath with exercising and seems to be worsening over time with weight gain. He notes getting out of breath sooner with activity than he used to. This has not gotten worse recently. Tyler Tyler [name] denies shortness of breath at rest, dizziness, or orthopnea.  Hyperlipidemia Tyler Tyler [name] has hyperlipidemia and has been trying to improve his cholesterol levels with intensive lifestyle modification including a low saturated fat diet, Tyler [name] and weight loss. He denies any chest pain, claudication or myalgias. She is currently on Crestor.   Vitamin D deficiency Tyler Tyler [name] has a diagnosis of vitamin D deficiency. He is currently taking vit D and denies nausea, vomiting or muscle weakness.   ASSESSMENT AND PLAN:  Type 2 diabetes mellitus without complication, without long-term current use of insulin (HCC) - Plan: Comprehensive metabolic panel, Hemoglobin A1c, Insulin, random  SOB (shortness of breath) on exertion  Other hyperlipidemia - Plan:  Lipid Panel With LDL/HDL Ratio  Vitamin D deficiency - Plan: VITAMIN D 25 Hydroxy (Vit-D Deficiency, Fractures)  Class 2 severe obesity with serious comorbidity and body mass index (BMI) of 38.0 to 38.9 in adult, unspecified obesity type (HCC)  Prediabetes - Plan: metFORMIN (GLUCOPHAGE) 500 MG tablet  PLAN:  Diabetes II Tyler [name] has been given extensive diabetes education by myself today including ideal fasting and post-prandial blood glucose readings, individual ideal HgA1c goals  and hypoglycemia prevention. We discussed the importance of good blood sugar control to decrease the likelihood of diabetic complications such as nephropathy, neuropathy, limb loss, blindness, coronary artery disease, and death. We discussed the importance of intensive lifestyle modification including diet, Tyler [name] and weight loss as the first line treatment for diabetes. [name] agrees to continue his diabetes medications and we will refill Metformin 500 mg BID #60 with no refills. We will repeat HgA1c, CMP, and Insulin today. He will follow up at the agreed upon time.  Tyler Tyler [name] [name]'s shortness of breath appears to be obesity related and Tyler [name] induced. The indirect calorimeter results showed VO2 of 286 and a REE of 1988. He has agreed to work on weight loss and gradually increase Tyler [name] to treat his Tyler [name] induced shortness of breath. If Tyler Tyler [name] follows our instructions and loses weight without improvement of his shortness of breath, we will plan to refer to pulmonology. Tyler Tyler [name] agrees to this plan.  Hyperlipidemia Tyler Tyler [name] has hyperlipidemia and has been trying to improve his cholesterol levels with intensive lifestyle modification including a low saturated fat diet, Tyler [name]  and weight loss. He denies any chest pain, claudication or myalgias. We will repeat lipid panel today.   Vitamin D Deficiency Tyler Tyler [name] was informed that low vitamin D levels contributes to fatigue and are associated with obesity,  breast, and colon cancer. He agrees to continue to take prescription Vit D @50 ,000 IU every week and will follow up for routine testing of vitamin D, at least 2-3 times per year. He was informed of the risk of over-replacement of vitamin D and agrees to not increase his dose unless he discusses this with [place] first. We will check vit D level today. Agrees to follow up with our clinic as directed.   Obesity Tyler Tyler [name] is currently in the action stage of change. As such, his goal is to continue with weight loss efforts He has agreed to change plan and follow the Category 3 plan Tyler Tyler [name] has been instructed to work up to a goal of 150 minutes of combined cardio and strengthening Tyler [name] per week for weight loss and overall health benefits. We discussed the following Behavioral Modification Strategies today: work on meal planning and easy cooking plans and keeping healthy foods in the home.    Tyler Tyler [name] has agreed to follow up with our clinic in 3 weeks. He was informed of the importance of frequent follow up visits to maximize his success with intensive lifestyle modifications for his multiple health conditions.  ALLERGIES: No Known Allergies  MEDICATIONS: Current Outpatient Medications on File Prior to Visit  Medication Sig Dispense Refill  . acetaminophen (TYLENOL) 500 MG tablet Take 500 mg by mouth every 6 (six) hours as needed for mild pain.    Marland Kitchen amLODipine (NORVASC) 10 MG tablet TAKE 1 TABLET BY MOUTH EVERY DAY 90 tablet 1  . amLODipine (NORVASC) 5 MG tablet Take 5 mg by mouth daily.    . Cholecalciferol (VITAMIN D3) 2000 UNITS TABS Take 2,000 Units by mouth daily.    . CHROMIUM PO Take 1 tablet by mouth every morning.    Marland Kitchen dextromethorphan-guaiFENesin (MUCINEX DM) 30-600 MG 12hr tablet Take 1 tablet by mouth 2 (two) times daily as needed for cough.    . docusate sodium (COLACE) 100 MG capsule Take 200 mg by mouth at bedtime as needed for mild constipation.    Marland Kitchen lisinopril-hydrochlorothiazide  (PRINZIDE,ZESTORETIC) 20-25 MG tablet Take 1 tablet by mouth daily. 90 tablet 3  . Melatonin 10 MG CAPS Take 1 capsule as needed by mouth. 30 capsule 0  . multivitamin-iron-minerals-folic acid (CENTRUM) chewable tablet Chew 1 tablet by mouth daily.    . Potassium 99 MG TABS Take 99 mg by mouth daily.    . QNASL 80 MCG/ACT AERS ONE SPRAY EACH NOSTRIL TWICE DAILY FOR STUFFY NOSE OR DRAINAGE. 8.7 Inhaler 0  . rosuvastatin (CRESTOR) 40 MG tablet Take 0.5 tablets (20 mg total) by mouth daily. 30 tablet 0  . triamterene-hydrochlorothiazide (MAXZIDE-25) 37.5-25 MG per tablet Take 0.5 tablets by mouth daily.     . Vitamin D, Ergocalciferol, (DRISDOL) 1.25 MG (50000 UT) CAPS capsule Take 1 capsule (50,000 Units total) by mouth every 7 (seven) days. 4 capsule 0   No current facility-administered medications on file prior to visit.     PAST MEDICAL HISTORY: Past Medical History:  Diagnosis Date  . Back pain   . Heartburn   . Hyperlipidemia   . Hypertension   . Insulin resistance   . Obesity     PAST SURGICAL HISTORY: Past Surgical History:  Procedure Laterality Date  . BACK SURGERY  03/14/2004  . EYE SURGERY     bil cataract  02/2013  . LUMBAR LAMINECTOMY/DECOMPRESSION MICRODISCECTOMY N/A 05/19/2015   Procedure: Lumbar four- five diskectomy;  Surgeon: Coletta MemosKyle Cabbell, MD;  Location: [facility];  Service: Neurosurgery;  Laterality: N/A;  L45 diskectomy    SOCIAL HISTORY: Social History   Tobacco Use  . Smoking status: Former Games developermoker  . Smokeless tobacco: Never Used  . Tobacco comment: quit smokling in '70s  Substance Use Topics  . Alcohol use: Yes    Alcohol/week: 4.0 standard drinks    Types: 4 Shots of liquor per week    Comment: social   . Drug use: No    FAMILY HISTORY: Family History  Problem Relation Age of Onset  . Hypertension Father   . Heart failure Mother   . Hypertension Sister   . Heart attack Brother     ROS: Review of Systems  Constitutional: Positive for  weight loss.  Respiratory: Positive for shortness of breath (with Tyler [name]).   Cardiovascular: Negative for chest pain and claudication.  Gastrointestinal: Negative for diarrhea, nausea and vomiting.  Musculoskeletal: Negative for myalgias.       Negative for muscle weakness  Endo/Heme/Allergies:       Negative for hypoglycemia    PHYSICAL EXAM: Blood pressure 125/72, pulse (!) 58, temperature 98.4 F (36.9 C), temperature source Oral, height 5\' 5"  (1.651 m), weight 231 lb (104.8 kg), SpO2 98 %. Body mass index is 38.44 kg/m. Physical Exam Vitals signs reviewed.  Constitutional:      Appearance: Normal appearance. He is obese.  HENT:     Head: Normocephalic.     Nose: Nose normal.  Neck:     Musculoskeletal: Normal range of motion.  Cardiovascular:     Rate and Rhythm: Normal rate.  Pulmonary:     Effort: Pulmonary effort is normal.  Musculoskeletal: Normal range of motion.  Skin:    General: Skin is warm and dry.  Neurological:     Mental Status: He is alert and oriented to person, place, and time.  Psychiatric:        Mood and Affect: Mood normal.        Behavior: Behavior normal.     RECENT LABS AND TESTS: BMET    Component Value Date/Time   NA 142 07/19/2019 1205   K 4.3 07/19/2019 1205   CL 105 07/19/2019 1205   CO2 24 07/19/2019 1205   GLUCOSE 102 (H) 07/19/2019 1205   GLUCOSE 112 (H) 08/25/2015 1342   BUN 17 07/19/2019 1205   CREATININE 1.16 07/19/2019 1205   CALCIUM 10.0 07/19/2019 1205   GFRNONAA 63 07/19/2019 1205   GFRAA 73 07/19/2019 1205   Lab Results  Component Value Date   HGBA1C 6.4 (H) 07/19/2019   HGBA1C 6.5 (H) 03/04/2019   HGBA1C 6.1 (H) 07/10/2017   HGBA1C 6.4 (H) 04/01/2017   Lab Results  Component Value Date   INSULIN 14.8 07/19/2019   INSULIN 27.8 (H) 03/04/2019   INSULIN 14.0 07/10/2017   INSULIN 14.6 04/01/2017   CBC    Component Value Date/Time   WBC 5.4 04/01/2017 1010   WBC 6.6 08/25/2015 1342   RBC 4.82 04/01/2017  1010   RBC 4.69 08/25/2015 1342   HGB 14.5 04/01/2017 1010   HCT 42.7 04/01/2017 1010   PLT 271 08/25/2015 1342   MCV 89 04/01/2017 1010   MCH 30.1 04/01/2017 1010   MCH 29.9 08/25/2015 1342   MCHC 34.0 04/01/2017 1010  MCHC 33.2 08/25/2015 1342   RDW 14.1 04/01/2017 1010   LYMPHSABS 2.4 04/01/2017 1010   EOSABS 0.1 04/01/2017 1010   BASOSABS 0.0 04/01/2017 1010   Iron/TIBC/Ferritin/ %Sat No results found for: IRON, TIBC, FERRITIN, IRONPCTSAT Lipid Panel     Component Value Date/Time   CHOL 182 07/19/2019 1205   TRIG 103 07/19/2019 1205   HDL 54 07/19/2019 1205   LDLCALC 109 (H) 07/19/2019 1205   Hepatic Function Panel     Component Value Date/Time   PROT 6.4 07/19/2019 1205   ALBUMIN 4.3 07/19/2019 1205   AST 13 07/19/2019 1205   ALT 20 07/19/2019 1205   ALKPHOS 73 07/19/2019 1205   BILITOT <0.2 07/19/2019 1205      Component Value Date/Time   TSH 1.350 04/01/2017 1010    Ref. Range 03/04/2019 09:59  Vitamin D, 25-Hydroxy Latest Ref Range: 30.0 - 100.0 ng/mL 35.1     OBESITY BEHAVIORAL INTERVENTION VISIT  Today's visit was # 33  Starting weight: 236 lbs Starting date: 04/01/17 Today's weight : Weight: 231 lb (104.8 kg)  Today's date:07/19/19 Total lbs lost to date: 5 lbs At least 15 minutes were spent on discussing the following behavioral intervention visit.   ASK: We discussed the diagnosis of obesity with Tyler Tyler [name] today and Rayshon agreed to give [place] permission to discuss obesity behavioral modification therapy today.  ASSESS: Kendale has the diagnosis of obesity and his BMI today is 38.44 Vane is in the action stage of change   ADVISE: Crayton was educated on the multiple health risks of obesity as well as the benefit of weight loss to improve his health. He was advised of the need for long term treatment and the importance of lifestyle modifications to improve his current health and to decrease his risk of future health problems.  AGREE:  Multiple dietary modification options and treatment options were discussed and  Tavi agreed to follow the recommendations documented in the above note.  ARRANGE: Trice was educated on the importance of frequent visits to treat obesity as outlined per CMS and USPSTF guidelines and agreed to schedule his next follow up appointment today.  Otis Peak, am acting as transcriptionist for Alois Cliche, PA-C  I, Alois Cliche, PA-C have reviewed above note and agree with its content

## 2019-07-26 ENCOUNTER — Ambulatory Visit (INDEPENDENT_AMBULATORY_CARE_PROVIDER_SITE_OTHER): Payer: Medicare Other | Admitting: *Deleted

## 2019-07-26 DIAGNOSIS — J309 Allergic rhinitis, unspecified: Secondary | ICD-10-CM | POA: Diagnosis not present

## 2019-08-09 ENCOUNTER — Ambulatory Visit (INDEPENDENT_AMBULATORY_CARE_PROVIDER_SITE_OTHER): Payer: Medicare Other

## 2019-08-09 ENCOUNTER — Ambulatory Visit (INDEPENDENT_AMBULATORY_CARE_PROVIDER_SITE_OTHER): Payer: Medicare Other | Admitting: Physician Assistant

## 2019-08-09 ENCOUNTER — Other Ambulatory Visit: Payer: Self-pay

## 2019-08-09 VITALS — BP 124/82 | HR 62 | Temp 98.6°F | Ht 65.0 in | Wt 231.0 lb

## 2019-08-09 DIAGNOSIS — E559 Vitamin D deficiency, unspecified: Secondary | ICD-10-CM | POA: Diagnosis not present

## 2019-08-09 DIAGNOSIS — J309 Allergic rhinitis, unspecified: Secondary | ICD-10-CM | POA: Diagnosis not present

## 2019-08-09 DIAGNOSIS — Z6838 Body mass index (BMI) 38.0-38.9, adult: Secondary | ICD-10-CM

## 2019-08-09 DIAGNOSIS — E119 Type 2 diabetes mellitus without complications: Secondary | ICD-10-CM

## 2019-08-11 NOTE — Progress Notes (Signed)
Office: [phone]  /  Fax: [phone]   HPI:   Chief Complaint: OBESITY Tyler [name] is here to discuss his progress with his obesity treatment plan. He is on the Category 3 plan and is following his eating plan approximately 95 % of the time. He states he is doing yard work for exercise. Tyler [name] reports that he thinks he does not get all of his protein in daily, but especially at lunch. His weight is 231 lb (104.8 kg) today and he has maintained weight since his last visit. He has lost 5 lbs since starting treatment with [place].  Vitamin D deficiency Tyler [name] has a diagnosis of vitamin D deficiency. He is currently taking vit D and his last vitamin D level was at 51.1 and was at goal. He denies nausea, vomiting or muscle weakness.  Diabetes II Tyler [name] has a diagnosis of diabetes type II. Tyler [name] is on metformin and he denies nausea, vomiting or diarrhea. He denies polyphagia or hypoglycemia. Last A1c was at 6.4 He has been working on intensive lifestyle modifications including diet, exercise, and weight loss to help control his blood glucose levels.  ASSESSMENT AND PLAN:  Vitamin D deficiency  Type 2 diabetes mellitus without complication, without long-term current use of insulin (HCC)  Class 2 severe obesity with serious comorbidity and body mass index (BMI) of 38.0 to 38.9 in adult, unspecified obesity type ([place])  PLAN:  Vitamin D Deficiency Tyler [name] was informed that low vitamin D levels contributes to fatigue and are associated with obesity, breast, and colon cancer. Canon agrees to change to OTC vitamin D 5,000 IU daily and discontinue prescription vitamin D 50,000 IU. Tyler [name] will follow up for routine testing of vitamin D, at least 2-3 times per year. He was informed of the risk of over-replacement of vitamin D and agrees to not increase his dose unless he discusses this with [place] first. Tyler [name] agrees to follow up with our clinic in 3 weeks.  Diabetes II Tyler [name] has been given extensive diabetes  education by myself today including ideal fasting and post-prandial blood glucose readings, individual ideal Hgb A1c goals and hypoglycemia prevention. We discussed the importance of good blood sugar control to decrease the likelihood of diabetic complications such as nephropathy, neuropathy, limb loss, blindness, coronary artery disease, and death. We discussed the importance of intensive lifestyle modification including diet, exercise and weight loss as the first line treatment for diabetes. Tyler [name] agrees to continue with medications and weight loss and he will follow up at the agreed upon time.  Obesity Tyler [name] is currently in the action stage of change. As such, his goal is to continue with weight loss efforts He has agreed to follow the Category 3 plan Tyler [name] has been instructed to work up to a goal of 150 minutes of combined cardio and strengthening exercise per week for weight loss and overall health benefits. We discussed the following Behavioral Modification Strategies today: no skipping meals and work on meal planning and easy cooking plans  Tyler [name] has agreed to follow up with our clinic in 3 weeks. He was informed of the importance of frequent follow up visits to maximize his success with intensive lifestyle modifications for his multiple health conditions.  ALLERGIES: No Known Allergies  MEDICATIONS: Current Outpatient Medications on File Prior to Visit  Medication Sig Dispense Refill   acetaminophen (TYLENOL) 500 MG tablet Take 500 mg by mouth every 6 (six) hours as needed for mild pain.     amLODipine (NORVASC) 10 MG tablet TAKE 1  TABLET BY MOUTH EVERY DAY 90 tablet 1   amLODipine (NORVASC) 5 MG tablet Take 5 mg by mouth daily.     Cholecalciferol (VITAMIN D3) 2000 UNITS TABS Take 2,000 Units by mouth daily.     CHROMIUM PO Take 1 tablet by mouth every morning.     dextromethorphan-guaiFENesin (MUCINEX DM) 30-600 MG 12hr tablet Take 1 tablet by mouth 2 (two) times daily as needed  for cough.     docusate sodium (COLACE) 100 MG capsule Take 200 mg by mouth at bedtime as needed for mild constipation.     lisinopril-hydrochlorothiazide (PRINZIDE,ZESTORETIC) 20-25 MG tablet Take 1 tablet by mouth daily. 90 tablet 3   Melatonin 10 MG CAPS Take 1 capsule as needed by mouth. 30 capsule 0   metFORMIN (GLUCOPHAGE) 500 MG tablet Take 1 tablet (500 mg total) by mouth 2 (two) times daily with a meal. 60 tablet 0   multivitamin-iron-minerals-folic acid (CENTRUM) chewable tablet Chew 1 tablet by mouth daily.     Potassium 99 MG TABS Take 99 mg by mouth daily.     QNASL 80 MCG/ACT AERS ONE SPRAY EACH NOSTRIL TWICE DAILY FOR STUFFY NOSE OR DRAINAGE. 8.7 Inhaler 0   rosuvastatin (CRESTOR) 40 MG tablet Take 0.5 tablets (20 mg total) by mouth daily. 30 tablet 0   triamterene-hydrochlorothiazide (MAXZIDE-25) 37.5-25 MG per tablet Take 0.5 tablets by mouth daily.      No current facility-administered medications on file prior to visit.     PAST MEDICAL HISTORY: Past Medical History:  Diagnosis Date   Back pain    Heartburn    Hyperlipidemia    Hypertension    Insulin resistance    Obesity     PAST SURGICAL HISTORY: Past Surgical History:  Procedure Laterality Date   BACK SURGERY     03/14/2004   EYE SURGERY     bil cataract  02/2013   LUMBAR LAMINECTOMY/DECOMPRESSION MICRODISCECTOMY N/A 05/19/2015   Procedure: Lumbar four- five diskectomy;  Surgeon: Coletta Memos, MD;  Location: [facility];  Service: Neurosurgery;  Laterality: N/A;  L45 diskectomy    SOCIAL HISTORY: Social History   Tobacco Use   Smoking status: Former Smoker   Smokeless tobacco: Never Used   Tobacco comment: quit smokling in '70s  Substance Use Topics   Alcohol use: Yes    Alcohol/week: 4.0 standard drinks    Types: 4 Shots of liquor per week    Comment: social    Drug use: No    FAMILY HISTORY: Family History  Problem Relation Age of Onset   Hypertension Father     Heart failure Mother    Hypertension Sister    Heart attack Brother     ROS: Review of Systems  Constitutional: Negative for weight loss.  Gastrointestinal: Negative for diarrhea, nausea and vomiting.  Musculoskeletal:       Negative for muscle weakness  Endo/Heme/Allergies:       Negative for polyphagia Negative for hypoglycemia    PHYSICAL EXAM: Blood pressure 124/82, pulse 62, temperature 98.6 F (37 C), temperature source Oral, height 5\' 5"  (1.651 m), weight 231 lb (104.8 kg), SpO2 100 %. Body mass index is 38.44 kg/m. Physical Exam Vitals signs reviewed.  Constitutional:      Appearance: Normal appearance. He is well-developed. He is obese.  Cardiovascular:     Rate and Rhythm: Normal rate.  Pulmonary:     Effort: Pulmonary effort is normal.  Musculoskeletal: Normal range of motion.  Skin:  General: Skin is warm and dry.  Neurological:     Mental Status: He is alert and oriented to person, place, and time.  Psychiatric:        Mood and Affect: Mood normal.        Behavior: Behavior normal.     RECENT LABS AND TESTS: BMET    Component Value Date/Time   NA 142 07/19/2019 1205   K 4.3 07/19/2019 1205   CL 105 07/19/2019 1205   CO2 24 07/19/2019 1205   GLUCOSE 102 (H) 07/19/2019 1205   GLUCOSE 112 (H) 08/25/2015 1342   BUN 17 07/19/2019 1205   CREATININE 1.16 07/19/2019 1205   CALCIUM 10.0 07/19/2019 1205   GFRNONAA 63 07/19/2019 1205   GFRAA 73 07/19/2019 1205   Lab Results  Component Value Date   HGBA1C 6.4 (H) 07/19/2019   HGBA1C 6.5 (H) 03/04/2019   HGBA1C 6.1 (H) 07/10/2017   HGBA1C 6.4 (H) 04/01/2017   Lab Results  Component Value Date   INSULIN 14.8 07/19/2019   INSULIN 27.8 (H) 03/04/2019   INSULIN 14.0 07/10/2017   INSULIN 14.6 04/01/2017   CBC    Component Value Date/Time   WBC 5.4 04/01/2017 1010   WBC 6.6 08/25/2015 1342   RBC 4.82 04/01/2017 1010   RBC 4.69 08/25/2015 1342   HGB 14.5 04/01/2017 1010   HCT 42.7 04/01/2017  1010   PLT 271 08/25/2015 1342   MCV 89 04/01/2017 1010   MCH 30.1 04/01/2017 1010   MCH 29.9 08/25/2015 1342   MCHC 34.0 04/01/2017 1010   MCHC 33.2 08/25/2015 1342   RDW 14.1 04/01/2017 1010   LYMPHSABS 2.4 04/01/2017 1010   EOSABS 0.1 04/01/2017 1010   BASOSABS 0.0 04/01/2017 1010   Iron/TIBC/Ferritin/ %Sat No results found for: IRON, TIBC, FERRITIN, IRONPCTSAT Lipid Panel     Component Value Date/Time   CHOL 182 07/19/2019 1205   TRIG 103 07/19/2019 1205   HDL 54 07/19/2019 1205   LDLCALC 109 (H) 07/19/2019 1205   Hepatic Function Panel     Component Value Date/Time   PROT 6.4 07/19/2019 1205   ALBUMIN 4.3 07/19/2019 1205   AST 13 07/19/2019 1205   ALT 20 07/19/2019 1205   ALKPHOS 73 07/19/2019 1205   BILITOT <0.2 07/19/2019 1205      Component Value Date/Time   TSH 1.350 04/01/2017 1010     Ref. Range 07/19/2019 12:05  Vitamin D, 25-Hydroxy Latest Ref Range: 30.0 - 100.0 ng/mL 51.1    OBESITY BEHAVIORAL INTERVENTION VISIT  Today's visit was # 34  Starting weight: 236 lbs Starting date: 04/01/2017 Today's weight : 231 lbs Today's date: 08/09/2019 Total lbs lost to date: 5    08/09/2019  Height  (1.651 m)  Weight 231 lb (104.8 kg)  BMI (Calculated) 38.44  BLOOD PRESSURE - SYSTOLIC 124  BLOOD PRESSURE - DIASTOLIC 82   Body Fat % 35.9 %  Total Body Water (lbs) 100.8 lbs    ASK: We discussed the diagnosis of obesity with Tyler [name] today and Tyler [name] agreed to give [place] permission to discuss obesity behavioral modification therapy today.  ASSESS: Tyler [name] has the diagnosis of obesity and his BMI today is 38.44 Tyler [name] is in the action stage of change   ADVISE: Newman was educated on the multiple health risks of obesity as well as the benefit of weight loss to improve his health. He was advised of the need for long term treatment and the importance of lifestyle modifications to  improve his current health and to decrease his risk of future health  problems.  AGREE: Multiple dietary modification options and treatment options were discussed and  Tyler [name] agreed to follow the recommendations documented in the above note.  ARRANGE: Tyler [name] was educated on the importance of frequent visits to treat obesity as outlined per CMS and USPSTF guidelines and agreed to schedule his next follow up appointment today.  [place] [name], Tyler [name], am acting as transcriptionist for [employer] Aguilar, PA-C

## 2019-08-30 ENCOUNTER — Ambulatory Visit (INDEPENDENT_AMBULATORY_CARE_PROVIDER_SITE_OTHER): Payer: Medicare Other

## 2019-08-30 ENCOUNTER — Encounter (INDEPENDENT_AMBULATORY_CARE_PROVIDER_SITE_OTHER): Payer: Self-pay | Admitting: Physician Assistant

## 2019-08-30 ENCOUNTER — Other Ambulatory Visit: Payer: Self-pay

## 2019-08-30 ENCOUNTER — Ambulatory Visit (INDEPENDENT_AMBULATORY_CARE_PROVIDER_SITE_OTHER): Payer: Medicare Other | Admitting: Physician Assistant

## 2019-08-30 VITALS — BP 130/71 | HR 60 | Temp 98.4°F | Ht 65.0 in | Wt 231.0 lb

## 2019-08-30 DIAGNOSIS — Z6838 Body mass index (BMI) 38.0-38.9, adult: Secondary | ICD-10-CM | POA: Diagnosis not present

## 2019-08-30 DIAGNOSIS — J309 Allergic rhinitis, unspecified: Secondary | ICD-10-CM

## 2019-08-30 DIAGNOSIS — E7849 Other hyperlipidemia: Secondary | ICD-10-CM | POA: Diagnosis not present

## 2019-08-30 NOTE — Progress Notes (Signed)
Office: [phone]  /  Fax: [phone]   HPI:   Chief Complaint: OBESITY Tyler [name] is here to discuss his progress with his obesity treatment plan. He is on the  follow the Category 3 plan and is following his eating plan approximately 95 % of the time. He states he is exercising by doing yard work 4 times per week. Tyler [name] reports that he continues to skip lunch at times because he is eating a late breakfast. He is working in the yard and playing golf more often.  His weight is 231 lb (104.8 kg) today and has not lost weight since his last visit. He has lost 5 lbs since starting treatment with [place].  Hyperlipidemia Tyler [name] has hyperlipidemia and has been trying to improve his cholesterol levels with intensive lifestyle modification including a low saturated fat diet, exercise and weight loss. He denies any chest pain, claudication or myalgias. She is currently on Crestor.   ASSESSMENT AND PLAN:  Other hyperlipidemia  Class 2 severe obesity with serious comorbidity and body mass index (BMI) of 38.0 to 38.9 in adult, unspecified obesity type ([place])  PLAN:  Hyperlipidemia Tyler [name] was informed of the American Heart Association Guidelines emphasizing intensive lifestyle modifications as the first line treatment for hyperlipidemia. We discussed many lifestyle modifications today in depth, and Tyler [name] will continue to work on decreasing saturated fats such as fatty red meat, butter and many fried foods. He will also increase vegetables and lean protein in his diet and continue to work on exercise and weight loss efforts.  Obesity Tyler [name] is currently in the action stage of change. As such, his goal is to continue with weight loss efforts He has agreed to follow the Category 3 plan Tyler [name] has been instructed to work up to a goal of 150 minutes of combined cardio and strengthening exercise per week for weight loss and overall health benefits. We discussed the following Behavioral Modification Strategies  today: keeping healthy foods in the home, work on meal planning and easy cooking plans and holiday eating strategies .    Tyler [name] has agreed to follow up with our clinic in 3 weeks. He was informed of the importance of frequent follow up visits to maximize his success with intensive lifestyle modifications for his multiple health conditions.  ALLERGIES: No Known Allergies  MEDICATIONS: Current Outpatient Medications on File Prior to Visit  Medication Sig Dispense Refill  . acetaminophen (TYLENOL) 500 MG tablet Take 500 mg by mouth every 6 (six) hours as needed for mild pain.    Marland Kitchen amLODipine (NORVASC) 10 MG tablet TAKE 1 TABLET BY MOUTH EVERY DAY 90 tablet 1  . amLODipine (NORVASC) 5 MG tablet Take 5 mg by mouth daily.    . Cholecalciferol (VITAMIN D3) 2000 UNITS TABS Take 2,000 Units by mouth daily.    . CHROMIUM PO Take 1 tablet by mouth every morning.    Marland Kitchen dextromethorphan-guaiFENesin (MUCINEX DM) 30-600 MG 12hr tablet Take 1 tablet by mouth 2 (two) times daily as needed for cough.    . docusate sodium (COLACE) 100 MG capsule Take 200 mg by mouth at bedtime as needed for mild constipation.    Marland Kitchen lisinopril-hydrochlorothiazide (PRINZIDE,ZESTORETIC) 20-25 MG tablet Take 1 tablet by mouth daily. 90 tablet 3  . Melatonin 10 MG CAPS Take 1 capsule as needed by mouth. 30 capsule 0  . metFORMIN (GLUCOPHAGE) 500 MG tablet Take 1 tablet (500 mg total) by mouth 2 (two) times daily with a meal. 60 tablet 0  . multivitamin-iron-minerals-folic  acid (CENTRUM) chewable tablet Chew 1 tablet by mouth daily.    . Potassium 99 MG TABS Take 99 mg by mouth daily.    . QNASL 80 MCG/ACT AERS ONE SPRAY EACH NOSTRIL TWICE DAILY FOR STUFFY NOSE OR DRAINAGE. 8.7 Inhaler 0  . rosuvastatin (CRESTOR) 40 MG tablet Take 0.5 tablets (20 mg total) by mouth daily. 30 tablet 0  . triamterene-hydrochlorothiazide (MAXZIDE-25) 37.5-25 MG per tablet Take 0.5 tablets by mouth daily.      No current facility-administered  medications on file prior to visit.     PAST MEDICAL HISTORY: Past Medical History:  Diagnosis Date  . Back pain   . Heartburn   . Hyperlipidemia   . Hypertension   . Insulin resistance   . Obesity     PAST SURGICAL HISTORY: Past Surgical History:  Procedure Laterality Date  . BACK SURGERY     03/14/2004  . EYE SURGERY     bil cataract  02/2013  . LUMBAR LAMINECTOMY/DECOMPRESSION MICRODISCECTOMY N/A 05/19/2015   Procedure: Lumbar four- five diskectomy;  Surgeon: Coletta MemosKyle Cabbell, MD;  Location: [facility];  Service: Neurosurgery;  Laterality: N/A;  L45 diskectomy    SOCIAL HISTORY: Social History   Tobacco Use  . Smoking status: Former Games developermoker  . Smokeless tobacco: Never Used  . Tobacco comment: quit smokling in '70s  Substance Use Topics  . Alcohol use: Yes    Alcohol/week: 4.0 standard drinks    Types: 4 Shots of liquor per week    Comment: social   . Drug use: No    FAMILY HISTORY: Family History  Problem Relation Age of Onset  . Hypertension Father   . Heart failure Mother   . Hypertension Sister   . Heart attack Brother     ROS: Review of Systems  Constitutional: Negative for weight loss.  Cardiovascular: Negative for chest pain and claudication.  Musculoskeletal: Negative for myalgias.    PHYSICAL EXAM: Blood pressure 130/71, pulse 60, temperature 98.4 F (36.9 C), temperature source Oral, height 5\' 5"  (1.651 m), weight 231 lb (104.8 kg), SpO2 99 %. Body mass index is 38.44 kg/m. Physical Exam Vitals signs reviewed.  Constitutional:      Appearance: Normal appearance. He is obese.  HENT:     Head: Normocephalic.     Nose: Nose normal.  Neck:     Musculoskeletal: Normal range of motion.  Cardiovascular:     Rate and Rhythm: Normal rate.  Pulmonary:     Effort: Pulmonary effort is normal.  Musculoskeletal: Normal range of motion.  Skin:    General: Skin is warm and dry.  Neurological:     Mental Status: He is alert and oriented to person,  place, and time.  Psychiatric:        Mood and Affect: Mood normal.        Behavior: Behavior normal.     RECENT LABS AND TESTS: BMET    Component Value Date/Time   NA 142 07/19/2019 1205   K 4.3 07/19/2019 1205   CL 105 07/19/2019 1205   CO2 24 07/19/2019 1205   GLUCOSE 102 (H) 07/19/2019 1205   GLUCOSE 112 (H) 08/25/2015 1342   BUN 17 07/19/2019 1205   CREATININE 1.16 07/19/2019 1205   CALCIUM 10.0 07/19/2019 1205   GFRNONAA 63 07/19/2019 1205   GFRAA 73 07/19/2019 1205   Lab Results  Component Value Date   HGBA1C 6.4 (H) 07/19/2019   HGBA1C 6.5 (H) 03/04/2019   HGBA1C 6.1 (H)  07/10/2017   HGBA1C 6.4 (H) 04/01/2017   Lab Results  Component Value Date   INSULIN 14.8 07/19/2019   INSULIN 27.8 (H) 03/04/2019   INSULIN 14.0 07/10/2017   INSULIN 14.6 04/01/2017   CBC    Component Value Date/Time   WBC 5.4 04/01/2017 1010   WBC 6.6 08/25/2015 1342   RBC 4.82 04/01/2017 1010   RBC 4.69 08/25/2015 1342   HGB 14.5 04/01/2017 1010   HCT 42.7 04/01/2017 1010   PLT 271 08/25/2015 1342   MCV 89 04/01/2017 1010   MCH 30.1 04/01/2017 1010   MCH 29.9 08/25/2015 1342   MCHC 34.0 04/01/2017 1010   MCHC 33.2 08/25/2015 1342   RDW 14.1 04/01/2017 1010   LYMPHSABS 2.4 04/01/2017 1010   EOSABS 0.1 04/01/2017 1010   BASOSABS 0.0 04/01/2017 1010   Iron/TIBC/Ferritin/ %Sat No results found for: IRON, TIBC, FERRITIN, IRONPCTSAT Lipid Panel     Component Value Date/Time   CHOL 182 07/19/2019 1205   TRIG 103 07/19/2019 1205   HDL 54 07/19/2019 1205   LDLCALC 109 (H) 07/19/2019 1205   Hepatic Function Panel     Component Value Date/Time   PROT 6.4 07/19/2019 1205   ALBUMIN 4.3 07/19/2019 1205   AST 13 07/19/2019 1205   ALT 20 07/19/2019 1205   ALKPHOS 73 07/19/2019 1205   BILITOT <0.2 07/19/2019 1205      Component Value Date/Time   TSH 1.350 04/01/2017 1010      OBESITY BEHAVIORAL INTERVENTION VISIT  Today's visit was # 35  Starting weight: 236 lbs  Starting date: 04/01/17 Today's weight : Weight: 231 lb (104.8 kg)  Today's date: 08/30/2019 Total lbs lost to date: 5 lbs At least 15 minutes were spent on discussing the following behavioral intervention visit.   ASK: We discussed the diagnosis of obesity with Tyler [name] today and Tyler [name] agreed to give [place] permission to discuss obesity behavioral modification therapy today.  ASSESS: Keyandre has the diagnosis of obesity and his BMI today is 38.44 Tyler [name] is in the action stage of change   ADVISE: Tyler [name] was educated on the multiple health risks of obesity as well as the benefit of weight loss to improve his health. He was advised of the need for long term treatment and the importance of lifestyle modifications to improve his current health and to decrease his risk of future health problems.  AGREE: Multiple dietary modification options and treatment options were discussed and  Tyler [name] agreed to follow the recommendations documented in the above note.  ARRANGE: Tyler [name] was educated on the importance of frequent visits to treat obesity as outlined per CMS and USPSTF guidelines and agreed to schedule his next follow up appointment today.  Tyler [name], am acting as transcriptionist for Alois Cliche, PA-C I, Alois Cliche, PA-C have reviewed above note and agree with its content

## 2019-09-21 ENCOUNTER — Ambulatory Visit (INDEPENDENT_AMBULATORY_CARE_PROVIDER_SITE_OTHER): Payer: Medicare Other | Admitting: Physician Assistant

## 2019-09-22 ENCOUNTER — Ambulatory Visit (INDEPENDENT_AMBULATORY_CARE_PROVIDER_SITE_OTHER): Payer: Medicare Other

## 2019-09-22 DIAGNOSIS — J309 Allergic rhinitis, unspecified: Secondary | ICD-10-CM

## 2019-09-24 ENCOUNTER — Other Ambulatory Visit: Payer: Self-pay | Admitting: Cardiology

## 2019-10-13 ENCOUNTER — Ambulatory Visit (INDEPENDENT_AMBULATORY_CARE_PROVIDER_SITE_OTHER): Payer: Medicare Other | Admitting: *Deleted

## 2019-10-13 DIAGNOSIS — J309 Allergic rhinitis, unspecified: Secondary | ICD-10-CM | POA: Diagnosis not present

## 2019-10-24 ENCOUNTER — Other Ambulatory Visit (INDEPENDENT_AMBULATORY_CARE_PROVIDER_SITE_OTHER): Payer: Self-pay | Admitting: Physician Assistant

## 2019-10-24 DIAGNOSIS — R7303 Prediabetes: Secondary | ICD-10-CM

## 2019-11-02 ENCOUNTER — Ambulatory Visit (INDEPENDENT_AMBULATORY_CARE_PROVIDER_SITE_OTHER): Payer: Medicare Other

## 2019-11-02 DIAGNOSIS — J309 Allergic rhinitis, unspecified: Secondary | ICD-10-CM

## 2019-11-10 ENCOUNTER — Other Ambulatory Visit: Payer: Medicare Other

## 2019-11-12 ENCOUNTER — Ambulatory Visit: Payer: Medicare Other | Attending: Internal Medicine

## 2019-11-12 DIAGNOSIS — Z23 Encounter for immunization: Secondary | ICD-10-CM | POA: Insufficient documentation

## 2019-11-12 NOTE — Progress Notes (Signed)
   Covid-19 Vaccination Clinic  Name:  Tyler [name]    MRN: [id number] DOB: April 12, 1946  11/12/2019  Tyler [name] was observed post Covid-19 immunization for 15 minutes without incidence. He was provided with Vaccine Information Sheet and instruction to access the V-Safe system.   Tyler [name] was instructed to call 911 with any severe reactions post vaccine: Marland Kitchen Difficulty breathing  . Swelling of your face and throat  . A fast heartbeat  . A bad rash all over your body  . Dizziness and weakness    Immunizations Administered    Name Date Dose VIS Date Route   Pfizer COVID-19 Vaccine 11/12/2019  4:35 PM 0.3 mL 10/01/2019 Intramuscular   Manufacturer: [employer], [employer]   Lot: [id number]   NDC: [address]-2

## 2019-11-14 ENCOUNTER — Other Ambulatory Visit (INDEPENDENT_AMBULATORY_CARE_PROVIDER_SITE_OTHER): Payer: Self-pay | Admitting: Physician Assistant

## 2019-11-14 DIAGNOSIS — R7303 Prediabetes: Secondary | ICD-10-CM

## 2019-11-15 DIAGNOSIS — J301 Allergic rhinitis due to pollen: Secondary | ICD-10-CM

## 2019-11-15 NOTE — Progress Notes (Signed)
VIALS EXP 11-14-20 

## 2019-11-16 DIAGNOSIS — J3081 Allergic rhinitis due to animal (cat) (dog) hair and dander: Secondary | ICD-10-CM | POA: Diagnosis not present

## 2019-11-17 DIAGNOSIS — J3089 Other allergic rhinitis: Secondary | ICD-10-CM

## 2019-11-22 ENCOUNTER — Ambulatory Visit: Payer: Medicare Other | Admitting: Allergy and Immunology

## 2019-11-25 NOTE — Progress Notes (Signed)
[place]  [id number] Dept: [phone]  FOLLOW UP NOTE  Patient ID: Tyler [name], male    DOB: [birth year]  Age: 74 y.o. MRN: [id number] Date of Office Visit: 11/26/2019  Assessment  Chief Complaint: Allergic Rhinitis , Cough, and Nasal Congestion  HPI Tyler [name] is a 74 year old male who presents to the clinic for a follow up visit. He was last seen in this clinic on 03/15/2015 for evaluation of allergic rhinitis on allergen immunotherapy and wheeze.  At today's visit, he reports his allergic rhinitis has been moderately well controlled with symptoms including nasal congestion and clear rhinorrhea for which he continues Flonase daily, nasal rinse as needed, and Mucinex as needed.  He continues to receive allergen immunotherapy with no adverse reactions.  He reports a significant decrease in his symptoms of allergic rhinitis while continuing on allergen immunotherapy.  He reports that his heart rate is frequently in the 50s and has been told that his heart has an irregular beat for which he is followed by Dr. Adrian Prows, Cardiology specialist.  His current medications are listed in the chart.   Drug Allergies:  No Known Allergies  Physical Exam: BP 132/60   Pulse (!) 54   Temp (!) 97.3 F (36.3 C) (Temporal)   Resp 18   Ht 5\' 6"  (1.676 m)   Wt 237 lb 3.2 oz (107.6 kg)   SpO2 98%   BMI 38.29 kg/m    Physical Exam Vitals reviewed.  Constitutional:      Appearance: Normal appearance.  HENT:     Head: Normocephalic and atraumatic.     Right Ear: Tympanic membrane normal.     Left Ear: Tympanic membrane normal.     Nose:     Comments: Bilateral nares erythematous with clear nasal drainage noted.  Pharynx normal.  Ears normal.  Eyes normal.    Mouth/Throat:     Pharynx: Oropharynx is clear.  Eyes:     Conjunctiva/sclera: Conjunctivae normal.  Cardiovascular:     Rate and Rhythm: Bradycardia present. Rhythm irregular.  Pulmonary:   Effort: Pulmonary effort is normal.     Breath sounds: Normal breath sounds.     Comments: Lungs clear to auscultation Musculoskeletal:        General: Normal range of motion.     Cervical back: Normal range of motion and neck supple.  Skin:    General: Skin is warm and dry.  Neurological:     Mental Status: He is alert and oriented to person, place, and time.  Psychiatric:        Mood and Affect: Mood normal.        Behavior: Behavior normal.        Thought Content: Thought content normal.        Judgment: Judgment normal.    Assessment and Plan: 1. Allergic rhinitis, unspecified seasonality, unspecified trigger     Meds ordered this encounter  Medications  . EPINEPHrine (EPIPEN 2-PAK) 0.3 mg/0.3 mL IJ SOAJ injection    Sig: Inject 0.3 mLs (0.3 mg total) into the muscle once for 1 dose.    Dispense:  2 each    Refill:  2    Patient Instructions  Allergic rhinitis Continue Flonase 2 sprays in each nostril once a day as needed for a stuffy nose.  In the right nostril, point the applicator out toward the right ear. In the left nostril, point the applicator out toward the left ear Consider  saline nasal rinses as needed for nasal symptoms. Use this before any medicated nasal sprays for best result Continue allergen immunotherapy once every 3 weeks and have access to an epinephrine auto-injector set For thick post nasal drainage, continue Mucinex [phone] mg twice a day as needed and increase hydration as tolerated.  Your heart rhythm was irregular today. Follow up with your primary care provider or your cardiologist for further evaluation of this.   Call the clinic if this treatment plan is not working well for you  Follow up in 1 year or sooner if needed.    Return in about 1 year (around 11/25/2020), or if symptoms worsen or fail to improve.    Thank you for the opportunity to care for this patient.  Please do not hesitate to contact me with questions.  Thermon Leyland, FNP  [facility]

## 2019-11-26 ENCOUNTER — Encounter: Payer: Self-pay | Admitting: Family Medicine

## 2019-11-26 ENCOUNTER — Ambulatory Visit: Payer: Self-pay | Admitting: *Deleted

## 2019-11-26 ENCOUNTER — Other Ambulatory Visit: Payer: Self-pay

## 2019-11-26 ENCOUNTER — Ambulatory Visit (INDEPENDENT_AMBULATORY_CARE_PROVIDER_SITE_OTHER): Payer: Medicare Other | Admitting: Family Medicine

## 2019-11-26 VITALS — BP 132/60 | HR 54 | Temp 97.3°F | Resp 18 | Ht 66.0 in | Wt 237.2 lb

## 2019-11-26 DIAGNOSIS — J309 Allergic rhinitis, unspecified: Secondary | ICD-10-CM

## 2019-11-26 MED ORDER — EPINEPHRINE 0.3 MG/0.3ML IJ SOAJ: 0.3000 mg | Freq: Once | INTRAMUSCULAR | 2 refills | Status: AC

## 2019-11-26 NOTE — Patient Instructions (Addendum)
Allergic rhinitis Continue Flonase 2 sprays in each nostril once a day as needed for a stuffy nose.  In the right nostril, point the applicator out toward the right ear. In the left nostril, point the applicator out toward the left ear Consider saline nasal rinses as needed for nasal symptoms. Use this before any medicated nasal sprays for best result Continue allergen immunotherapy once every 3 weeks and have access to an epinephrine auto-injector set For thick post nasal drainage, continue Mucinex [phone] mg twice a day as needed and increase hydration as tolerated.  Your heart rhythm was irregular today. Follow up with your primary care provider or your cardiologist for further evaluation of this.   Call the clinic if this treatment plan is not working well for you  Follow up in 1 year or sooner if needed.

## 2019-12-03 ENCOUNTER — Ambulatory Visit: Payer: Medicare Other | Attending: Internal Medicine

## 2019-12-03 DIAGNOSIS — Z23 Encounter for immunization: Secondary | ICD-10-CM | POA: Insufficient documentation

## 2019-12-03 NOTE — Progress Notes (Signed)
   Covid-19 Vaccination Clinic  Name:  Tyler [name]    MRN: [id number] DOB: 03/01/1946  12/03/2019  [name] was observed post Covid-19 immunization for 15 minutes without incidence. He was provided with Vaccine Information Sheet and instruction to access the V-Safe system.   [name] was instructed to call 911 with any severe reactions post vaccine: Marland Kitchen Difficulty breathing  . Swelling of your face and throat  . A fast heartbeat  . A bad rash all over your body  . Dizziness and weakness    Immunizations Administered    Name Date Dose VIS Date Route   Pfizer COVID-19 Vaccine 12/03/2019  3:46 PM 0.3 mL 10/01/2019 Intramuscular   Manufacturer: [employer], [employer]   Lot: [id number]   NDC: [address]-1

## 2019-12-22 ENCOUNTER — Ambulatory Visit (INDEPENDENT_AMBULATORY_CARE_PROVIDER_SITE_OTHER): Payer: Medicare Other

## 2019-12-22 DIAGNOSIS — J309 Allergic rhinitis, unspecified: Secondary | ICD-10-CM | POA: Diagnosis not present

## 2019-12-30 ENCOUNTER — Other Ambulatory Visit (INDEPENDENT_AMBULATORY_CARE_PROVIDER_SITE_OTHER): Payer: Self-pay | Admitting: Physician Assistant

## 2019-12-30 DIAGNOSIS — R7303 Prediabetes: Secondary | ICD-10-CM

## 2020-01-12 ENCOUNTER — Ambulatory Visit (INDEPENDENT_AMBULATORY_CARE_PROVIDER_SITE_OTHER): Payer: Medicare Other

## 2020-01-12 DIAGNOSIS — J309 Allergic rhinitis, unspecified: Secondary | ICD-10-CM

## 2020-01-28 ENCOUNTER — Ambulatory Visit (INDEPENDENT_AMBULATORY_CARE_PROVIDER_SITE_OTHER): Payer: Medicare Other

## 2020-01-28 DIAGNOSIS — J309 Allergic rhinitis, unspecified: Secondary | ICD-10-CM | POA: Diagnosis not present

## 2020-02-02 ENCOUNTER — Other Ambulatory Visit: Payer: Self-pay | Admitting: Cardiology

## 2020-02-02 DIAGNOSIS — I10 Essential (primary) hypertension: Secondary | ICD-10-CM

## 2020-02-09 ENCOUNTER — Ambulatory Visit (INDEPENDENT_AMBULATORY_CARE_PROVIDER_SITE_OTHER): Payer: Medicare Other

## 2020-02-09 DIAGNOSIS — J309 Allergic rhinitis, unspecified: Secondary | ICD-10-CM | POA: Diagnosis not present

## 2020-02-25 ENCOUNTER — Other Ambulatory Visit: Payer: Self-pay | Admitting: Cardiology

## 2020-02-25 DIAGNOSIS — I10 Essential (primary) hypertension: Secondary | ICD-10-CM

## 2020-02-28 ENCOUNTER — Ambulatory Visit (INDEPENDENT_AMBULATORY_CARE_PROVIDER_SITE_OTHER): Payer: Medicare Other

## 2020-02-28 DIAGNOSIS — J309 Allergic rhinitis, unspecified: Secondary | ICD-10-CM

## 2020-03-09 ENCOUNTER — Ambulatory Visit (INDEPENDENT_AMBULATORY_CARE_PROVIDER_SITE_OTHER): Payer: Medicare Other

## 2020-03-09 DIAGNOSIS — J309 Allergic rhinitis, unspecified: Secondary | ICD-10-CM

## 2020-03-15 ENCOUNTER — Other Ambulatory Visit: Payer: Self-pay | Admitting: Cardiology

## 2020-03-15 DIAGNOSIS — E7800 Pure hypercholesterolemia, unspecified: Secondary | ICD-10-CM

## 2020-03-22 ENCOUNTER — Other Ambulatory Visit: Payer: Self-pay | Admitting: Cardiology

## 2020-03-22 DIAGNOSIS — I10 Essential (primary) hypertension: Secondary | ICD-10-CM

## 2020-03-24 ENCOUNTER — Ambulatory Visit (INDEPENDENT_AMBULATORY_CARE_PROVIDER_SITE_OTHER): Payer: Medicare Other

## 2020-03-24 DIAGNOSIS — J309 Allergic rhinitis, unspecified: Secondary | ICD-10-CM | POA: Diagnosis not present

## 2020-04-12 ENCOUNTER — Ambulatory Visit (INDEPENDENT_AMBULATORY_CARE_PROVIDER_SITE_OTHER): Payer: Medicare Other

## 2020-04-12 DIAGNOSIS — J309 Allergic rhinitis, unspecified: Secondary | ICD-10-CM | POA: Diagnosis not present

## 2020-05-03 ENCOUNTER — Ambulatory Visit (INDEPENDENT_AMBULATORY_CARE_PROVIDER_SITE_OTHER): Payer: Medicare Other

## 2020-05-03 DIAGNOSIS — J309 Allergic rhinitis, unspecified: Secondary | ICD-10-CM

## 2020-05-25 ENCOUNTER — Ambulatory Visit (INDEPENDENT_AMBULATORY_CARE_PROVIDER_SITE_OTHER): Payer: Medicare Other

## 2020-05-25 DIAGNOSIS — J309 Allergic rhinitis, unspecified: Secondary | ICD-10-CM | POA: Diagnosis not present

## 2020-06-19 ENCOUNTER — Ambulatory Visit (INDEPENDENT_AMBULATORY_CARE_PROVIDER_SITE_OTHER): Payer: Medicare Other

## 2020-06-19 DIAGNOSIS — J309 Allergic rhinitis, unspecified: Secondary | ICD-10-CM

## 2020-06-28 DIAGNOSIS — J301 Allergic rhinitis due to pollen: Secondary | ICD-10-CM

## 2020-06-28 NOTE — Progress Notes (Signed)
Vials exp 06-28-21 

## 2020-06-29 DIAGNOSIS — J3089 Other allergic rhinitis: Secondary | ICD-10-CM

## 2020-07-03 DIAGNOSIS — J302 Other seasonal allergic rhinitis: Secondary | ICD-10-CM

## 2020-07-18 ENCOUNTER — Ambulatory Visit (INDEPENDENT_AMBULATORY_CARE_PROVIDER_SITE_OTHER): Payer: Medicare Other | Admitting: *Deleted

## 2020-07-18 DIAGNOSIS — J309 Allergic rhinitis, unspecified: Secondary | ICD-10-CM

## 2020-09-06 ENCOUNTER — Ambulatory Visit (INDEPENDENT_AMBULATORY_CARE_PROVIDER_SITE_OTHER): Payer: Medicare Other

## 2020-09-06 DIAGNOSIS — J309 Allergic rhinitis, unspecified: Secondary | ICD-10-CM

## 2020-09-19 ENCOUNTER — Ambulatory Visit (INDEPENDENT_AMBULATORY_CARE_PROVIDER_SITE_OTHER): Payer: Medicare Other

## 2020-09-19 DIAGNOSIS — J309 Allergic rhinitis, unspecified: Secondary | ICD-10-CM | POA: Diagnosis not present

## 2020-10-05 ENCOUNTER — Ambulatory Visit (INDEPENDENT_AMBULATORY_CARE_PROVIDER_SITE_OTHER): Payer: Medicare Other

## 2020-10-05 DIAGNOSIS — J309 Allergic rhinitis, unspecified: Secondary | ICD-10-CM

## 2020-10-25 ENCOUNTER — Ambulatory Visit (INDEPENDENT_AMBULATORY_CARE_PROVIDER_SITE_OTHER): Payer: Medicare Other

## 2020-10-25 DIAGNOSIS — J309 Allergic rhinitis, unspecified: Secondary | ICD-10-CM

## 2020-11-03 ENCOUNTER — Ambulatory Visit (INDEPENDENT_AMBULATORY_CARE_PROVIDER_SITE_OTHER): Payer: Medicare Other | Admitting: *Deleted

## 2020-11-03 DIAGNOSIS — J309 Allergic rhinitis, unspecified: Secondary | ICD-10-CM | POA: Diagnosis not present

## 2020-11-10 ENCOUNTER — Ambulatory Visit (INDEPENDENT_AMBULATORY_CARE_PROVIDER_SITE_OTHER): Payer: Medicare Other | Admitting: *Deleted

## 2020-11-10 DIAGNOSIS — J309 Allergic rhinitis, unspecified: Secondary | ICD-10-CM | POA: Diagnosis not present

## 2020-11-15 ENCOUNTER — Ambulatory Visit (INDEPENDENT_AMBULATORY_CARE_PROVIDER_SITE_OTHER): Payer: Medicare Other

## 2020-11-15 DIAGNOSIS — J309 Allergic rhinitis, unspecified: Secondary | ICD-10-CM | POA: Diagnosis not present

## 2020-11-28 ENCOUNTER — Ambulatory Visit (INDEPENDENT_AMBULATORY_CARE_PROVIDER_SITE_OTHER): Payer: Medicare Other | Admitting: *Deleted

## 2020-11-28 DIAGNOSIS — J309 Allergic rhinitis, unspecified: Secondary | ICD-10-CM | POA: Diagnosis not present

## 2020-12-04 ENCOUNTER — Ambulatory Visit
Admission: RE | Admit: 2020-12-04 | Discharge: 2020-12-04 | Disposition: A | Discharge status: Home / Self Care | Payer: Medicare Other | Source: Ambulatory Visit | Attending: Internal Medicine | Admitting: Internal Medicine

## 2020-12-04 ENCOUNTER — Other Ambulatory Visit: Payer: Self-pay | Admitting: Internal Medicine

## 2020-12-04 DIAGNOSIS — N200 Calculus of kidney: Secondary | ICD-10-CM

## 2020-12-28 ENCOUNTER — Ambulatory Visit (INDEPENDENT_AMBULATORY_CARE_PROVIDER_SITE_OTHER): Payer: Medicare Other

## 2020-12-28 DIAGNOSIS — J309 Allergic rhinitis, unspecified: Secondary | ICD-10-CM | POA: Diagnosis not present

## 2021-01-23 ENCOUNTER — Ambulatory Visit (INDEPENDENT_AMBULATORY_CARE_PROVIDER_SITE_OTHER): Payer: Medicare Other | Admitting: *Deleted

## 2021-01-23 DIAGNOSIS — J309 Allergic rhinitis, unspecified: Secondary | ICD-10-CM

## 2021-03-09 ENCOUNTER — Ambulatory Visit (INDEPENDENT_AMBULATORY_CARE_PROVIDER_SITE_OTHER): Payer: Medicare Other | Admitting: *Deleted

## 2021-03-09 DIAGNOSIS — J309 Allergic rhinitis, unspecified: Secondary | ICD-10-CM

## 2021-03-14 DIAGNOSIS — J301 Allergic rhinitis due to pollen: Secondary | ICD-10-CM

## 2021-03-14 NOTE — Progress Notes (Signed)
VIALS EXP 03-14-22 

## 2021-03-15 DIAGNOSIS — J3081 Allergic rhinitis due to animal (cat) (dog) hair and dander: Secondary | ICD-10-CM | POA: Diagnosis not present

## 2021-03-16 DIAGNOSIS — J302 Other seasonal allergic rhinitis: Secondary | ICD-10-CM | POA: Diagnosis not present

## 2021-04-04 ENCOUNTER — Ambulatory Visit (INDEPENDENT_AMBULATORY_CARE_PROVIDER_SITE_OTHER): Payer: Medicare Other

## 2021-04-04 DIAGNOSIS — J309 Allergic rhinitis, unspecified: Secondary | ICD-10-CM

## 2021-05-01 ENCOUNTER — Ambulatory Visit (INDEPENDENT_AMBULATORY_CARE_PROVIDER_SITE_OTHER): Payer: Medicare Other | Admitting: *Deleted

## 2021-05-01 DIAGNOSIS — J309 Allergic rhinitis, unspecified: Secondary | ICD-10-CM

## 2021-05-03 ENCOUNTER — Ambulatory Visit: Payer: Medicare Other | Admitting: Cardiology

## 2021-05-03 ENCOUNTER — Encounter: Payer: Self-pay | Admitting: Cardiology

## 2021-05-03 ENCOUNTER — Other Ambulatory Visit: Payer: Self-pay

## 2021-05-03 VITALS — BP 133/81 | HR 61 | Temp 97.8°F | Resp 16 | Ht 66.0 in | Wt 225.8 lb

## 2021-05-03 DIAGNOSIS — I10 Essential (primary) hypertension: Secondary | ICD-10-CM

## 2021-05-03 DIAGNOSIS — E7800 Pure hypercholesterolemia, unspecified: Secondary | ICD-10-CM

## 2021-05-03 DIAGNOSIS — R7303 Prediabetes: Secondary | ICD-10-CM

## 2021-05-03 NOTE — Progress Notes (Signed)
Subjective:  Primary Physician:  Rogers Blocker, MD  Patient ID: Tyler [name], male    DOB: 10-08-1946, 75 y.o.   MRN: [id number]  Chief Complaint  Patient presents with   Hypertension   Follow-up   Coronary Artery Disease     HPI: Tyler [name]  is a 75 y.o. male  Patient is an AAM with history of hypertension, prediabetes, moderate obesity and strong family history of premature coronary artery disease and sudden cardiac death in his brother at age 76 years, evaluated by me for abnormal EKG in 2018, I had last seen him 3 years ago.  Patient is essentially asymptomatic but made an appointment as his son just a few weeks ago had sudden cardiac arrest and he is awaiting autopsy report.  He is concerned about coronary artery disease.  He is presently grieving, but no other specific symptoms of dyspnea, chest pain or palpitations.  Past Medical History:  Diagnosis Date   Back pain    Heartburn    Hyperlipidemia    Hypertension    Insulin resistance    Obesity     Past Surgical History:  Procedure Laterality Date   BACK SURGERY     03/14/2004   EYE SURGERY     bil cataract  02/2013   LUMBAR LAMINECTOMY/DECOMPRESSION MICRODISCECTOMY N/A 05/19/2015   Procedure: Lumbar four- five diskectomy;  Surgeon: Ashok Pall, MD;  Location: [facility];  Service: Neurosurgery;  Laterality: N/A;  L45 diskectomy   Social History   Tobacco Use   Smoking status: Former    Packs/day: 0.25    Years: 3.00    Pack years: 0.75    Types: Cigarettes   Smokeless tobacco: Never   Tobacco comments:    quit smokling in '70s  Substance Use Topics   Alcohol use: Yes    Alcohol/week: 4.0 standard drinks    Types: 4 Shots of liquor per week    Comment: social     No Known Allergies   Current Outpatient Medications on File Prior to Visit  Medication Sig Dispense Refill   amLODipine (NORVASC) 10 MG tablet Take 10 mg by mouth daily.     dextromethorphan-guaiFENesin (MUCINEX DM) 30-600  MG 12hr tablet Take 1 tablet by mouth 2 (two) times daily as needed for cough.     finasteride (PROSCAR) 5 MG tablet Take 5 mg by mouth daily.     fluticasone (FLONASE) 50 MCG/ACT nasal spray Place 2 sprays into both nostrils daily.     lisinopril-hydrochlorothiazide (ZESTORETIC) 20-25 MG tablet TAKE 1 TABLET BY MOUTH DAILY. PATIENT NEEDS TO SCHEDULE AN APPOINTMENT FOR REFILLS 30 tablet 0   meloxicam (MOBIC) 15 MG tablet Take 15 mg by mouth daily.     multivitamin-iron-minerals-folic acid (CENTRUM) chewable tablet Chew 1 tablet by mouth daily.     omeprazole (PRILOSEC) 40 MG capsule Take 40 mg by mouth daily.     Potassium 99 MG TABS Take 99 mg by mouth daily.     QUEtiapine (SEROQUEL) 25 MG tablet Take 25 mg by mouth at bedtime.     rosuvastatin (CRESTOR) 40 MG tablet Take 0.5 tablets (20 mg total) by mouth daily. 30 tablet 0   tamsulosin (FLOMAX) 0.4 MG CAPS capsule Take 1 capsule by mouth daily.     No current facility-administered medications on file prior to visit.   Medications after present visit: Current Outpatient Medications  Medication Instructions   amLODipine (NORVASC) 10 mg, Oral, Daily   dextromethorphan-guaiFENesin (  MUCINEX DM) 30-600 MG 12hr tablet 1 tablet, Oral, 2 times daily PRN   finasteride (PROSCAR) 5 mg, Oral, Daily   fluticasone (FLONASE) 50 MCG/ACT nasal spray 2 sprays, Each Nare, Daily   lisinopril-hydrochlorothiazide (ZESTORETIC) 20-25 MG tablet 1 tablet, Oral, Daily, Patient needs to schedule an appointment for refills   meloxicam (MOBIC) 15 mg, Oral, Daily   multivitamin-iron-minerals-folic acid (CENTRUM) chewable tablet 1 tablet, Daily   omeprazole (PRILOSEC) 40 mg, Oral, Daily   Potassium 99 mg, Oral, Daily   QUEtiapine (SEROQUEL) 25 mg, Oral, Daily at bedtime   rosuvastatin (CRESTOR) 20 mg, Oral, Daily   tamsulosin (FLOMAX) 0.4 MG CAPS capsule 1 capsule, Oral, Daily     Review of Systems  Respiratory:  Negative for shortness of breath.    Cardiovascular:  Negative for chest pain, palpitations, orthopnea, claudication and leg swelling.  Musculoskeletal:  Positive for joint pain.      Objective:  Blood pressure 133/81, pulse 61, temperature 97.8 F (36.6 C), temperature source Temporal, resp. rate 16, height 5' 6"  (1.676 m), weight 225 lb 12.8 oz (102.4 kg), SpO2 98 %. Body mass index is 36.45 kg/m. Vitals with BMI 05/03/2021 11/26/2019 08/30/2019  Height 5' 6"  5' 6"  5' 5"   Weight 225 lbs 13 oz 237 lbs 3 oz 231 lbs  BMI 36.46 45.4 09.81  Systolic 191 478 295  Diastolic 81 60 71  Pulse 61 54 60      Physical Exam Constitutional:      General: He is not in acute distress.    Appearance: He is well-developed.     Comments: Moderately obese  Neck:     Thyroid: No thyromegaly.     Vascular: No JVD.  Cardiovascular:     Rate and Rhythm: Normal rate and regular rhythm.     Pulses: Intact distal pulses.     Heart sounds: Normal heart sounds. No murmur heard.   No gallop.  Pulmonary:     Effort: Pulmonary effort is normal.     Breath sounds: Normal breath sounds.  Abdominal:     General: Bowel sounds are normal.     Palpations: Abdomen is soft.  Musculoskeletal:        General: Normal range of motion.     Cervical back: Neck supple.  Skin:    General: Skin is warm and dry.  Neurological:     General: No focal deficit present.     Mental Status: He is alert.   CARDIAC STUDIES:   Echocardiogram 06/05/2018: Left ventricle cavity is normal in size. Moderate concentric hypertrophy of the left ventricle. Normal global wall motion. Doppler evidence of grade I (impaired) diastolic dysfunction, normal LAP. Calculated EF 55%. Left atrial cavity is mildly dilated. No significant valvular abnormality. Inadequate tricuspid regurgitation jet to estimate pulmonary artery pressure. Normal right atrial pressure.  Lexiscan myoview stress test 05/22/2018:  1. Lexiscan stress test was performed. Exercise capacity was not  assessed. Stress symptoms included dizziness. Resting and peak effect blood pressure elevated, reaching 200/78 mmHg. The stress electrocardiogram showed sinus tachycardia, normal stress conduction, no stress arrhythmias and normal stress repolarization. However, stress EKG is non diagnostic for ischemia as it is a pharmacologic stress.  2. The overall quality of the study is good. Left ventricular cavity is noted to be normal on the rest and stress studies. Gated SPECT images reveal normal myocardial thickening and wall motion. The left ventricular ejection fraction was calculated to be 49%, although visually appears normal. Small area of decreased  perfusion uptake in basal inferior myocardium on both rest and stress SPECT images may represent diaphragmatic attenuation.  3. Low risk study.   EKG:   EKG 05/03/2020: Normal sinus rhythm at rate of 63 bpm, normal axis.  Incomplete right bundle branch block.  Otherwise normal EKG. no significant change from 01/04/2019.   Recent Labs:   07/19/2019: Cholesterol 182, HDL 54, Triglycerides 103, LDL 109. Creatinine 1.16, EGFR 73, potassium 4.3. CMP normal. Hemoglobin A1C 6.4%. Vitamin D normal.   03/04/2019: Cholesterol 201, HDL 48, Triglycerides 113, LDL 130. Creatinine 0.99, EGFR 89, potassium 4.0. CMP normal. Hemoglobin A1C 6.5%. Vitamin D normal.    04/21/2018: Cholesterol 217, HDL 59, triglycerides 153, LDL 131. Creatinine 1.03, EGFR 85, potassium 4.1, CMP normal. Hemoglobin A1c 6.2%. TSH normal.   Assessment & Recommendations:     ICD-10-CM   1. Essential hypertension  I10 EKG 12-Lead    2. Pure hypercholesterolemia  E78.00     3. Prediabetes  R73.03       Recommendation:   Patient is an AAM with history of hypertension, prediabetes, moderate obesity and strong family history of premature coronary artery disease and sudden cardiac death in his brother at age 50 years, evaluated by me for abnormal EKG in 2018, I had last seen him 3 years  ago.  Patient is essentially asymptomatic but made an appointment as his son just a few weeks ago had sudden cardiac arrest and he is awaiting autopsy report.  He is concerned about coronary artery disease.  Physical examination is unremarkable, he has in fact lost weight compared to 3 years ago by about 10 to 15 pounds, he is also on aggressive and optimal medical therapy including high intensity high-dose statins, ACE inhibitor and with amlodipine combination, blood pressure is also under excellent control.  EKG is unremarkable.  Primary prevention discussed in detail, advised him that he is at overall low risk for ACS in view of controlled diabetes, controlled hypertension and lipids.  Encouraged him to continue to lose weight.  Prediabetes also discussed with the patient and with weight loss this should also improve.  I offered my condolences.  I will see him back on a as needed basis.  Goal LDL between 70-100, blood pressure goal 130/80 mmHg, BMI between 25 and 28 would be most optimal and this was discussed with the patient.    Adrian Prows, MD, [facility] 05/03/2021, 5:45 PM Office: [phone] Fax: [phone] Pager: [phone]

## 2021-05-10 ENCOUNTER — Ambulatory Visit (INDEPENDENT_AMBULATORY_CARE_PROVIDER_SITE_OTHER): Payer: Medicare Other | Admitting: *Deleted

## 2021-05-10 DIAGNOSIS — J309 Allergic rhinitis, unspecified: Secondary | ICD-10-CM

## 2021-05-21 ENCOUNTER — Ambulatory Visit (INDEPENDENT_AMBULATORY_CARE_PROVIDER_SITE_OTHER): Payer: Medicare Other

## 2021-05-21 DIAGNOSIS — J309 Allergic rhinitis, unspecified: Secondary | ICD-10-CM

## 2021-05-31 ENCOUNTER — Other Ambulatory Visit: Payer: Self-pay | Admitting: Urology

## 2021-06-05 ENCOUNTER — Other Ambulatory Visit: Payer: Self-pay

## 2021-06-05 ENCOUNTER — Encounter (HOSPITAL_BASED_OUTPATIENT_CLINIC_OR_DEPARTMENT_OTHER): Payer: Self-pay | Admitting: Urology

## 2021-06-05 NOTE — Progress Notes (Signed)
Spoke w/ via phone for pre-op interview--- Pt Lab needs dos----   Istat            Lab results------current ekg in epic/ chart COVID test -----patient states asymptomatic no test needed Arrive at ------- 0900 on 06-08-2021 NPO after MN NO Solid Food.  Clear liquids from MN until--- 0800 Med rec completed Medications to take morning of surgery ----- Norvasc, Prilosec, Proscar, Flomax Diabetic medication ----- n/a Patient instructed no nail polish to be worn day of surgery Patient instructed to bring photo id and insurance card day of surgery Patient aware to have Driver (ride ) / caregiver  for 24 hours after surgery --wife, [name] Patient Special Instructions ----- n/a Pre-Op special Istructions ----- n/a Patient verbalized understanding of instructions that were given at this phone interview. Patient denies shortness of breath, chest pain, fever, cough at this phone interview.    PCP:  Dr Bary Richard Cardiologist : Dr Jacinto Halim (lov 05-03-2021 epic) Chest x-ray : 11-18-2018 epic EKG : 05-03-2021 epic Echo : 06-05-2018  results in dr Jacinto Halim office note Stress test:  05-22-2018 results in dr Jacinto Halim office note Cardiac Cath : no Activity level: denies sob w/ any activity Sleep Study/ CPAP : no Blood Thinner/ Instructions /Last Dose: no ASA / Instructions/ Last Dose :  no

## 2021-06-08 ENCOUNTER — Ambulatory Visit (HOSPITAL_BASED_OUTPATIENT_CLINIC_OR_DEPARTMENT_OTHER)
Admission: RE | Admit: 2021-06-08 | Discharge: 2021-06-08 | Disposition: A | Discharge status: Home / Self Care | Payer: Medicare Other | Attending: Urology | Admitting: Urology

## 2021-06-08 ENCOUNTER — Other Ambulatory Visit: Payer: Self-pay

## 2021-06-08 ENCOUNTER — Encounter (HOSPITAL_BASED_OUTPATIENT_CLINIC_OR_DEPARTMENT_OTHER)
Admission: RE | Disposition: A | Discharge status: Home / Self Care | Payer: Self-pay | Source: Home / Self Care | Attending: Urology

## 2021-06-08 ENCOUNTER — Encounter (HOSPITAL_BASED_OUTPATIENT_CLINIC_OR_DEPARTMENT_OTHER): Payer: Self-pay | Admitting: Urology

## 2021-06-08 ENCOUNTER — Ambulatory Visit (HOSPITAL_BASED_OUTPATIENT_CLINIC_OR_DEPARTMENT_OTHER): Payer: Medicare Other | Admitting: Anesthesiology

## 2021-06-08 DIAGNOSIS — N401 Enlarged prostate with lower urinary tract symptoms: Secondary | ICD-10-CM | POA: Diagnosis not present

## 2021-06-08 DIAGNOSIS — R351 Nocturia: Secondary | ICD-10-CM | POA: Insufficient documentation

## 2021-06-08 DIAGNOSIS — Z79899 Other long term (current) drug therapy: Secondary | ICD-10-CM | POA: Insufficient documentation

## 2021-06-08 DIAGNOSIS — N320 Bladder-neck obstruction: Secondary | ICD-10-CM | POA: Diagnosis not present

## 2021-06-08 DIAGNOSIS — R338 Other retention of urine: Secondary | ICD-10-CM | POA: Insufficient documentation

## 2021-06-08 DIAGNOSIS — N210 Calculus in bladder: Secondary | ICD-10-CM | POA: Insufficient documentation

## 2021-06-08 HISTORY — DX: Family history of ischemic heart disease and other diseases of the circulatory system: Z82.49

## 2021-06-08 HISTORY — DX: Presence of dental prosthetic device (complete) (partial): Z97.2

## 2021-06-08 HISTORY — DX: Calculus in bladder: N21.0

## 2021-06-08 HISTORY — DX: Benign prostatic hyperplasia without lower urinary tract symptoms: N40.0

## 2021-06-08 HISTORY — PX: CYSTOSCOPY WITH LITHOLAPAXY: SHX1425

## 2021-06-08 HISTORY — DX: Gastro-esophageal reflux disease without esophagitis: K21.9

## 2021-06-08 HISTORY — DX: Prediabetes: R73.03

## 2021-06-08 LAB — POCT I-STAT, CHEM 8
BUN: 18 mg/dL (ref 8–23)
Calcium, Ion: 1.41 mmol/L — ABNORMAL HIGH (ref 1.15–1.40)
Chloride: 104 mmol/L (ref 98–111)
Creatinine, Ser: 1 mg/dL (ref 0.61–1.24)
Glucose, Bld: 127 mg/dL — ABNORMAL HIGH (ref 70–99)
HCT: 46 % (ref 39.0–52.0)
Hemoglobin: 15.6 g/dL (ref 13.0–17.0)
Potassium: 3.6 mmol/L (ref 3.5–5.1)
Sodium: 142 mmol/L (ref 135–145)
TCO2: 24 mmol/L (ref 22–32)

## 2021-06-08 SURGERY — CYSTOSCOPY, WITH BLADDER CALCULUS LITHOLAPAXY
Anesthesia: General | Site: Bladder

## 2021-06-08 MED ORDER — PROPOFOL 10 MG/ML IV BOLUS
INTRAVENOUS | Status: DC | PRN
  Administered 2021-06-08: 150 mg via INTRAVENOUS

## 2021-06-08 MED ORDER — MIDAZOLAM HCL 5 MG/5ML IJ SOLN
INTRAMUSCULAR | Status: DC | PRN
Start: 2021-06-08 — End: 2021-06-08
  Administered 2021-06-08: 2 mg via INTRAVENOUS

## 2021-06-08 MED ORDER — FENTANYL CITRATE (PF) 100 MCG/2ML IJ SOLN
INTRAMUSCULAR | Status: DC | PRN
  Administered 2021-06-08 (×2): 50 ug via INTRAVENOUS

## 2021-06-08 MED ORDER — ACETAMINOPHEN 325 MG PO TABS: 650.0000 mg | ORAL_TABLET | ORAL | Status: DC | PRN

## 2021-06-08 MED ORDER — HYDROCODONE-ACETAMINOPHEN 5-325 MG PO TABS: 1.0000 | ORAL_TABLET | Freq: Four times a day (QID) | ORAL | 0 refills | Status: DC | PRN

## 2021-06-08 MED ORDER — EPHEDRINE SULFATE-NACL 50-0.9 MG/10ML-% IV SOSY
PREFILLED_SYRINGE | INTRAVENOUS | Status: DC | PRN
  Administered 2021-06-08 (×2): 10 mg via INTRAVENOUS

## 2021-06-08 MED ORDER — OXYCODONE HCL 5 MG PO TABS: 5.0000 mg | ORAL_TABLET | ORAL | Status: DC | PRN

## 2021-06-08 MED ORDER — ONDANSETRON HCL 4 MG/2ML IJ SOLN
INTRAMUSCULAR | Status: DC | PRN
  Administered 2021-06-08: 4 mg via INTRAVENOUS

## 2021-06-08 MED ORDER — SODIUM CHLORIDE 0.9 % IR SOLN
Status: DC | PRN
  Administered 2021-06-08: 3000 mL

## 2021-06-08 MED ORDER — ONDANSETRON HCL 4 MG/2ML IJ SOLN
INTRAMUSCULAR | Status: AC
  Filled 2021-06-08: qty 2

## 2021-06-08 MED ORDER — PROPOFOL 10 MG/ML IV BOLUS
INTRAVENOUS | Status: AC
  Filled 2021-06-08: qty 20

## 2021-06-08 MED ORDER — FENTANYL CITRATE (PF) 100 MCG/2ML IJ SOLN: 25.0000 ug | INTRAMUSCULAR | Status: DC | PRN

## 2021-06-08 MED ORDER — OXYCODONE HCL 5 MG/5ML PO SOLN
5.0000 mg | Freq: Once | ORAL | Status: DC | PRN
Start: 2021-06-08 — End: 2021-06-08

## 2021-06-08 MED ORDER — EPHEDRINE 5 MG/ML INJ
INTRAVENOUS | Status: AC
  Filled 2021-06-08: qty 5

## 2021-06-08 MED ORDER — SODIUM CHLORIDE 0.9 % IV SOLN: 250.0000 mL | INTRAVENOUS | Status: DC | PRN

## 2021-06-08 MED ORDER — DEXAMETHASONE SODIUM PHOSPHATE 10 MG/ML IJ SOLN
INTRAMUSCULAR | Status: DC | PRN
  Administered 2021-06-08: 5 mg via INTRAVENOUS

## 2021-06-08 MED ORDER — SODIUM CHLORIDE 0.9% FLUSH: 3.0000 mL | Freq: Two times a day (BID) | INTRAVENOUS | Status: DC

## 2021-06-08 MED ORDER — 0.9 % SODIUM CHLORIDE (POUR BTL) OPTIME
TOPICAL | Status: DC | PRN
  Administered 2021-06-08: 500 mL

## 2021-06-08 MED ORDER — CEFAZOLIN SODIUM-DEXTROSE 2-4 GM/100ML-% IV SOLN
2.0000 g | INTRAVENOUS | Status: AC
  Administered 2021-06-08: 2 g via INTRAVENOUS

## 2021-06-08 MED ORDER — FENTANYL CITRATE (PF) 100 MCG/2ML IJ SOLN
INTRAMUSCULAR | Status: AC
  Filled 2021-06-08: qty 2

## 2021-06-08 MED ORDER — LIDOCAINE HCL (PF) 2 % IJ SOLN
INTRAMUSCULAR | Status: AC
  Filled 2021-06-08: qty 5

## 2021-06-08 MED ORDER — ONDANSETRON HCL 4 MG/2ML IJ SOLN: 4.0000 mg | Freq: Once | INTRAMUSCULAR | Status: DC | PRN

## 2021-06-08 MED ORDER — SODIUM CHLORIDE 0.9% FLUSH: 3.0000 mL | INTRAVENOUS | Status: DC | PRN

## 2021-06-08 MED ORDER — ACETAMINOPHEN 325 MG RE SUPP: 650.0000 mg | RECTAL | Status: DC | PRN

## 2021-06-08 MED ORDER — ARTIFICIAL TEARS OPHTHALMIC OINT
TOPICAL_OINTMENT | OPHTHALMIC | Status: AC
  Filled 2021-06-08: qty 3.5

## 2021-06-08 MED ORDER — ACETAMINOPHEN 10 MG/ML IV SOLN: 1000.0000 mg | Freq: Once | INTRAVENOUS | Status: DC | PRN

## 2021-06-08 MED ORDER — OXYCODONE HCL 5 MG PO TABS: 5.0000 mg | ORAL_TABLET | Freq: Once | ORAL | Status: DC | PRN

## 2021-06-08 MED ORDER — CEFAZOLIN SODIUM-DEXTROSE 2-4 GM/100ML-% IV SOLN
INTRAVENOUS | Status: AC
  Filled 2021-06-08: qty 100

## 2021-06-08 MED ORDER — MIDAZOLAM HCL 2 MG/2ML IJ SOLN
INTRAMUSCULAR | Status: AC
  Filled 2021-06-08: qty 2

## 2021-06-08 MED ORDER — LACTATED RINGERS IV SOLN: INTRAVENOUS | Status: DC

## 2021-06-08 MED ORDER — LIDOCAINE 2% (20 MG/ML) 5 ML SYRINGE
INTRAMUSCULAR | Status: DC | PRN
  Administered 2021-06-08: 100 mg via INTRAVENOUS

## 2021-06-08 MED ORDER — MORPHINE SULFATE (PF) 4 MG/ML IV SOLN
2.0000 mg | INTRAVENOUS | Status: DC | PRN
Start: 2021-06-08 — End: 2021-06-08

## 2021-06-08 MED ORDER — DEXAMETHASONE SODIUM PHOSPHATE 10 MG/ML IJ SOLN
INTRAMUSCULAR | Status: AC
  Filled 2021-06-08: qty 1

## 2021-06-08 SURGICAL SUPPLY — 23 items
BAG DRAIN URO-CYSTO SKYTR STRL (DRAIN) ×2 IMPLANT
BAG DRN UROCATH (DRAIN) ×1
BAG URINE LEG 500ML (DRAIN) ×1 IMPLANT
CATH FOLEY 2WAY SLVR  5CC 20FR (CATHETERS) ×2
CATH FOLEY 2WAY SLVR 5CC 20FR (CATHETERS) IMPLANT
CLOTH BEACON ORANGE TIMEOUT ST (SAFETY) ×2 IMPLANT
COVER DOME SNAP 22 D (MISCELLANEOUS) ×1 IMPLANT
FIBER LASER FLEXIVA 1000 (UROLOGICAL SUPPLIES) ×1 IMPLANT
FIBER LASER FLEXIVA 365 (UROLOGICAL SUPPLIES) IMPLANT
FIBER LASER FLEXIVA 550 (UROLOGICAL SUPPLIES) IMPLANT
GLOVE SURG POLYISO LF SZ8 (GLOVE) ×2 IMPLANT
GLOVE SURG UNDER POLY LF SZ7.5 (GLOVE) ×2 IMPLANT
GOWN STRL REUS W/TWL LRG LVL3 (GOWN DISPOSABLE) ×1 IMPLANT
GOWN STRL REUS W/TWL XL LVL3 (GOWN DISPOSABLE) ×2 IMPLANT
KIT TURNOVER CYSTO (KITS) ×2 IMPLANT
MANIFOLD NEPTUNE II (INSTRUMENTS) ×2 IMPLANT
NS IRRIG 500ML POUR BTL (IV SOLUTION) ×1 IMPLANT
PACK CYSTO (CUSTOM PROCEDURE TRAY) ×2 IMPLANT
TRACTIP FLEXIVA PULS ID 200XHI (Laser) IMPLANT
TRACTIP FLEXIVA PULSE ID 200 (Laser)
TUBE CONNECTING 12X1/4 (SUCTIONS) ×1 IMPLANT
WATER STERILE IRR 3000ML UROMA (IV SOLUTION) ×3 IMPLANT
WATER STERILE IRR 500ML POUR (IV SOLUTION) IMPLANT

## 2021-06-08 NOTE — Anesthesia Procedure Notes (Signed)
Procedure Name: LMA Insertion Date/Time: 06/08/2021 10:49 AM Performed by: Bishop Limbo, CRNA Pre-anesthesia Checklist: Patient identified, Emergency Drugs available, Suction available and Patient being monitored Patient Re-evaluated:Patient Re-evaluated prior to induction Oxygen Delivery Method: Circle System Utilized Preoxygenation: Pre-oxygenation with 100% oxygen Induction Type: IV induction Ventilation: Mask ventilation without difficulty LMA: LMA inserted LMA Size: 5.0 Number of attempts: 1 Placement Confirmation: positive ETCO2 Tube secured with: Tape Dental Injury: Teeth and Oropharynx as per pre-operative assessment

## 2021-06-08 NOTE — Interval H&P Note (Signed)
History and Physical Interval Note:  06/08/2021 10:32 AM  Tyler [name]  has presented today for surgery, with the diagnosis of BLADDER STONES.  The various methods of treatment have been discussed with the patient and family. After consideration of risks, benefits and other options for treatment, the patient has consented to  Procedure(s): CYSTOSCOPY WITH LITHOLAPAXY (N/A) as a surgical intervention.  The patient's history has been reviewed, patient examined, no change in status, stable for surgery.  I have reviewed the patient's chart and labs.  Questions were answered to the patient's satisfaction.     Bjorn Pippin

## 2021-06-08 NOTE — Anesthesia Preprocedure Evaluation (Addendum)
Anesthesia Evaluation  Patient identified by MRN, date of birth, ID band Patient awake    Reviewed: Allergy & Precautions, NPO status , Patient's Chart, lab work & pertinent test results  Airway Mallampati: II  TM Distance: >3 FB Neck ROM: Full    Dental no notable dental hx.    Pulmonary neg pulmonary ROS, former smoker,    Pulmonary exam normal breath sounds clear to auscultation       Cardiovascular hypertension, Normal cardiovascular exam Rhythm:Regular Rate:Normal     Neuro/Psych negative neurological ROS  negative psych ROS   GI/Hepatic Neg liver ROS, GERD  ,  Endo/Other  negative endocrine ROS  Renal/GU negative Renal ROS  negative genitourinary   Musculoskeletal negative musculoskeletal ROS (+)   Abdominal   Peds negative pediatric ROS (+)  Hematology negative hematology ROS (+)   Anesthesia Other Findings   Reproductive/Obstetrics negative OB ROS                            Anesthesia Physical Anesthesia Plan  ASA: 2  Anesthesia Plan: General   Post-op Pain Management:    Induction: Intravenous  PONV Risk Score and Plan: 2 and Ondansetron, Dexamethasone and Treatment may vary due to age or medical condition  Airway Management Planned: LMA  Additional Equipment:   Intra-op Plan:   Post-operative Plan: Extubation in OR  Informed Consent: I have reviewed the patients History and Physical, chart, labs and discussed the procedure including the risks, benefits and alternatives for the proposed anesthesia with the patient or authorized representative who has indicated his/her understanding and acceptance.     Dental advisory given  Plan Discussed with: CRNA and Surgeon  Anesthesia Plan Comments:         Anesthesia Quick Evaluation

## 2021-06-08 NOTE — Op Note (Signed)
Procedure: Cystolitholapaxy for less than 2.5 cm stone.  Preop diagnosis: Multiple bladder stones.  Postop diagnosis: Same.  Surgeon: Dr. Bjorn Pippin.  Anesthesia: General.  Specimen: Stone fragments.  Drain: 20 [place] Foley catheter.  EBL: None.  Complications: None.  Indications: The patient is a 75 year old male with a history of BPH and bladder outlet obstruction was recently found to have multiple bladder stones on evaluation for hematuria.  The largest was approximately 1.2 cm with an 8 mm stone and several smaller stones.  It was felt that cystolitholapaxy was indicated.  I also discussed TURP but he did not want to pursue that at this time.  Procedure: He was given antibiotics.  He was taken operating room where general anesthetic was induced.  He was placed in lithotomy position and fitted with PAS hose.  His perineum and genitalia were prepped Betadine solution he was draped in usual sterile fashion.  Cystoscopy was performed using the greenlight laser scope with a 30 degree lens.  Examination revealed a normal urethra.  The external sphincter was intact.  The prostatic urethra was approximately 4 to 5 cm in length with bilobar hyperplasia with coaptation and obstruction.  There was some intravesical extension but no significant middle lobe.  Examination of bladder demonstrated mild trabeculation without tumors.  The ureteral orifices were unremarkable.  There were multiple light tan bladder stones at the base the bladder consistent with uric acid.  Several small stones were aspirated out through the laser scope.  The 8 mm stone was also able to be removed with aspiration.  The larger stone however required laser fragmentation.  A 1000 m holmium laser fiber was passed with the laser set on 1 J and 20 Hz.  The stone was then broken into manageable fragments which were aspirated through the scope.  Final inspection demonstrated no residual fragments.  No bladder wall injury.  There  was some bleeding from the prostatic urethra and as result it was felt that a Foley catheter was indicated.  The scope was removed and a 20 [place] Foley catheter was inserted.  The balloon was filled with 10 cc of sterile fluid.  The catheter was irrigated until the return was clear and then placed a leg bag drainage.  He was taken down from lithotomy position, his anesthetic was reversed and he was moved recovery in stable condition.  There were no complications.  A portion of the stone material be taken to the office for analysis.

## 2021-06-08 NOTE — Anesthesia Postprocedure Evaluation (Signed)
Anesthesia Post Note  Patient: Tyler [name]  Procedure(s) Performed: CYSTOSCOPY WITH LITHOLAPAXY (Bladder)     Patient location during evaluation: PACU Anesthesia Type: General Level of consciousness: awake and alert Pain management: pain level controlled Vital Signs Assessment: post-procedure vital signs reviewed and stable Respiratory status: spontaneous breathing, nonlabored ventilation, respiratory function stable and patient connected to nasal cannula oxygen Cardiovascular status: blood pressure returned to baseline and stable Postop Assessment: no apparent nausea or vomiting Anesthetic complications: no   No notable events documented.  Last Vitals:  Vitals:   06/08/21 1145 06/08/21 1200  BP: 115/66 128/75  Pulse: (!) 55 (!) 57  Resp: 15 16  Temp:    SpO2: 95% 97%    Last Pain:  Vitals:   06/08/21 1200  TempSrc:   PainSc: 0-No pain                 Lynanne Delgreco S     

## 2021-06-08 NOTE — Anesthesia Postprocedure Evaluation (Signed)
Anesthesia Post Note  Patient: Tyler [name]  Procedure(s) Performed: CYSTOSCOPY WITH LITHOLAPAXY (Bladder)     Patient location during evaluation: PACU Anesthesia Type: General Level of consciousness: awake and alert Pain management: pain level controlled Vital Signs Assessment: post-procedure vital signs reviewed and stable Respiratory status: spontaneous breathing, nonlabored ventilation, respiratory function stable and patient connected to nasal cannula oxygen Cardiovascular status: blood pressure returned to baseline and stable Postop Assessment: no apparent nausea or vomiting Anesthetic complications: no   No notable events documented.  Last Vitals:  Vitals:   06/08/21 1145 06/08/21 1200  BP: 115/66 128/75  Pulse: (!) 55 (!) 57  Resp: 15 16  Temp:    SpO2: 95% 97%    Last Pain:  Vitals:   06/08/21 1200  TempSrc:   PainSc: 0-No pain                 Cela Newcom S

## 2021-06-08 NOTE — Discharge Instructions (Addendum)
CYSTOSCOPY HOME CARE INSTRUCTIONS  Activity: Rest for the remainder of the day.  Do not drive or operate equipment today.  You may resume normal activities in one to two days as instructed by your physician.   Meals: Drink plenty of liquids and eat light foods such as gelatin or soup this evening.  You may return to a normal meal plan tomorrow.  Return to Work: You may return to work in one to two days or as instructed by your physician.  Special Instructions / Symptoms: Call your physician if any of these symptoms occur:   -persistent or heavy bleeding  -bleeding which continues after first few urination  -large blood clots that are difficult to pass  -urine stream diminishes or stops completely  -fever equal to or higher than 101 degrees Farenheit.  -cloudy urine with a strong, foul odor  -severe pain  You may remove the catheter in the morning if you feel comfortable doing that, otherwise we will need to have you come to the office to have it removed on Monday.   To remove the catheter you use scissors to cut of the yellow nipple on the side arm.  Some fluid will drain and the catheter should slide out easily.    Post Anesthesia Home Care Instructions  Activity: Get plenty of rest for the remainder of the day. A responsible individual must stay with you for 24 hours following the procedure.  For the next 24 hours, DO NOT: -Drive a car -Advertising copywriter -Drink alcoholic beverages -Take any medication unless instructed by your physician -Make any legal decisions or sign important papers.  Meals: Start with liquid foods such as gelatin or soup. Progress to regular foods as tolerated. Avoid greasy, spicy, heavy foods. If nausea and/or vomiting occur, drink only clear liquids until the nausea and/or vomiting subsides. Call your physician if vomiting continues.  Special Instructions/Symptoms: Your throat may feel dry or sore from the anesthesia or the breathing tube placed in  your throat during surgery. If this causes discomfort, gargle with warm salt water. The discomfort should disappear within 24 hours.

## 2021-06-08 NOTE — Transfer of Care (Signed)
Immediate Anesthesia Transfer of Care Note  Patient: Fayrene Fearing A Stokes  Procedure(s) Performed: CYSTOSCOPY WITH LITHOLAPAXY (Bladder)  Patient Location: PACU  Anesthesia Type:General  Level of Consciousness: awake, alert  and oriented  Airway & Oxygen Therapy: Patient Spontanous Breathing and Patient connected to nasal cannula oxygen  Post-op Assessment: Report given to RN  Post vital signs: Reviewed and stable  Last Vitals:  Vitals Value Taken Time  BP 140/77   Temp    Pulse 60 06/08/21 1128  Resp 15 06/08/21 1129  SpO2 99 % 06/08/21 1128  Vitals shown include unvalidated device data.  Last Pain:  Vitals:   06/08/21 0941  TempSrc: Oral      Patients Stated Pain Goal: 5 (06/08/21 0941)  Complications: No notable events documented.

## 2021-06-08 NOTE — H&P (Signed)
I have symptoms of an enlarged prostate.     Tyler [name] returns today in f/u for cystoscopy and voiding studies. He remains on tamsulosin and finasteride and was found to have bladder stones on CT. He was placed on potassium citrate for presumed uric acid stones. He had some irritation after voiding recently that was consistent with him passing a stone. He has frequency, urgency and nocturia. He has had UUI. He has had no hematuria.    Gu Hx; Tyler [name] is a 75 yo male who has passed several stone over the last few months with the first 3 coming out around Thanksgiving after sitting in a massage chair. He passed additional stones in March after riding horses and then he passed 5 additional small stones in the last week. Dr. August Saucer sent the stones off for analysis and they were uric acid stones with a small amount of calcium oxalate. He was given tamsulosin and pain med. He has had a couple of episodes of possible gout in the past and had hyperuricemia on recent labs. He has not had any recent imaging. He has hematuria with the passage of the stones. He has moderate LUTS with an IPSS of 19. He as some frequency and urgency. He has nocturia x 3. He has an occasionally reduced stream. He has had no flank pain. He has no prior GU history. His uric acid was 7.5 on recent labs and his PSA was 2.03.     ALLERGIES: None   MEDICATIONS: Doxycycline Hyclate 100 mg tablet  Finasteride 5 mg tablet 1 tablet PO Daily  Lisinopril 20 mg tablet  Omeprazole 40 mg capsule,delayed release  Tamsulosin Hcl 0.4 mg capsule  Amlodipine Besylate 10 mg tablet  Azo  Lisinopril-Hydrochlorothiazide 20 mg-12.5 mg tablet  Potassium Citrate Er 10 meq (1,080 mg) tablet, extended release 1 tablet PO TID  Quetiapine Fumarate 25 mg tablet  Rosuvastatin Calcium 20 mg tablet     GU PSH: No GU PSH    NON-GU PSH: Back surgery - 2016 Back Surgery (Unspecified) - 2005     GU PMH: Bladder Stone, He has several bladder stones with the largest  about 19mm. He has no renal stones. I will start potassium citrate and reviewed the side effects and need for f/u labs. I have given him info on low purine diet. - 04/02/2021 BPH w/LUTS, He had increase LUTS with the stones and BPH. His PVR is 53ml. I will add finasteride since his prostate was about on CT. I will have him return for a flowrate, PVR and prostate [place] for volume as well as cystoscopy prior to determining subsequent therapy. He may need a TURP or simple prostatectomy with stone removal. - 04/02/2021 Incomplete bladder emptying, PVR 4ml. - 04/02/2021 Nocturia - 04/02/2021 Renal cyst, He has left renal cysts. I will await radiology recommendations as far as need for additional evaluation. - 04/02/2021 Urinary Urgency - 04/02/2021    NON-GU PMH: Gout Hypercholesterolemia Hypertension    FAMILY HISTORY: 2 sons - Other Death In The Family Father - Other Death In The Family Mother - Other Heart Disease - Runs in Family   SOCIAL HISTORY: Marital Status: Married Preferred Language: English; Ethnicity: Not Hispanic Or Latino; Race: Black or African American Current Smoking Status: Patient has never smoked.   Tobacco Use Assessment Completed: Used Tobacco in last 30 days? Does not use smokeless tobacco. Drinks 2 drinks per day.  Does not use drugs.    REVIEW OF SYSTEMS:    GU  Review Male:   Patient reports frequent urination and get up at night to urinate. Patient denies hard to postpone urination, burning/ pain with urination, leakage of urine, stream starts and stops, trouble starting your stream, have to strain to urinate , erection problems, and penile pain.  Gastrointestinal (Upper):   Patient denies nausea, vomiting, and indigestion/ heartburn.  Gastrointestinal (Lower):   Patient denies diarrhea and constipation.  Constitutional:   Patient denies fever, night sweats, weight loss, and fatigue.  Skin:   Patient denies skin rash/ lesion and itching.  Eyes:   Patient denies  blurred vision and double vision.  Ears/ Nose/ Throat:   Patient denies sore throat and sinus problems.  Hematologic/Lymphatic:   Patient denies swollen glands and easy bruising.  Cardiovascular:   Patient denies leg swelling and chest pains.  Respiratory:   Patient denies cough and shortness of breath.  Endocrine:   Patient denies excessive thirst.  Musculoskeletal:   Patient denies back pain and joint pain.  Neurological:   Patient denies headaches and dizziness.  Psychologic:   Patient denies depression and anxiety.   Notes: Urgent urination    VITAL SIGNS: None   MULTI-SYSTEM PHYSICAL EXAMINATION:    Constitutional: Well-nourished. No physical deformities. Normally developed. Good grooming.  Respiratory: Normal breath sounds. No labored breathing, no use of accessory muscles.   Cardiovascular: Regular rate and rhythm. No murmur, no gallop.      Complexity of Data:  Urine Test Review:   Urinalysis  Urodynamics Review:   Review Bladder Scan, Review Flow Rate   PROCEDURES:         Flexible Cystoscopy - 52000  Risks, benefits, and some of the potential complications of the procedure were discussed. 42ml of 2% lidocaine jelly was instilled intraurethrally.  Cipro 500mg  given for antibiotic prophylaxis.    Meatus:  Normal size. Normal location. Normal condition.  Urethra:  No strictures.  External Sphincter:  Normal.  Verumontanum:  Normal.  Prostate:  Obstructing. Moderate hyperplasia.  Bladder Neck:  Non-obstructing.  Ureteral Orifices:  Normal location. Normal size. Normal shape. Effluxed clear urine.  Bladder:  Mild trabeculation. Erythematous mucosa that appears inflammatory secondary to 2 8-70mm stones in the bladder.       The procedure was well tolerated and there were no complications.        PVR Ultrasound - 9m  Scanned Volume: 75 cc   ASSESSMENT:      ICD-10 Details  1 GU:   BPH w/LUTS - N40.1 Chronic, Stable - He has BPH with BOO with obstructive and  irritative voiding symptoms. He has 2 bladder stones about 8-42mm in size with bladder wall irritation. I discussed options including removal of the stones and reevaluation of the voiding symptoms after removal or stone removal and TURP simultaneously. I reviewd the risks of a TURP including bleeding, infection, incontinence, stricture, need for secondary procedures, ejaculatory and erectile dysfunction, thrombotic events, fluid overload and anesthetic complications. I explained that 95% of men will have relief of the obstructive symptoms and about 70% will have relief of the irritative symptoms. He would like to have the stones removed and see how he does first.   2   Incomplete bladder emptying - R39.14 Chronic, Stable  3   Urinary Urgency - R39.15 Chronic, Stable  4   Nocturia - R35.1 Chronic, Stable  5   Bladder Stone - N21.0 Chronic, Stable - The stones have not resolved with the potassium citrate and are causing bladder irritation.  PLAN:           Schedule Return Visit/Planned Activity: Next Available Appointment - Schedule Surgery             Note: cystolitholopaxy.   Procedure: Unspecified Date - Cysto Bladder Stone <2.5cm - 02637          Document Letter(s):  Created for Patient: Clinical Summary

## 2021-06-11 ENCOUNTER — Encounter (HOSPITAL_BASED_OUTPATIENT_CLINIC_OR_DEPARTMENT_OTHER): Payer: Self-pay | Admitting: Urology

## 2021-06-12 ENCOUNTER — Ambulatory Visit (INDEPENDENT_AMBULATORY_CARE_PROVIDER_SITE_OTHER): Payer: Medicare Other | Admitting: *Deleted

## 2021-06-12 DIAGNOSIS — J309 Allergic rhinitis, unspecified: Secondary | ICD-10-CM

## 2021-06-20 ENCOUNTER — Ambulatory Visit (INDEPENDENT_AMBULATORY_CARE_PROVIDER_SITE_OTHER): Payer: Medicare Other

## 2021-06-20 DIAGNOSIS — J309 Allergic rhinitis, unspecified: Secondary | ICD-10-CM

## 2021-06-29 ENCOUNTER — Ambulatory Visit (INDEPENDENT_AMBULATORY_CARE_PROVIDER_SITE_OTHER): Payer: Medicare Other

## 2021-06-29 DIAGNOSIS — J309 Allergic rhinitis, unspecified: Secondary | ICD-10-CM | POA: Diagnosis not present

## 2021-07-09 ENCOUNTER — Ambulatory Visit (INDEPENDENT_AMBULATORY_CARE_PROVIDER_SITE_OTHER): Payer: Medicare Other | Admitting: *Deleted

## 2021-07-09 DIAGNOSIS — J309 Allergic rhinitis, unspecified: Secondary | ICD-10-CM | POA: Diagnosis not present

## 2021-08-02 ENCOUNTER — Ambulatory Visit (INDEPENDENT_AMBULATORY_CARE_PROVIDER_SITE_OTHER): Payer: Medicare Other | Admitting: *Deleted

## 2021-08-02 DIAGNOSIS — J309 Allergic rhinitis, unspecified: Secondary | ICD-10-CM

## 2021-08-27 ENCOUNTER — Ambulatory Visit (INDEPENDENT_AMBULATORY_CARE_PROVIDER_SITE_OTHER): Payer: Medicare Other | Admitting: *Deleted

## 2021-08-27 DIAGNOSIS — J309 Allergic rhinitis, unspecified: Secondary | ICD-10-CM

## 2021-09-27 DIAGNOSIS — J301 Allergic rhinitis due to pollen: Secondary | ICD-10-CM | POA: Diagnosis not present

## 2021-09-28 DIAGNOSIS — J3081 Allergic rhinitis due to animal (cat) (dog) hair and dander: Secondary | ICD-10-CM | POA: Diagnosis not present

## 2021-10-01 DIAGNOSIS — J3089 Other allergic rhinitis: Secondary | ICD-10-CM | POA: Diagnosis not present

## 2021-10-01 NOTE — Progress Notes (Signed)
VIALS MADE. EXP 10-01-22 

## 2021-10-02 ENCOUNTER — Ambulatory Visit (INDEPENDENT_AMBULATORY_CARE_PROVIDER_SITE_OTHER): Payer: Medicare Other | Admitting: *Deleted

## 2021-10-02 DIAGNOSIS — J309 Allergic rhinitis, unspecified: Secondary | ICD-10-CM | POA: Diagnosis not present

## 2021-11-02 ENCOUNTER — Ambulatory Visit (INDEPENDENT_AMBULATORY_CARE_PROVIDER_SITE_OTHER): Payer: Medicare Other

## 2021-11-02 DIAGNOSIS — J309 Allergic rhinitis, unspecified: Secondary | ICD-10-CM | POA: Diagnosis not present

## 2021-12-07 ENCOUNTER — Ambulatory Visit (INDEPENDENT_AMBULATORY_CARE_PROVIDER_SITE_OTHER): Payer: Medicare Other

## 2021-12-07 DIAGNOSIS — J309 Allergic rhinitis, unspecified: Secondary | ICD-10-CM

## 2021-12-11 ENCOUNTER — Ambulatory Visit (INDEPENDENT_AMBULATORY_CARE_PROVIDER_SITE_OTHER): Payer: Medicare Other | Admitting: Allergy and Immunology

## 2021-12-11 ENCOUNTER — Encounter: Payer: Self-pay | Admitting: Allergy and Immunology

## 2021-12-11 ENCOUNTER — Other Ambulatory Visit: Payer: Self-pay

## 2021-12-11 VITALS — BP 110/82 | HR 86 | Temp 98.2°F | Resp 18 | Ht 66.0 in | Wt 220.0 lb

## 2021-12-11 DIAGNOSIS — J3089 Other allergic rhinitis: Secondary | ICD-10-CM | POA: Diagnosis not present

## 2021-12-11 DIAGNOSIS — J301 Allergic rhinitis due to pollen: Secondary | ICD-10-CM

## 2021-12-11 MED ORDER — FLUTICASONE PROPIONATE 50 MCG/ACT NA SUSP: 2.0000 | Freq: Every day | NASAL | 5 refills | Status: AC | PRN

## 2021-12-11 NOTE — Progress Notes (Signed)
[place] - [place] - [place] - [place] - [place]   Follow-up Note  Referring Provider: Gwenyth Bender, MD Primary Provider: Gwenyth Bender, MD Date of Office Visit: 12/11/2021  Subjective:   Tyler [name] (DOB: [birth year]) is a 76 y.o. male who [facility] in re-evaluation of the following:  HPI: Tyler [name] returns to this clinic in evaluation of allergic rhinoconjunctivitis.  His last visit to this clinic with a provider was 26 November 2019.  He continues to use immunotherapy currently at every 4 weeks without any adverse effect.  This form of therapy has resulted in dramatic improvement regarding both his nose and eyes issues.  He still occasionally uses some Flonase and antihistamine and Mucinex during the spring and fall but overall has done very well with seasonal and nonseasonal atopic disease affecting his eyes and nose on his current treatment.  Allergies as of 12/11/2021   No Known Allergies      Medication List    amLODipine 10 MG tablet Commonly known as: NORVASC Take 10 mg by mouth daily.   dextromethorphan-guaiFENesin 30-600 MG 12hr tablet Commonly known as: MUCINEX DM Take 1 tablet by mouth 2 (two) times daily as needed for cough.   finasteride 5 MG tablet Commonly known as: PROSCAR Take 5 mg by mouth daily.   fluticasone 50 MCG/ACT nasal spray Commonly known as: FLONASE Place 2 sprays into both nostrils daily as needed.   lisinopril-hydrochlorothiazide 20-25 MG tablet Commonly known as: ZESTORETIC TAKE 1 TABLET BY MOUTH DAILY. PATIENT NEEDS TO SCHEDULE AN APPOINTMENT FOR REFILLS   meloxicam 15 MG tablet Commonly known as: MOBIC Take 15 mg by mouth daily.   Olmesartan-amLODIPine-HCTZ 40-10-25 MG Tabs Take 1 tablet by mouth daily.   omeprazole 40 MG capsule Commonly known as: PRILOSEC Take 40 mg by mouth daily.   polyethylene glycol-electrolytes 420 g solution Commonly known as: NuLYTELY See  admin instructions.   Potassium 99 MG Tabs Take 99 mg by mouth 3 (three) times daily.   potassium citrate 10 MEQ (1080 MG) SR tablet Commonly known as: UROCIT-K Take 10 mEq by mouth 3 (three) times daily.   QUEtiapine 25 MG tablet Commonly known as: SEROQUEL Take 25 mg by mouth at bedtime.   rosuvastatin 40 MG tablet Commonly known as: CRESTOR Take 0.5 tablets (20 mg total) by mouth daily.   tamsulosin 0.4 MG Caps capsule Commonly known as: FLOMAX Take 1 capsule by mouth daily.    Past Medical History:  Diagnosis Date   Bladder calculi    BPH (benign prostatic hyperplasia)    Family history of premature CAD    cardiologist--- dr Jacinto Halim-- Theron Arista in epic 05-03-2021 (pt was released prn basis),  per note pt  had nuclear stress test ,  low risk no ischemia ef 49% and echo ef 55%, moderate concentric LVH G1DD mild LAE   GERD (gastroesophageal reflux disease)    Hyperlipidemia    Hypertension    Pre-diabetes    Wears dentures    upper    Past Surgical History:  Procedure Laterality Date   CATARACT EXTRACTION W/ INTRAOCULAR LENS IMPLANT Bilateral 2014   CYSTOSCOPY WITH LITHOLAPAXY N/A 06/08/2021   Procedure: CYSTOSCOPY WITH LITHOLAPAXY;  Surgeon: Bjorn Pippin, MD;  Location: [facility];  Service: Urology;  Laterality: N/A;   LUMBAR FUSION  03/14/2004   @[facility]  by dr ;   L5--S1   LUMBAR FUSION  08/30/2015   @[facility]  by cabbell;  L4--L5   LUMBAR LAMINECTOMY/DECOMPRESSION MICRODISCECTOMY N/A 05/19/2015   Procedure: Lumbar four- five diskectomy;  Surgeon: Coletta Memos, MD;  Location: [facility];  Service: Neurosurgery;  Laterality: N/A;  L45 diskectomy    Review of systems negative except as noted in HPI / PMHx or noted below:  Review of Systems  Constitutional: Negative.   HENT: Negative.    Eyes: Negative.   Respiratory: Negative.    Cardiovascular: Negative.   Gastrointestinal: Negative.   Genitourinary: Negative.   Musculoskeletal: Negative.   Skin:  Negative.   Neurological: Negative.   Endo/Heme/Allergies: Negative.   Psychiatric/Behavioral: Negative.      Objective:   Vitals:   12/11/21 1056  BP: 110/82  Pulse: 86  Resp: 18  Temp: 98.2 F (36.8 C)  SpO2: 97%   Height: 5\' 6"  (167.6 cm)  Weight: 220 lb (99.8 kg)   Physical Exam Constitutional:      Appearance: He is not diaphoretic.  HENT:     Head: Normocephalic.     Right Ear: Tympanic membrane, ear canal and external ear normal.     Left Ear: Tympanic membrane, ear canal and external ear normal.     Nose: Nose normal. No mucosal edema or rhinorrhea.     Mouth/Throat:     Pharynx: Uvula midline. No oropharyngeal exudate.  Eyes:     Conjunctiva/sclera: Conjunctivae normal.  Neck:     Thyroid: No thyromegaly.     Trachea: Trachea normal. No tracheal tenderness or tracheal deviation.  Cardiovascular:     Rate and Rhythm: Normal rate and regular rhythm.     Heart sounds: Normal heart sounds, S1 normal and S2 normal. No murmur heard. Pulmonary:     Effort: No respiratory distress.     Breath sounds: Normal breath sounds. No stridor. No wheezing or rales.  Lymphadenopathy:     Head:     Right side of head: No tonsillar adenopathy.     Left side of head: No tonsillar adenopathy.     Cervical: No cervical adenopathy.  Skin:    Findings: No erythema or rash.     Nails: There is no clubbing.  Neurological:     Mental Status: He is alert.    Diagnostics: none   Assessment and Plan:   1. Perennial allergic rhinitis   2. Seasonal allergic rhinitis due to pollen     1.  Continue immunotherapy (and Epi-pen)  2.  If needed:  A. Flonase - 1-2 sprays each nostril 1 time per day (takes days to work) B. OTC antihistamine C. OTC mucinex D. OTC nasal saline  3. Return to clinic in 1 year or earlier if problem  appears to be doing very well with immunotherapy and it has really changed his atopic phenotype dramatically and he will continue to use this form  of treatment and we will see him back in this clinic in 1 year or earlier if there is a problem.  Tyler Ashing, MD Allergy / Immunology [facility]

## 2021-12-11 NOTE — Patient Instructions (Signed)
°  1.  Continue immunotherapy (and Epi-pen)  2.  If needed:  A. Flonase - 1-2 sprays each nostril 1 time per day (takes days to work) B. OTC antihistamine C. OTC mucinex D. OTC nasal saline  3. Return to clinic in 1 year or earlier if problem

## 2021-12-12 ENCOUNTER — Encounter: Payer: Self-pay | Admitting: Allergy and Immunology

## 2022-01-02 ENCOUNTER — Ambulatory Visit (INDEPENDENT_AMBULATORY_CARE_PROVIDER_SITE_OTHER): Payer: Medicare Other

## 2022-01-02 DIAGNOSIS — J309 Allergic rhinitis, unspecified: Secondary | ICD-10-CM | POA: Diagnosis not present

## 2022-01-11 ENCOUNTER — Ambulatory Visit (INDEPENDENT_AMBULATORY_CARE_PROVIDER_SITE_OTHER): Payer: Medicare Other

## 2022-01-11 DIAGNOSIS — J309 Allergic rhinitis, unspecified: Secondary | ICD-10-CM

## 2022-01-22 ENCOUNTER — Ambulatory Visit (INDEPENDENT_AMBULATORY_CARE_PROVIDER_SITE_OTHER): Payer: Medicare Other

## 2022-01-22 DIAGNOSIS — J309 Allergic rhinitis, unspecified: Secondary | ICD-10-CM | POA: Diagnosis not present

## 2022-02-01 ENCOUNTER — Ambulatory Visit (INDEPENDENT_AMBULATORY_CARE_PROVIDER_SITE_OTHER): Payer: Medicare Other

## 2022-02-01 DIAGNOSIS — J309 Allergic rhinitis, unspecified: Secondary | ICD-10-CM | POA: Diagnosis not present

## 2022-02-20 ENCOUNTER — Ambulatory Visit (INDEPENDENT_AMBULATORY_CARE_PROVIDER_SITE_OTHER): Payer: Medicare Other

## 2022-02-20 DIAGNOSIS — J309 Allergic rhinitis, unspecified: Secondary | ICD-10-CM

## 2022-02-27 ENCOUNTER — Ambulatory Visit (INDEPENDENT_AMBULATORY_CARE_PROVIDER_SITE_OTHER): Payer: Medicare Other

## 2022-02-27 DIAGNOSIS — J309 Allergic rhinitis, unspecified: Secondary | ICD-10-CM | POA: Diagnosis not present

## 2022-03-28 ENCOUNTER — Ambulatory Visit (INDEPENDENT_AMBULATORY_CARE_PROVIDER_SITE_OTHER): Payer: Medicare Other

## 2022-03-28 DIAGNOSIS — J309 Allergic rhinitis, unspecified: Secondary | ICD-10-CM | POA: Diagnosis not present

## 2022-05-03 ENCOUNTER — Ambulatory Visit (INDEPENDENT_AMBULATORY_CARE_PROVIDER_SITE_OTHER): Payer: Medicare Other | Admitting: *Deleted

## 2022-05-03 DIAGNOSIS — J309 Allergic rhinitis, unspecified: Secondary | ICD-10-CM | POA: Diagnosis not present

## 2022-05-29 ENCOUNTER — Encounter (INDEPENDENT_AMBULATORY_CARE_PROVIDER_SITE_OTHER): Payer: Self-pay

## 2022-06-04 ENCOUNTER — Ambulatory Visit (INDEPENDENT_AMBULATORY_CARE_PROVIDER_SITE_OTHER): Payer: Medicare Other | Admitting: *Deleted

## 2022-06-04 DIAGNOSIS — J309 Allergic rhinitis, unspecified: Secondary | ICD-10-CM | POA: Diagnosis not present

## 2022-07-02 ENCOUNTER — Ambulatory Visit (INDEPENDENT_AMBULATORY_CARE_PROVIDER_SITE_OTHER): Payer: Medicare Other | Admitting: *Deleted

## 2022-07-02 DIAGNOSIS — J309 Allergic rhinitis, unspecified: Secondary | ICD-10-CM | POA: Diagnosis not present

## 2022-07-02 NOTE — Progress Notes (Signed)
VIALS EXP 07-03-23 

## 2022-07-03 DIAGNOSIS — J301 Allergic rhinitis due to pollen: Secondary | ICD-10-CM

## 2022-07-04 DIAGNOSIS — J3089 Other allergic rhinitis: Secondary | ICD-10-CM

## 2022-07-05 DIAGNOSIS — J302 Other seasonal allergic rhinitis: Secondary | ICD-10-CM

## 2022-08-07 ENCOUNTER — Ambulatory Visit (INDEPENDENT_AMBULATORY_CARE_PROVIDER_SITE_OTHER): Payer: Medicare Other | Admitting: *Deleted

## 2022-08-07 DIAGNOSIS — J309 Allergic rhinitis, unspecified: Secondary | ICD-10-CM

## 2022-09-16 ENCOUNTER — Ambulatory Visit (INDEPENDENT_AMBULATORY_CARE_PROVIDER_SITE_OTHER): Payer: Medicare Other

## 2022-09-16 DIAGNOSIS — J309 Allergic rhinitis, unspecified: Secondary | ICD-10-CM | POA: Diagnosis not present

## 2022-11-25 ENCOUNTER — Ambulatory Visit (INDEPENDENT_AMBULATORY_CARE_PROVIDER_SITE_OTHER): Payer: Medicare Other

## 2022-11-25 DIAGNOSIS — J309 Allergic rhinitis, unspecified: Secondary | ICD-10-CM

## 2022-12-17 ENCOUNTER — Ambulatory Visit (INDEPENDENT_AMBULATORY_CARE_PROVIDER_SITE_OTHER): Payer: Medicare Other

## 2022-12-17 DIAGNOSIS — J309 Allergic rhinitis, unspecified: Secondary | ICD-10-CM | POA: Diagnosis not present

## 2023-01-01 ENCOUNTER — Ambulatory Visit (INDEPENDENT_AMBULATORY_CARE_PROVIDER_SITE_OTHER): Payer: Medicare Other

## 2023-01-01 DIAGNOSIS — J309 Allergic rhinitis, unspecified: Secondary | ICD-10-CM | POA: Diagnosis not present

## 2023-01-28 ENCOUNTER — Ambulatory Visit (INDEPENDENT_AMBULATORY_CARE_PROVIDER_SITE_OTHER): Payer: Medicare Other | Admitting: *Deleted

## 2023-01-28 DIAGNOSIS — J309 Allergic rhinitis, unspecified: Secondary | ICD-10-CM

## 2023-02-13 ENCOUNTER — Ambulatory Visit (INDEPENDENT_AMBULATORY_CARE_PROVIDER_SITE_OTHER): Payer: Medicare Other

## 2023-02-13 DIAGNOSIS — J309 Allergic rhinitis, unspecified: Secondary | ICD-10-CM

## 2023-02-21 ENCOUNTER — Ambulatory Visit
Admission: RE | Admit: 2023-02-21 | Discharge: 2023-02-21 | Disposition: A | Discharge status: Home / Self Care | Payer: Medicare Other | Source: Ambulatory Visit | Attending: Internal Medicine | Admitting: Internal Medicine

## 2023-02-21 ENCOUNTER — Other Ambulatory Visit: Payer: Self-pay | Admitting: Internal Medicine

## 2023-02-21 ENCOUNTER — Ambulatory Visit (INDEPENDENT_AMBULATORY_CARE_PROVIDER_SITE_OTHER): Payer: Medicare Other

## 2023-02-21 DIAGNOSIS — R059 Cough, unspecified: Secondary | ICD-10-CM

## 2023-02-21 DIAGNOSIS — J309 Allergic rhinitis, unspecified: Secondary | ICD-10-CM

## 2023-02-21 DIAGNOSIS — R062 Wheezing: Secondary | ICD-10-CM

## 2023-02-28 ENCOUNTER — Ambulatory Visit (INDEPENDENT_AMBULATORY_CARE_PROVIDER_SITE_OTHER): Payer: Medicare Other | Admitting: *Deleted

## 2023-02-28 DIAGNOSIS — J309 Allergic rhinitis, unspecified: Secondary | ICD-10-CM

## 2023-03-06 ENCOUNTER — Ambulatory Visit (INDEPENDENT_AMBULATORY_CARE_PROVIDER_SITE_OTHER): Payer: Medicare Other

## 2023-03-06 DIAGNOSIS — J309 Allergic rhinitis, unspecified: Secondary | ICD-10-CM | POA: Diagnosis not present

## 2023-03-12 ENCOUNTER — Ambulatory Visit (INDEPENDENT_AMBULATORY_CARE_PROVIDER_SITE_OTHER): Payer: Medicare Other

## 2023-03-12 DIAGNOSIS — J309 Allergic rhinitis, unspecified: Secondary | ICD-10-CM

## 2023-03-21 ENCOUNTER — Ambulatory Visit (INDEPENDENT_AMBULATORY_CARE_PROVIDER_SITE_OTHER): Payer: Medicare Other

## 2023-03-21 DIAGNOSIS — J309 Allergic rhinitis, unspecified: Secondary | ICD-10-CM

## 2023-04-16 ENCOUNTER — Ambulatory Visit (INDEPENDENT_AMBULATORY_CARE_PROVIDER_SITE_OTHER): Payer: Medicare Other

## 2023-04-16 DIAGNOSIS — J309 Allergic rhinitis, unspecified: Secondary | ICD-10-CM

## 2023-05-21 ENCOUNTER — Ambulatory Visit (INDEPENDENT_AMBULATORY_CARE_PROVIDER_SITE_OTHER): Payer: Medicare Other

## 2023-05-21 DIAGNOSIS — J309 Allergic rhinitis, unspecified: Secondary | ICD-10-CM | POA: Diagnosis not present

## 2023-06-12 DIAGNOSIS — J301 Allergic rhinitis due to pollen: Secondary | ICD-10-CM

## 2023-06-12 NOTE — Progress Notes (Signed)
VIALS EXP 06-11-24

## 2023-06-13 DIAGNOSIS — J3081 Allergic rhinitis due to animal (cat) (dog) hair and dander: Secondary | ICD-10-CM

## 2023-06-16 DIAGNOSIS — J302 Other seasonal allergic rhinitis: Secondary | ICD-10-CM

## 2023-06-19 ENCOUNTER — Ambulatory Visit (INDEPENDENT_AMBULATORY_CARE_PROVIDER_SITE_OTHER): Payer: Medicare Other

## 2023-06-19 DIAGNOSIS — J309 Allergic rhinitis, unspecified: Secondary | ICD-10-CM

## 2023-07-24 ENCOUNTER — Ambulatory Visit (INDEPENDENT_AMBULATORY_CARE_PROVIDER_SITE_OTHER): Payer: Medicare Other

## 2023-07-24 DIAGNOSIS — J309 Allergic rhinitis, unspecified: Secondary | ICD-10-CM

## 2023-07-28 ENCOUNTER — Other Ambulatory Visit: Payer: Self-pay

## 2023-07-28 ENCOUNTER — Encounter: Payer: Self-pay | Admitting: Internal Medicine

## 2023-07-28 ENCOUNTER — Ambulatory Visit (INDEPENDENT_AMBULATORY_CARE_PROVIDER_SITE_OTHER): Payer: Medicare Other | Admitting: Internal Medicine

## 2023-07-28 VITALS — BP 118/70 | HR 64 | Temp 98.0°F | Ht 64.17 in | Wt 227.1 lb

## 2023-07-28 DIAGNOSIS — J302 Other seasonal allergic rhinitis: Secondary | ICD-10-CM

## 2023-07-28 DIAGNOSIS — J3089 Other allergic rhinitis: Secondary | ICD-10-CM

## 2023-07-28 NOTE — Patient Instructions (Signed)
Allergic Rhinitis Patient has been on allergy shots for approximately eight years and has been on maintenance for over five years. Reports significant improvement in symptoms and only occasional use of Flonase. Patient has expressed a desire to discontinue allergy shots. -Discontinue allergy shots as per patient's preference and completion of greater than 5 years of maintenance (recommendation from Allergen Immunotherapy Practice Parameter) -Continue use of Flonase as needed for symptom control. -Advised patient that if symptoms return after discontinuation of allergy shots, re-initiation of therapy may require starting from the beginning of the treatment protocol.  Follow up : yearly or as needed It was a pleasure meeting you in clinic today! Thank you for allowing me to participate in your care.  Tonny Bollman, MD [facility]

## 2023-07-28 NOTE — Progress Notes (Signed)
FOLLOW UP Date of Service/Encounter:  07/28/23  Subjective:  Tyler [name] (DOB: 13-Mar-1946) is a 77 y.o. male who [facility] in re-evaluation of the following: allergic rhinoconjunctivitis.  History obtained from: chart review and patient.  For Review, LV was on 12/11/21  with Dr. Lucie Leather seen for routine follow-up. See below for summary of history and diagnostics.   Therapeutic plans/changes recommended: continue on AIT q4 weeks.  Reported still occasionally uses some Flonase and antihistamine and Mucinex during the spring and fall  ----------------------------------------------------- Pertinent History/Diagnostics:  Allergic Rhinitis:  On AIT, received 3 vials (vial 1: W/T and vial 2: Mold/CR and vial 3 (G/DM/C/D). Started 08/10/15. Reached maintenance in April 2018 --------------------------------------------------- Today presents for follow-up. Discussed the use of AI scribe software for clinical note transcription with the patient, who gave verbal consent to proceed.  History of Present Illness   The patient, with a history of allergies, has been on allergy shots for approximately eight years, reaching the maintenance dose in April 2018. He reports feeling great and believes the shots have been beneficial. The patient uses Flonase occasionally, particularly during seasonal changes when he is more exposed to allergens due to outdoor activities such as horse riding and trail walking. He also reports exposure to molds, dogs, and cats.  The patient has been stung by bees multiple times in the past year due to his outdoor activities, but he did not have a severe allergic reaction. He initially believed this was due to the allergy shots, but was informed that his shots do not cover bee venom or horses.  The patient has been considering discontinuing the allergy shots, understanding that if he chooses to restart later, he may have to start  from the beginning depending on time lapsed. He reports not having a serious allergic reaction in years and feels he has received great benefit from the shots. Discussed and reviewed guidance from allergen immunotherapy practice parameters.      Chart Review: Last injection 07/24/23: given 0.1 mL of each vial (new vials), maintenance dosing of 0.45mL q4 weeks.  All medications reviewed by clinical staff and updated in chart. No new pertinent medical or surgical history except as noted in HPI.  ROS: All others negative except as noted per HPI.   Objective:  BP 118/70   Pulse 64   Temp 98 F (36.7 C) (Temporal)   Ht 5' 4.17" (1.63 m)   Wt 227 lb 1.6 oz (103 kg)   SpO2 96%   BMI 38.77 kg/m  Body mass index is 38.77 kg/m. Physical Exam: General Appearance:  Alert, cooperative, no distress, appears stated age  Head:  Normocephalic, without obvious abnormality, atraumatic  Eyes:  Conjunctiva clear, EOM's intact  Ears EACs normal bilaterally and normal TMs bilaterally  Nose: Nares normal, hypertrophic turbinates, normal mucosa, and no visible anterior polyps  Throat: Lips, tongue normal; teeth and gums normal, normal posterior oropharynx  Neck: Supple, symmetrical  Lungs:   clear to auscultation bilaterally, Respirations unlabored, no coughing  Heart:  regular rate and rhythm and no murmur, Appears well perfused  Extremities: No edema  Skin: Skin color, texture, turgor normal and no rashes or lesions on visualized portions of skin  Neurologic: No gross deficits   Labs:  Lab Orders  No laboratory test(s) ordered today   Assessment/Plan   Allergic Rhinitis Patient has been on allergy shots for approximately eight years and has been on maintenance for over five years.  Reports significant improvement in symptoms and only occasional use of Flonase. Patient has expressed a desire to discontinue allergy shots. -Discontinue allergy shots as per patient's preference and completion of  greater than 5 years of maintenance (recommendation from Allergen Immunotherapy Practice Parameter) -Continue use of Flonase as needed for symptom control. -Advised patient that if symptoms return after discontinuation of allergy shots, re-initiation of therapy may require starting from the beginning of the treatment protocol.  Follow up : yearly or as needed It was a pleasure meeting you in clinic today! Thank you for allowing me to participate in your care  Other:  signed allergy injection discontinuation forms  Tonny Bollman, MD  [facility] [place]

## 2024-01-12 ENCOUNTER — Other Ambulatory Visit: Payer: Self-pay | Admitting: Internal Medicine

## 2024-01-12 ENCOUNTER — Ambulatory Visit
Admission: RE | Admit: 2024-01-12 | Discharge: 2024-01-12 | Disposition: A | Discharge status: Home / Self Care | Source: Ambulatory Visit | Attending: Internal Medicine | Admitting: Internal Medicine

## 2024-01-12 DIAGNOSIS — R059 Cough, unspecified: Secondary | ICD-10-CM
